# Patient Record
Sex: Female | Born: 1955
Health system: Southern US, Community
[De-identification: ages and names within clinical notes are randomized; demographics above are authoritative.]

## PROBLEM LIST (undated history)

## (undated) DIAGNOSIS — Z9289 Personal history of other medical treatment: Secondary | ICD-10-CM

## (undated) DIAGNOSIS — E039 Hypothyroidism, unspecified: Secondary | ICD-10-CM

## (undated) DIAGNOSIS — I10 Essential (primary) hypertension: Secondary | ICD-10-CM

## (undated) DIAGNOSIS — E079 Disorder of thyroid, unspecified: Secondary | ICD-10-CM

## (undated) DIAGNOSIS — M1990 Unspecified osteoarthritis, unspecified site: Secondary | ICD-10-CM

## (undated) DIAGNOSIS — C50919 Malignant neoplasm of unspecified site of unspecified female breast: Secondary | ICD-10-CM

## (undated) HISTORY — PX: ABDOMINAL HYSTERECTOMY: SHX81

## (undated) HISTORY — DX: Disorder of thyroid, unspecified: E07.9

## (undated) HISTORY — PX: THYROIDECTOMY: SHX17

## (undated) HISTORY — DX: Essential (primary) hypertension: I10

## (undated) HISTORY — PX: COLONOSCOPY: SHX174

---

## 2000-05-24 ENCOUNTER — Encounter: Payer: Self-pay | Admitting: Emergency Medicine

## 2000-05-24 ENCOUNTER — Ambulatory Visit (HOSPITAL_COMMUNITY): Admission: RE | Admit: 2000-05-24 | Discharge: 2000-05-24 | Payer: Self-pay | Admitting: Emergency Medicine

## 2001-06-14 ENCOUNTER — Ambulatory Visit (HOSPITAL_COMMUNITY): Admission: RE | Admit: 2001-06-14 | Discharge: 2001-06-14 | Payer: Self-pay | Admitting: Emergency Medicine

## 2001-06-14 ENCOUNTER — Encounter: Payer: Self-pay | Admitting: Emergency Medicine

## 2003-02-12 ENCOUNTER — Ambulatory Visit (HOSPITAL_COMMUNITY): Admission: RE | Admit: 2003-02-12 | Discharge: 2003-02-12 | Payer: Self-pay | Admitting: Emergency Medicine

## 2007-05-28 ENCOUNTER — Ambulatory Visit (HOSPITAL_COMMUNITY): Admission: RE | Admit: 2007-05-28 | Discharge: 2007-05-28 | Payer: Self-pay | Admitting: Emergency Medicine

## 2008-09-05 ENCOUNTER — Ambulatory Visit (HOSPITAL_COMMUNITY): Admission: RE | Admit: 2008-09-05 | Discharge: 2008-09-05 | Payer: Self-pay | Admitting: Obstetrics and Gynecology

## 2009-11-20 ENCOUNTER — Ambulatory Visit (HOSPITAL_COMMUNITY): Admission: RE | Admit: 2009-11-20 | Discharge: 2009-11-20 | Payer: Self-pay | Admitting: Internal Medicine

## 2009-12-07 ENCOUNTER — Encounter: Admission: RE | Admit: 2009-12-07 | Discharge: 2009-12-07 | Payer: Self-pay

## 2010-03-21 ENCOUNTER — Encounter: Payer: Self-pay | Admitting: Emergency Medicine

## 2011-09-21 ENCOUNTER — Other Ambulatory Visit (HOSPITAL_COMMUNITY): Payer: Self-pay | Admitting: Family Medicine

## 2011-09-21 DIAGNOSIS — Z1231 Encounter for screening mammogram for malignant neoplasm of breast: Secondary | ICD-10-CM

## 2011-10-13 ENCOUNTER — Ambulatory Visit (HOSPITAL_COMMUNITY): Payer: Self-pay

## 2011-10-20 ENCOUNTER — Ambulatory Visit (HOSPITAL_COMMUNITY)
Admission: RE | Admit: 2011-10-20 | Discharge: 2011-10-20 | Disposition: A | Discharge status: Home / Self Care | Payer: 59 | Source: Ambulatory Visit | Attending: Family Medicine | Admitting: Family Medicine

## 2011-10-20 DIAGNOSIS — Z1231 Encounter for screening mammogram for malignant neoplasm of breast: Secondary | ICD-10-CM | POA: Insufficient documentation

## 2012-09-10 ENCOUNTER — Other Ambulatory Visit (HOSPITAL_COMMUNITY): Payer: Self-pay | Admitting: Family Medicine

## 2012-09-10 DIAGNOSIS — Z1231 Encounter for screening mammogram for malignant neoplasm of breast: Secondary | ICD-10-CM

## 2012-10-22 ENCOUNTER — Ambulatory Visit (HOSPITAL_COMMUNITY)
Admission: RE | Admit: 2012-10-22 | Discharge: 2012-10-22 | Disposition: A | Discharge status: Home / Self Care | Payer: 59 | Source: Ambulatory Visit | Attending: Family Medicine | Admitting: Family Medicine

## 2012-10-22 DIAGNOSIS — Z1231 Encounter for screening mammogram for malignant neoplasm of breast: Secondary | ICD-10-CM | POA: Insufficient documentation

## 2013-11-21 ENCOUNTER — Other Ambulatory Visit (HOSPITAL_COMMUNITY): Payer: Self-pay | Admitting: Family Medicine

## 2013-11-21 DIAGNOSIS — Z780 Asymptomatic menopausal state: Secondary | ICD-10-CM

## 2013-11-21 DIAGNOSIS — Z1231 Encounter for screening mammogram for malignant neoplasm of breast: Secondary | ICD-10-CM

## 2013-11-28 ENCOUNTER — Ambulatory Visit (HOSPITAL_COMMUNITY)
Admission: RE | Admit: 2013-11-28 | Discharge: 2013-11-28 | Disposition: A | Discharge status: Home / Self Care | Payer: 59 | Source: Ambulatory Visit | Attending: Family Medicine | Admitting: Family Medicine

## 2013-11-28 DIAGNOSIS — Z780 Asymptomatic menopausal state: Secondary | ICD-10-CM | POA: Diagnosis not present

## 2013-11-28 DIAGNOSIS — Z13820 Encounter for screening for osteoporosis: Secondary | ICD-10-CM | POA: Insufficient documentation

## 2013-11-28 DIAGNOSIS — Z1231 Encounter for screening mammogram for malignant neoplasm of breast: Secondary | ICD-10-CM | POA: Diagnosis present

## 2014-08-07 ENCOUNTER — Other Ambulatory Visit: Payer: Self-pay | Admitting: Family Medicine

## 2014-11-24 ENCOUNTER — Encounter: Payer: Self-pay | Admitting: Family Medicine

## 2014-12-22 ENCOUNTER — Ambulatory Visit (INDEPENDENT_AMBULATORY_CARE_PROVIDER_SITE_OTHER): Payer: 59 | Admitting: Family Medicine

## 2014-12-22 ENCOUNTER — Other Ambulatory Visit: Payer: Self-pay

## 2014-12-22 ENCOUNTER — Encounter: Payer: Self-pay | Admitting: Family Medicine

## 2014-12-22 VITALS — BP 136/85 | HR 91 | Temp 99.2°F | Ht 64.5 in | Wt 283.6 lb

## 2014-12-22 DIAGNOSIS — E063 Autoimmune thyroiditis: Secondary | ICD-10-CM | POA: Insufficient documentation

## 2014-12-22 DIAGNOSIS — I10 Essential (primary) hypertension: Secondary | ICD-10-CM | POA: Diagnosis not present

## 2014-12-22 DIAGNOSIS — Z0000 Encounter for general adult medical examination without abnormal findings: Secondary | ICD-10-CM

## 2014-12-22 DIAGNOSIS — E039 Hypothyroidism, unspecified: Secondary | ICD-10-CM | POA: Diagnosis not present

## 2014-12-22 DIAGNOSIS — Z1231 Encounter for screening mammogram for malignant neoplasm of breast: Secondary | ICD-10-CM

## 2014-12-22 LAB — URINALYSIS, ROUTINE W REFLEX MICROSCOPIC
Bilirubin, UA: NEGATIVE
Glucose, UA: NEGATIVE
KETONES UA: NEGATIVE
Leukocytes, UA: NEGATIVE
NITRITE UA: NEGATIVE
Protein, UA: NEGATIVE
SPEC GRAV UA: 1.015 (ref 1.005–1.030)
Urobilinogen, Ur: 0.2 mg/dL (ref 0.2–1.0)
pH, UA: 7 (ref 5.0–7.5)

## 2014-12-22 LAB — MICROSCOPIC EXAMINATION: RBC MICROSCOPIC, UA: NONE SEEN /HPF (ref 0–?)

## 2014-12-22 MED ORDER — OLMESARTAN-AMLODIPINE-HCTZ 40-5-12.5 MG PO TABS: ORAL_TABLET | ORAL | Status: DC

## 2014-12-22 MED ORDER — MELOXICAM 15 MG PO TABS: ORAL_TABLET | ORAL | Status: DC

## 2014-12-22 MED ORDER — LEVOTHYROXINE SODIUM 75 MCG PO TABS: 75.0000 ug | ORAL_TABLET | Freq: Every day | ORAL | Status: DC

## 2014-12-22 NOTE — Progress Notes (Signed)
BP 136/85 mmHg  Pulse 91  Temp(Src) 99.2 F (37.3 C)  Ht 5' 4.5" (1.638 m)  Wt 283 lb 9.6 oz (128.64 kg)  BMI 47.95 kg/m2  SpO2 96%   Subjective:    Patient ID: Kerry Phillips, female    DOB: [birth year], 59 y.o.   MRN: [id number]  HPI: Kerry Phillips is a 59 y.o. female  Chief Complaint  Patient presents with  . Annual Exam   patient doing well with no complaints taking medications without problems blood pressure doing well  Relevant past medical, surgical, family and social history reviewed and updated as indicated. Interim medical history since our last visit reviewed. Allergies and medications reviewed and updated.  Review of Systems  Constitutional: Negative.   HENT: Negative.   Eyes: Negative.   Respiratory: Negative.   Cardiovascular: Negative.   Gastrointestinal: Negative.   Endocrine: Negative.   Genitourinary: Negative.   Musculoskeletal: Negative.   Skin: Negative.   Allergic/Immunologic: Negative.   Neurological: Negative.   Hematological: Negative.   Psychiatric/Behavioral: Negative.     Per HPI unless specifically indicated above     Objective:    BP 136/85 mmHg  Pulse 91  Temp(Src) 99.2 F (37.3 C)  Ht 5' 4.5" (1.638 m)  Wt 283 lb 9.6 oz (128.64 kg)  BMI 47.95 kg/m2  SpO2 96%  Wt Readings from Last 3 Encounters:  12/22/14 283 lb 9.6 oz (128.64 kg)  05/29/14 264 lb (119.75 kg)    Physical Exam  Constitutional: She is oriented to person, place, and time. She appears well-developed and well-nourished.  HENT:  Head: Normocephalic and atraumatic.  Right Ear: External ear normal.  Left Ear: External ear normal.  Nose: Nose normal.  Mouth/Throat: Oropharynx is clear and moist.  Eyes: Conjunctivae and EOM are normal. Pupils are equal, round, and reactive to light.  Neck: Normal range of motion. Neck supple. Carotid bruit is not present.  Cardiovascular: Normal rate, regular rhythm and normal heart sounds.   No murmur heard. Pulmonary/Chest:  Effort normal and breath sounds normal. She exhibits no mass. Right breast exhibits no mass, no skin change and no tenderness. Left breast exhibits no mass, no skin change and no tenderness. Breasts are symmetrical.  Abdominal: Soft. Bowel sounds are normal. There is no hepatosplenomegaly.  Musculoskeletal: Normal range of motion.  Neurological: She is alert and oriented to person, place, and time.  Skin: No rash noted.  Psychiatric: She has a normal mood and affect. Her behavior is normal. Judgment and thought content normal.    No results found for this or any previous visit.    Assessment & Plan:   Problem List Items Addressed This Visit      Cardiovascular and Mediastinum   Essential hypertension    The current medical regimen is effective;  continue present plan and medications.       Relevant Medications   Olmesartan-Amlodipine-HCTZ (TRIBENZOR) 40-5-12.5 MG TABS     Endocrine   Hypothyroidism    The current medical regimen is effective;  continue present plan and medications.       Relevant Medications   levothyroxine (SYNTHROID, LEVOTHROID) 75 MCG tablet    Other Visit Diagnoses    Routine general medical examination at a health care facility    -  Primary    Relevant Orders    CBC with Differential/Platelet    Comprehensive metabolic panel    Lipid Panel w/o Chol/HDL Ratio    TSH    Urinalysis, Routine  w reflex microscopic (not at [facility])    PE (physical exam), annual            Follow up plan: Return in about 6 months (around 06/22/2015), or if symptoms worsen or fail to improve, for 6 mo BMP.

## 2014-12-22 NOTE — Assessment & Plan Note (Signed)
The current medical regimen is effective;  continue present plan and medications.  

## 2014-12-23 LAB — COMPREHENSIVE METABOLIC PANEL
ALBUMIN: 4.3 g/dL (ref 3.6–4.8)
ALK PHOS: 46 IU/L (ref 39–117)
ALT: 11 IU/L (ref 0–32)
AST: 14 IU/L (ref 0–40)
Albumin/Globulin Ratio: 1.8 (ref 1.1–2.5)
BUN/Creatinine Ratio: 16 (ref 11–26)
BUN: 15 mg/dL (ref 8–27)
Bilirubin Total: 0.3 mg/dL (ref 0.0–1.2)
CALCIUM: 8.9 mg/dL (ref 8.7–10.3)
CO2: 25 mmol/L (ref 18–29)
CREATININE: 0.91 mg/dL (ref 0.57–1.00)
Chloride: 100 mmol/L (ref 97–106)
GFR calc Af Amer: 79 mL/min/{1.73_m2} (ref >59)
GFR calc non Af Amer: 69 mL/min/{1.73_m2} (ref >59)
GLUCOSE: 86 mg/dL (ref 65–99)
Globulin, Total: 2.4 g/dL (ref 1.5–4.5)
Potassium: 4.2 mmol/L (ref 3.5–5.2)
Sodium: 143 mmol/L (ref 136–144)
Total Protein: 6.7 g/dL (ref 6.0–8.5)

## 2014-12-23 LAB — CBC WITH DIFFERENTIAL/PLATELET
BASOS ABS: 0 10*3/uL (ref 0.0–0.2)
BASOS: 0 %
EOS (ABSOLUTE): 0.2 10*3/uL (ref 0.0–0.4)
EOS: 3 %
HEMOGLOBIN: 11.9 g/dL (ref 11.1–15.9)
Hematocrit: 35.6 % (ref 34.0–46.6)
IMMATURE GRANULOCYTES: 0 %
Immature Grans (Abs): 0 10*3/uL (ref 0.0–0.1)
Lymphocytes Absolute: 1.5 10*3/uL (ref 0.7–3.1)
Lymphs: 25 %
MCH: 28.7 pg (ref 26.6–33.0)
MCHC: 33.4 g/dL (ref 31.5–35.7)
MCV: 86 fL (ref 79–97)
Monocytes Absolute: 0.3 10*3/uL (ref 0.1–0.9)
Monocytes: 6 %
NEUTROS ABS: 3.9 10*3/uL (ref 1.4–7.0)
Neutrophils: 66 %
Platelets: 283 10*3/uL (ref 150–379)
RBC: 4.15 x10E6/uL (ref 3.77–5.28)
RDW: 15.2 % (ref 12.3–15.4)
WBC: 5.9 10*3/uL (ref 3.4–10.8)

## 2014-12-23 LAB — LIPID PANEL W/O CHOL/HDL RATIO
CHOLESTEROL TOTAL: 197 mg/dL (ref 100–199)
HDL: 63 mg/dL (ref >39)
LDL CALC: 122 mg/dL — AB (ref 0–99)
TRIGLYCERIDES: 61 mg/dL (ref 0–149)
VLDL CHOLESTEROL CAL: 12 mg/dL (ref 5–40)

## 2014-12-23 LAB — TSH: TSH: 5.79 u[IU]/mL — ABNORMAL HIGH (ref 0.450–4.500)

## 2014-12-25 ENCOUNTER — Telehealth: Payer: Self-pay | Admitting: Family Medicine

## 2014-12-25 DIAGNOSIS — R7989 Other specified abnormal findings of blood chemistry: Secondary | ICD-10-CM

## 2014-12-25 NOTE — Telephone Encounter (Signed)
Phone call Discussed with patient slight elevation of TSH will recheck TSH 1 month or so Patient with no thyroid symptoms

## 2014-12-25 NOTE — Telephone Encounter (Signed)
-----   Message from Lurlean HornsNancy H Wilson, CMA sent at 12/25/2014  5:19 PM EDT ----- labs

## 2015-01-07 ENCOUNTER — Ambulatory Visit
Admission: RE | Admit: 2015-01-07 | Discharge: 2015-01-07 | Disposition: A | Discharge status: Home / Self Care | Payer: 59 | Source: Ambulatory Visit

## 2015-01-07 DIAGNOSIS — Z1231 Encounter for screening mammogram for malignant neoplasm of breast: Secondary | ICD-10-CM

## 2015-02-16 ENCOUNTER — Other Ambulatory Visit: Payer: Self-pay | Admitting: Family Medicine

## 2015-06-08 MED FILL — LEVOTHYROXINE 75 MCG TABLET: 75 | 90 days supply | Qty: 90 | Fill #1

## 2015-06-08 MED FILL — CALCITRIOL 0.5 MCG CAPSULE: 0.5 | 90 days supply | Qty: 180 | Fill #1

## 2015-06-08 MED FILL — OLMSRTN-AMLDPN-HCTZ 40-5-12: 40-5-12.5 | 90 days supply | Qty: 90 | Fill #0

## 2015-06-22 ENCOUNTER — Ambulatory Visit: Payer: 59 | Admitting: Family Medicine

## 2015-07-28 ENCOUNTER — Encounter: Payer: Self-pay | Admitting: Family Medicine

## 2015-07-28 ENCOUNTER — Ambulatory Visit (INDEPENDENT_AMBULATORY_CARE_PROVIDER_SITE_OTHER): Payer: 59 | Admitting: Family Medicine

## 2015-07-28 VITALS — BP 131/85 | HR 94 | Temp 97.7°F | Ht 64.6 in | Wt 277.0 lb

## 2015-07-28 DIAGNOSIS — M545 Low back pain, unspecified: Secondary | ICD-10-CM

## 2015-07-28 DIAGNOSIS — I10 Essential (primary) hypertension: Secondary | ICD-10-CM | POA: Diagnosis not present

## 2015-07-28 DIAGNOSIS — E039 Hypothyroidism, unspecified: Secondary | ICD-10-CM

## 2015-07-28 DIAGNOSIS — R946 Abnormal results of thyroid function studies: Secondary | ICD-10-CM | POA: Diagnosis not present

## 2015-07-28 DIAGNOSIS — R7989 Other specified abnormal findings of blood chemistry: Secondary | ICD-10-CM

## 2015-07-28 NOTE — Progress Notes (Signed)
BP 131/85 mmHg  Pulse 94  Temp(Src) 97.7 F (36.5 C)  Ht 5' 4.6" (1.641 m)  Wt 277 lb (125.646 kg)  BMI 46.66 kg/m2  SpO2 99%   Subjective:    Patient ID: Kerry Phillips, female    DOB: 12-14-1955, 60 y.o.   MRN: [id number]  HPI: Kerry Phillips is a 60 y.o. female  Chief Complaint  Patient presents with  . Hypothyroidism  . Hypertension    all in all doing well taking troponins or without problems no side effects and good blood pressure control. Stable get medication at a reasonable price. Taking thyroid medicines without problems every day  Relevant past medical, surgical, family and social history reviewed and updated as indicated. Interim medical history since our last visit reviewed. Allergies and medications reviewed and updated.  Review of Systems  Constitutional: Negative.   Respiratory: Negative.   Cardiovascular: Negative.   Musculoskeletal:       Has some low back stiffness especially with standing or on feet all day. Goes away with rest or changing activities. Maybe some slight radiation down into left leg otherwise clear no blood in stool or urine.    Per HPI unless specifically indicated above     Objective:    BP 131/85 mmHg  Pulse 94  Temp(Src) 97.7 F (36.5 C)  Ht 5' 4.6" (1.641 m)  Wt 277 lb (125.646 kg)  BMI 46.66 kg/m2  SpO2 99%  Wt Readings from Last 3 Encounters:  07/28/15 277 lb (125.646 kg)  12/22/14 283 lb 9.6 oz (128.64 kg)  05/29/14 264 lb (119.75 kg)    Physical Exam  Constitutional: She is oriented to person, place, and time. She appears well-developed and well-nourished. No distress.  HENT:  Head: Normocephalic and atraumatic.  Right Ear: Hearing normal.  Left Ear: Hearing normal.  Nose: Nose normal.  Eyes: Conjunctivae and lids are normal. Right eye exhibits no discharge. Left eye exhibits no discharge. No scleral icterus.  Cardiovascular: Normal rate, regular rhythm and normal heart sounds.   Pulmonary/Chest: Effort normal  and breath sounds normal. No respiratory distress.  Musculoskeletal: Normal range of motion.  Back exam normal with normal neurological exam and strength of legs  Neurological: She is alert and oriented to person, place, and time.  Skin: Skin is intact. No rash noted.  Psychiatric: She has a normal mood and affect. Her speech is normal and behavior is normal. Judgment and thought content normal. Cognition and memory are normal.    Results for orders placed or performed in visit on 12/22/14  Microscopic Examination  Result Value Ref Range   WBC, UA 0-5 0 -  5 /hpf   RBC, UA None seen 0 -  2 /hpf   Epithelial Cells (non renal) 0-10 0 - 10 /hpf   Bacteria, UA Few None seen/Few  CBC with Differential/Platelet  Result Value Ref Range   WBC 5.9 3.4 - 10.8 x10E3/uL   RBC 4.15 3.77 - 5.28 x10E6/uL   Hemoglobin 11.9 11.1 - 15.9 g/dL   Hematocrit 82.9 56.2 - 46.6 %   MCV 86 79 - 97 fL   MCH 28.7 26.6 - 33.0 pg   MCHC 33.4 31.5 - 35.7 g/dL   RDW 13.0 86.5 - 78.4 %   Platelets 283 150 - 379 x10E3/uL   Neutrophils 66 %   Lymphs 25 %   Monocytes 6 %   Eos 3 %   Basos 0 %   Neutrophils Absolute 3.9 1.4 -  7.0 x10E3/uL   Lymphocytes Absolute 1.5 0.7 - 3.1 x10E3/uL   Monocytes Absolute 0.3 0.1 - 0.9 x10E3/uL   EOS (ABSOLUTE) 0.2 0.0 - 0.4 x10E3/uL   Basophils Absolute 0.0 0.0 - 0.2 x10E3/uL   Immature Granulocytes 0 %   Immature Grans (Abs) 0.0 0.0 - 0.1 x10E3/uL  Comprehensive metabolic panel  Result Value Ref Range   Glucose 86 65 - 99 mg/dL   BUN 15 8 - 27 mg/dL   Creatinine, Ser 1.610.91 0.57 - 1.00 mg/dL   GFR calc non Af Amer 69 >59 mL/min/1.73   GFR calc Af Amer 79 >59 mL/min/1.73   BUN/Creatinine Ratio 16 11 - 26   Sodium 143 136 - 144 mmol/L   Potassium 4.2 3.5 - 5.2 mmol/L   Chloride 100 97 - 106 mmol/L   CO2 25 18 - 29 mmol/L   Calcium 8.9 8.7 - 10.3 mg/dL   Total Protein 6.7 6.0 - 8.5 g/dL   Albumin 4.3 3.6 - 4.8 g/dL   Globulin, Total 2.4 1.5 - 4.5 g/dL   Albumin/Globulin  Ratio 1.8 1.1 - 2.5   Bilirubin Total 0.3 0.0 - 1.2 mg/dL   Alkaline Phosphatase 46 39 - 117 IU/L   AST 14 0 - 40 IU/L   ALT 11 0 - 32 IU/L  Lipid Panel w/o Chol/HDL Ratio  Result Value Ref Range   Cholesterol, Total 197 100 - 199 mg/dL   Triglycerides 61 0 - 149 mg/dL   HDL 63 >09>39 mg/dL   VLDL Cholesterol Cal 12 5 - 40 mg/dL   LDL Calculated 604122 (H) 0 - 99 mg/dL  TSH  Result Value Ref Range   TSH 5.790 (H) 0.450 - 4.500 uIU/mL  Urinalysis, Routine w reflex microscopic (not at [facility])  Result Value Ref Range   Specific Gravity, UA 1.015 1.005 - 1.030   pH, UA 7.0 5.0 - 7.5   Color, UA Yellow Yellow   Appearance Ur Clear Clear   Leukocytes, UA Negative Negative   Protein, UA Negative Negative/Trace   Glucose, UA Negative Negative   Ketones, UA Negative Negative   RBC, UA Trace (A) Negative   Bilirubin, UA Negative Negative   Urobilinogen, Ur 0.2 0.2 - 1.0 mg/dL   Nitrite, UA Negative Negative   Microscopic Examination See below:       Assessment & Plan:   Problem List Items Addressed This Visit      Cardiovascular and Mediastinum   Essential hypertension    The current medical regimen is effective;  continue present plan and medications.       Relevant Orders   Basic metabolic panel     Endocrine   Hypothyroidism - Primary    The current medical regimen is effective;  continue present plan and medications.        Other Visit Diagnoses    Elevated TSH        Bilateral low back pain without sciatica        Discuss back pain care and treatment exercises movement maybe occasional ibuprofen        Follow up plan: Return in about 6 months (around 01/28/2016) for Physical Exam.

## 2015-07-28 NOTE — Assessment & Plan Note (Signed)
The current medical regimen is effective;  continue present plan and medications.  

## 2015-07-29 ENCOUNTER — Encounter: Payer: Self-pay | Admitting: Family Medicine

## 2015-07-29 LAB — BASIC METABOLIC PANEL
BUN / CREAT RATIO: 15 (ref 12–28)
BUN: 14 mg/dL (ref 8–27)
CHLORIDE: 99 mmol/L (ref 96–106)
CO2: 26 mmol/L (ref 18–29)
CREATININE: 0.94 mg/dL (ref 0.57–1.00)
Calcium: 8.9 mg/dL (ref 8.7–10.3)
GFR, EST AFRICAN AMERICAN: 76 mL/min/{1.73_m2} (ref >59)
GFR, EST NON AFRICAN AMERICAN: 66 mL/min/{1.73_m2} (ref >59)
Glucose: 84 mg/dL (ref 65–99)
Potassium: 4 mmol/L (ref 3.5–5.2)
Sodium: 141 mmol/L (ref 134–144)

## 2015-07-29 LAB — TSH: TSH: 3.98 u[IU]/mL (ref 0.450–4.500)

## 2015-09-15 ENCOUNTER — Other Ambulatory Visit: Payer: Self-pay | Admitting: Family Medicine

## 2015-09-15 MED FILL — LEVOTHYROXINE 75 MCG TABLET: 75 | 90 days supply | Qty: 90 | Fill #2

## 2015-09-15 MED FILL — OLMSRTN-AMLDPN-HCTZ 40-5-12: 40-5-12.5 | 90 days supply | Qty: 90 | Fill #1 | Status: TO

## 2015-09-15 MED FILL — CALCITRIOL 0.5 MCG CAPSULE: 0.5 | 90 days supply | Qty: 180 | Fill #0

## 2015-11-17 ENCOUNTER — Encounter (INDEPENDENT_AMBULATORY_CARE_PROVIDER_SITE_OTHER): Payer: Self-pay

## 2015-12-10 ENCOUNTER — Encounter: Payer: 59 | Admitting: Family Medicine

## 2015-12-18 MED FILL — CALCITRIOL 0.5 MCG CAPSULE: 0.5 | 90 days supply | Qty: 180 | Fill #1

## 2015-12-18 MED FILL — LEVOTHYROXINE 75 MCG TABLET: 75 | 90 days supply | Qty: 90 | Fill #3

## 2015-12-18 MED FILL — OLMSRTN-AMLDPN-HCTZ 40-5-12: 40-5-12.5 | 90 days supply | Qty: 90 | Fill #0

## 2016-01-19 ENCOUNTER — Encounter: Payer: Self-pay | Admitting: Family Medicine

## 2016-03-21 ENCOUNTER — Encounter: Payer: 59 | Admitting: Family Medicine

## 2016-03-22 ENCOUNTER — Other Ambulatory Visit: Payer: Self-pay | Admitting: Family Medicine

## 2016-03-22 DIAGNOSIS — Z1231 Encounter for screening mammogram for malignant neoplasm of breast: Secondary | ICD-10-CM

## 2016-03-30 ENCOUNTER — Other Ambulatory Visit: Payer: Self-pay

## 2016-03-30 MED ORDER — OLMESARTAN-AMLODIPINE-HCTZ 40-5-12.5 MG PO TABS: ORAL_TABLET | ORAL | 0 refills | Status: DC

## 2016-03-30 MED ORDER — LEVOTHYROXINE SODIUM 75 MCG PO TABS: 75.0000 ug | ORAL_TABLET | Freq: Every day | ORAL | 4 refills | Status: DC

## 2016-03-30 MED FILL — OLMSRTN-AMLDPN-HCTZ 40-5-12: 40-5-12.5 | 90 days supply | Qty: 90 | Fill #0

## 2016-03-30 MED FILL — CALCITRIOL 0.5 MCG CAPSULE: 0.5 | 90 days supply | Qty: 180 | Fill #2

## 2016-03-30 MED FILL — LEVOTHYROXINE 75 MCG TABLET: 75 | 90 days supply | Qty: 90 | Fill #0

## 2016-03-30 NOTE — Telephone Encounter (Signed)
Last OV: 07/28/15 Next OV: 04/05/16  Lab Results  Component Value Date   TSH 3.980 07/28/2015    BMP Latest Ref Rng & Units 07/28/2015 12/22/2014  Glucose 65 - 99 mg/dL 84 86  BUN 8 - 27 mg/dL 14 15  Creatinine 1.610.57 - 1.00 mg/dL 0.960.94 0.450.91  BUN/Creat Ratio 12 - 28 15 16   Sodium 134 - 144 mmol/L 141 143  Potassium 3.5 - 5.2 mmol/L 4.0 4.2  Chloride 96 - 106 mmol/L 99 100  CO2 18 - 29 mmol/L 26 25  Calcium 8.7 - 10.3 mg/dL 8.9 8.9

## 2016-04-05 ENCOUNTER — Ambulatory Visit (INDEPENDENT_AMBULATORY_CARE_PROVIDER_SITE_OTHER): Payer: 59 | Admitting: Family Medicine

## 2016-04-05 ENCOUNTER — Encounter: Payer: Self-pay | Admitting: Family Medicine

## 2016-04-05 VITALS — BP 138/70 | HR 90 | Ht 63.39 in | Wt 290.0 lb

## 2016-04-05 DIAGNOSIS — Z13220 Encounter for screening for lipoid disorders: Secondary | ICD-10-CM

## 2016-04-05 DIAGNOSIS — Z0000 Encounter for general adult medical examination without abnormal findings: Secondary | ICD-10-CM | POA: Diagnosis not present

## 2016-04-05 DIAGNOSIS — Z1159 Encounter for screening for other viral diseases: Secondary | ICD-10-CM | POA: Diagnosis not present

## 2016-04-05 DIAGNOSIS — M170 Bilateral primary osteoarthritis of knee: Secondary | ICD-10-CM | POA: Diagnosis not present

## 2016-04-05 DIAGNOSIS — Z114 Encounter for screening for human immunodeficiency virus [HIV]: Secondary | ICD-10-CM

## 2016-04-05 DIAGNOSIS — Z6841 Body Mass Index (BMI) 40.0 and over, adult: Secondary | ICD-10-CM | POA: Insufficient documentation

## 2016-04-05 DIAGNOSIS — I10 Essential (primary) hypertension: Secondary | ICD-10-CM

## 2016-04-05 DIAGNOSIS — Z23 Encounter for immunization: Secondary | ICD-10-CM

## 2016-04-05 DIAGNOSIS — E039 Hypothyroidism, unspecified: Secondary | ICD-10-CM

## 2016-04-05 LAB — URINALYSIS, ROUTINE W REFLEX MICROSCOPIC
Bilirubin, UA: NEGATIVE
Glucose, UA: NEGATIVE
Ketones, UA: NEGATIVE
LEUKOCYTES UA: NEGATIVE
NITRITE UA: NEGATIVE
PH UA: 7 (ref 5.0–7.5)
Protein, UA: NEGATIVE
RBC UA: NEGATIVE
SPEC GRAV UA: 1.015 (ref 1.005–1.030)
Urobilinogen, Ur: 0.2 mg/dL (ref 0.2–1.0)

## 2016-04-05 MED ORDER — OLMESARTAN-AMLODIPINE-HCTZ 40-5-12.5 MG PO TABS: ORAL_TABLET | ORAL | 4 refills | Status: DC

## 2016-04-05 NOTE — Assessment & Plan Note (Signed)
Discussed care and treatment of arthritis use of medications and weight loss

## 2016-04-05 NOTE — Progress Notes (Signed)
BP 138/70 (BP Location: Left Arm)   Pulse 90   Ht 5' 3.39" (1.61 m)   Wt 290 lb (131.5 kg)   SpO2 98%   BMI 50.75 kg/m    Subjective:    Patient ID: Kerry Phillips, female    DOB: 09-28-1955, 61 y.o.   MRN: [id number]  HPI: Kerry Phillips is a 61 y.o. female  Chief Complaint  Patient presents with  . Annual Exam  Patient all in all doing well has knee arthritis wondering about the special need clinics and whether they may or may not help. Patient taking thyroid medication faithfully without problems Blood pressure doing well without issues and takes medication without side effects and faithfully.  Relevant past medical, surgical, family and social history reviewed and updated as indicated. Interim medical history since our last visit reviewed. Allergies and medications reviewed and updated.  Review of Systems  Constitutional: Negative.   HENT: Negative.   Eyes: Negative.   Respiratory: Negative.   Cardiovascular: Negative.   Gastrointestinal: Negative.   Endocrine: Negative.   Genitourinary: Negative.   Musculoskeletal: Negative.   Skin: Negative.   Allergic/Immunologic: Negative.   Neurological: Negative.   Hematological: Negative.   Psychiatric/Behavioral: Negative.     Per HPI unless specifically indicated above     Objective:    BP 138/70 (BP Location: Left Arm)   Pulse 90   Ht 5' 3.39" (1.61 m)   Wt 290 lb (131.5 kg)   SpO2 98%   BMI 50.75 kg/m   Wt Readings from Last 3 Encounters:  04/05/16 290 lb (131.5 kg)  07/28/15 277 lb (125.6 kg)  12/22/14 283 lb 9.6 oz (128.6 kg)    Physical Exam  Constitutional: She is oriented to person, place, and time. She appears well-developed and well-nourished.  HENT:  Head: Normocephalic and atraumatic.  Right Ear: External ear normal.  Left Ear: External ear normal.  Nose: Nose normal.  Mouth/Throat: Oropharynx is clear and moist.  Eyes: Conjunctivae and EOM are normal. Pupils are equal, round, and reactive to  light.  Neck: Normal range of motion. Neck supple. Carotid bruit is not present.  Cardiovascular: Normal rate, regular rhythm and normal heart sounds.   No murmur heard. Pulmonary/Chest: Effort normal and breath sounds normal. She exhibits no mass. Right breast exhibits no mass, no skin change and no tenderness. Left breast exhibits no mass, no skin change and no tenderness. Breasts are symmetrical.  Abdominal: Soft. Bowel sounds are normal. There is no hepatosplenomegaly.  Musculoskeletal: Normal range of motion.  Neurological: She is alert and oriented to person, place, and time.  Skin: No rash noted.  Psychiatric: She has a normal mood and affect. Her behavior is normal. Judgment and thought content normal.        Assessment & Plan:   Problem List Items Addressed This Visit      Cardiovascular and Mediastinum   Essential hypertension   Relevant Orders   CBC with Differential/Platelet   Comprehensive metabolic panel   Urinalysis, Routine w reflex microscopic     Endocrine   Hypothyroidism   Relevant Orders   CBC with Differential/Platelet   Comprehensive metabolic panel   TSH   Urinalysis, Routine w reflex microscopic     Musculoskeletal and Integument   Arthritis of both knees    Discussed care and treatment of arthritis use of medications and weight loss        Other   BMI 50.0-59.9, adult (HCC)  Discussed diet exercise nutrition importance of weight loss       Other Visit Diagnoses    Annual physical exam    -  Primary   Relevant Orders   CBC with Differential/Platelet   Comprehensive metabolic panel   Hepatitis C antibody   HIV antibody   Lipid panel   TSH   Urinalysis, Routine w reflex microscopic   Need for hepatitis C screening test       Relevant Orders   Hepatitis C antibody   Encounter for screening for HIV       Relevant Orders   HIV antibody   Screening cholesterol level       Relevant Orders   Lipid panel   Need for Zostavax  administration       Need for influenza vaccination           Follow up plan: Return for BMP.

## 2016-04-05 NOTE — Assessment & Plan Note (Signed)
Discussed diet exercise nutrition importance of weight loss

## 2016-04-06 ENCOUNTER — Telehealth: Payer: Self-pay | Admitting: Family Medicine

## 2016-04-06 DIAGNOSIS — R7989 Other specified abnormal findings of blood chemistry: Secondary | ICD-10-CM

## 2016-04-06 LAB — CBC WITH DIFFERENTIAL/PLATELET
BASOS: 1 %
Basophils Absolute: 0 10*3/uL (ref 0.0–0.2)
EOS (ABSOLUTE): 0.2 10*3/uL (ref 0.0–0.4)
EOS: 2 %
HEMATOCRIT: 35.5 % (ref 34.0–46.6)
Hemoglobin: 12.1 g/dL (ref 11.1–15.9)
IMMATURE GRANS (ABS): 0 10*3/uL (ref 0.0–0.1)
Immature Granulocytes: 0 %
LYMPHS: 26 %
Lymphocytes Absolute: 1.7 10*3/uL (ref 0.7–3.1)
MCH: 27.8 pg (ref 26.6–33.0)
MCHC: 34.1 g/dL (ref 31.5–35.7)
MCV: 82 fL (ref 79–97)
Monocytes Absolute: 0.4 10*3/uL (ref 0.1–0.9)
Monocytes: 5 %
NEUTROS ABS: 4.4 10*3/uL (ref 1.4–7.0)
Neutrophils: 66 %
PLATELETS: 331 10*3/uL (ref 150–379)
RBC: 4.35 x10E6/uL (ref 3.77–5.28)
RDW: 15 % (ref 12.3–15.4)
WBC: 6.7 10*3/uL (ref 3.4–10.8)

## 2016-04-06 LAB — COMPREHENSIVE METABOLIC PANEL
A/G RATIO: 1.8 (ref 1.2–2.2)
ALBUMIN: 4.6 g/dL (ref 3.6–4.8)
ALT: 10 IU/L (ref 0–32)
AST: 12 IU/L (ref 0–40)
Alkaline Phosphatase: 53 IU/L (ref 39–117)
BILIRUBIN TOTAL: 0.3 mg/dL (ref 0.0–1.2)
BUN / CREAT RATIO: 17 (ref 12–28)
BUN: 16 mg/dL (ref 8–27)
CALCIUM: 9 mg/dL (ref 8.7–10.3)
CHLORIDE: 100 mmol/L (ref 96–106)
CO2: 26 mmol/L (ref 18–29)
Creatinine, Ser: 0.96 mg/dL (ref 0.57–1.00)
GFR, EST AFRICAN AMERICAN: 74 mL/min/{1.73_m2} (ref >59)
GFR, EST NON AFRICAN AMERICAN: 64 mL/min/{1.73_m2} (ref >59)
GLOBULIN, TOTAL: 2.5 g/dL (ref 1.5–4.5)
Glucose: 100 mg/dL — ABNORMAL HIGH (ref 65–99)
POTASSIUM: 4.1 mmol/L (ref 3.5–5.2)
SODIUM: 143 mmol/L (ref 134–144)
TOTAL PROTEIN: 7.1 g/dL (ref 6.0–8.5)

## 2016-04-06 LAB — HEPATITIS C ANTIBODY: Hep C Virus Ab: <0.1 s/co ratio (ref 0.0–0.9)

## 2016-04-06 LAB — LIPID PANEL
CHOL/HDL RATIO: 3.8 ratio (ref 0.0–4.4)
Cholesterol, Total: 215 mg/dL — ABNORMAL HIGH (ref 100–199)
HDL: 57 mg/dL (ref >39)
LDL CALC: 142 mg/dL — AB (ref 0–99)
Triglycerides: 82 mg/dL (ref 0–149)
VLDL CHOLESTEROL CAL: 16 mg/dL (ref 5–40)

## 2016-04-06 LAB — HIV ANTIBODY (ROUTINE TESTING W REFLEX): HIV Screen 4th Generation wRfx: NONREACTIVE

## 2016-04-06 LAB — TSH: TSH: 8.83 u[IU]/mL — AB (ref 0.450–4.500)

## 2016-04-06 NOTE — Telephone Encounter (Signed)
Phone call Discussed with weight loss cholesterol will get better. Discussed TSH elevated will recheck again in a couple of months.

## 2016-04-28 ENCOUNTER — Ambulatory Visit: Payer: 59

## 2016-05-20 ENCOUNTER — Ambulatory Visit
Admission: RE | Admit: 2016-05-20 | Discharge: 2016-05-20 | Disposition: A | Discharge status: Home / Self Care | Payer: 59 | Source: Ambulatory Visit | Attending: Family Medicine | Admitting: Family Medicine

## 2016-05-20 DIAGNOSIS — Z1231 Encounter for screening mammogram for malignant neoplasm of breast: Secondary | ICD-10-CM | POA: Diagnosis not present

## 2016-07-08 MED FILL — LEVOTHYROXINE 75 MCG TABLET: 75 | 90 days supply | Qty: 90 | Fill #1

## 2016-07-08 MED FILL — OLMSRTN-AMLDPN-HCTZ 40-5-12: 40-5-12.5 | 90 days supply | Qty: 90 | Fill #0

## 2016-07-08 MED FILL — CALCITRIOL 0.5 MCG CAPSULE: 0.5 | 90 days supply | Qty: 180 | Fill #3

## 2016-09-09 ENCOUNTER — Encounter: Payer: Self-pay | Admitting: Family Medicine

## 2016-10-04 ENCOUNTER — Ambulatory Visit: Payer: 59 | Admitting: Family Medicine

## 2016-10-10 MED FILL — OLMSRTN-AMLDPN-HCTZ 40-5-12: 40-5-12.5 | 30 days supply | Qty: 30 | Fill #1

## 2016-11-16 ENCOUNTER — Other Ambulatory Visit: Payer: Self-pay | Admitting: Family Medicine

## 2016-11-16 MED FILL — OLMSRTN-AMLDPN-HCTZ 40-5-12: 40-5-12.5 | 30 days supply | Qty: 30 | Fill #2

## 2016-11-16 MED FILL — CALCITRIOL 0.5 MCG CAPSULE: 0.5 | 90 days supply | Qty: 180 | Fill #0

## 2016-11-16 MED FILL — LEVOTHYROXINE 75 MCG TABLET: 75 | 90 days supply | Qty: 90 | Fill #2

## 2016-12-12 ENCOUNTER — Ambulatory Visit: Payer: 59 | Admitting: Family Medicine

## 2016-12-14 MED FILL — OLMSRTN-AMLDPN-HCTZ 40-5-12: 40-5-12.5 | 30 days supply | Qty: 30 | Fill #3

## 2016-12-29 ENCOUNTER — Ambulatory Visit: Payer: 59 | Admitting: Family Medicine

## 2017-01-12 MED FILL — OLMSRTN-AMLDPN-HCTZ 40-5-12: 40-5-12.5 | 30 days supply | Qty: 30 | Fill #4

## 2017-01-17 ENCOUNTER — Ambulatory Visit (INDEPENDENT_AMBULATORY_CARE_PROVIDER_SITE_OTHER): Payer: 59 | Admitting: Family Medicine

## 2017-01-17 ENCOUNTER — Encounter: Payer: Self-pay | Admitting: Family Medicine

## 2017-01-17 VITALS — BP 110/80 | HR 90 | Wt 284.0 lb

## 2017-01-17 DIAGNOSIS — R1310 Dysphagia, unspecified: Secondary | ICD-10-CM

## 2017-01-17 DIAGNOSIS — I10 Essential (primary) hypertension: Secondary | ICD-10-CM

## 2017-01-17 DIAGNOSIS — E039 Hypothyroidism, unspecified: Secondary | ICD-10-CM

## 2017-01-17 HISTORY — DX: Dysphagia, unspecified: R13.10

## 2017-01-17 MED ORDER — OMEPRAZOLE 20 MG PO CPDR: 20.0000 mg | DELAYED_RELEASE_CAPSULE | Freq: Every day | ORAL | 6 refills | Status: DC

## 2017-01-17 MED FILL — OMEPRAZOLE 20 MG CAP: 20 | 30 days supply | Qty: 30 | Fill #0

## 2017-01-17 NOTE — Assessment & Plan Note (Signed)
The current medical regimen is effective;  continue present plan and medications.  

## 2017-01-17 NOTE — Assessment & Plan Note (Signed)
Discussed dysphagia will try Prilosec first if no help May consider further evaluation.

## 2017-01-17 NOTE — Progress Notes (Signed)
BP 110/80   Pulse 90   Wt 284 lb (128.8 kg)   SpO2 99%   BMI 49.70 kg/m    Subjective:    Patient ID: Kerry BrooksBenita J Dasch, female    DOB: 01-06-1956, 61 y.o.   MRN: [id number]  HPI: Kerry Phillips is a 61 y.o. female  Chief Complaint  Patient presents with  . Follow-up  . Dysphagia  patient follow-up having some dysphasia  Symptoms mostly with water but also has noticed with other foods. Nothing seems to get stuck and can swallow okay but just some discomfort associated. Patient hasn't tried Prilosec or other reflux type medications. Takes her thyroid medication and blood pressure medication every day without problems. No noticed blood no cough  Relevant past medical, surgical, family and social history reviewed and updated as indicated. Interim medical history since our last visit reviewed. Allergies and medications reviewed and updated.  Review of Systems  Constitutional: Negative.   Respiratory: Negative.   Cardiovascular: Negative.     Per HPI unless specifically indicated above     Objective:    BP 110/80   Pulse 90   Wt 284 lb (128.8 kg)   SpO2 99%   BMI 49.70 kg/m   Wt Readings from Last 3 Encounters:  01/17/17 284 lb (128.8 kg)  04/05/16 290 lb (131.5 kg)  07/28/15 277 lb (125.6 kg)    Physical Exam  Constitutional: She is oriented to person, place, and time. She appears well-developed and well-nourished.  HENT:  Head: Normocephalic and atraumatic.  Eyes: Conjunctivae and EOM are normal.  Neck: Normal range of motion.  Cardiovascular: Normal rate, regular rhythm and normal heart sounds.  Pulmonary/Chest: Effort normal and breath sounds normal.  Musculoskeletal: Normal range of motion.  Neurological: She is alert and oriented to person, place, and time.  Skin: No erythema.  Psychiatric: She has a normal mood and affect. Her behavior is normal. Judgment and thought content normal.    Results for orders placed or performed in visit on 04/05/16  CBC with  Differential/Platelet  Result Value Ref Range   WBC 6.7 3.4 - 10.8 x10E3/uL   RBC 4.35 3.77 - 5.28 x10E6/uL   Hemoglobin 12.1 11.1 - 15.9 g/dL   Hematocrit 72.535.5 36.634.0 - 46.6 %   MCV 82 79 - 97 fL   MCH 27.8 26.6 - 33.0 pg   MCHC 34.1 31.5 - 35.7 g/dL   RDW 44.015.0 34.712.3 - 42.515.4 %   Platelets 331 150 - 379 x10E3/uL   Neutrophils 66 Not Estab. %   Lymphs 26 Not Estab. %   Monocytes 5 Not Estab. %   Eos 2 Not Estab. %   Basos 1 Not Estab. %   Neutrophils Absolute 4.4 1.4 - 7.0 x10E3/uL   Lymphocytes Absolute 1.7 0.7 - 3.1 x10E3/uL   Monocytes Absolute 0.4 0.1 - 0.9 x10E3/uL   EOS (ABSOLUTE) 0.2 0.0 - 0.4 x10E3/uL   Basophils Absolute 0.0 0.0 - 0.2 x10E3/uL   Immature Granulocytes 0 Not Estab. %   Immature Grans (Abs) 0.0 0.0 - 0.1 x10E3/uL  Comprehensive metabolic panel  Result Value Ref Range   Glucose 100 (H) 65 - 99 mg/dL   BUN 16 8 - 27 mg/dL   Creatinine, Ser 9.560.96 0.57 - 1.00 mg/dL   GFR calc non Af Amer 64 >59 mL/min/1.73   GFR calc Af Amer 74 >59 mL/min/1.73   BUN/Creatinine Ratio 17 12 - 28   Sodium 143 134 - 144 mmol/L  Potassium 4.1 3.5 - 5.2 mmol/L   Chloride 100 96 - 106 mmol/L   CO2 26 18 - 29 mmol/L   Calcium 9.0 8.7 - 10.3 mg/dL   Total Protein 7.1 6.0 - 8.5 g/dL   Albumin 4.6 3.6 - 4.8 g/dL   Globulin, Total 2.5 1.5 - 4.5 g/dL   Albumin/Globulin Ratio 1.8 1.2 - 2.2   Bilirubin Total 0.3 0.0 - 1.2 mg/dL   Alkaline Phosphatase 53 39 - 117 IU/L   AST 12 0 - 40 IU/L   ALT 10 0 - 32 IU/L  Hepatitis C antibody  Result Value Ref Range   Hep C Virus Ab <0.1 0.0 - 0.9 s/co ratio  HIV antibody  Result Value Ref Range   HIV Screen 4th Generation wRfx Non Reactive Non Reactive  Lipid panel  Result Value Ref Range   Cholesterol, Total 215 (H) 100 - 199 mg/dL   Triglycerides 82 0 - 149 mg/dL   HDL 57 >16>39 mg/dL   VLDL Cholesterol Cal 16 5 - 40 mg/dL   LDL Calculated 109142 (H) 0 - 99 mg/dL   Chol/HDL Ratio 3.8 0.0 - 4.4 ratio units  TSH  Result Value Ref Range   TSH  8.830 (H) 0.450 - 4.500 uIU/mL  Urinalysis, Routine w reflex microscopic  Result Value Ref Range   Specific Gravity, UA 1.015 1.005 - 1.030   pH, UA 7.0 5.0 - 7.5   Color, UA Yellow Yellow   Appearance Ur Clear Clear   Leukocytes, UA Negative Negative   Protein, UA Negative Negative/Trace   Glucose, UA Negative Negative   Ketones, UA Negative Negative   RBC, UA Negative Negative   Bilirubin, UA Negative Negative   Urobilinogen, Ur 0.2 0.2 - 1.0 mg/dL   Nitrite, UA Negative Negative      Assessment & Plan:   Problem List Items Addressed This Visit      Cardiovascular and Mediastinum   Essential hypertension - Primary    The current medical regimen is effective;  continue present plan and medications.       Relevant Orders   Basic metabolic panel     Digestive   Dysphagia    Discussed dysphagia will try Prilosec first if no help May consider further evaluation.        Endocrine   Hypothyroidism    TSH pending his last TSH slightly elevated may need to adjust dosing          Follow up plan: Return in about 3 months (around 04/19/2017) for Physical Exam.

## 2017-01-17 NOTE — Assessment & Plan Note (Signed)
TSH pending his last TSH slightly elevated may need to adjust dosing

## 2017-01-18 LAB — BASIC METABOLIC PANEL
BUN / CREAT RATIO: 15 (ref 12–28)
BUN: 15 mg/dL (ref 8–27)
CHLORIDE: 101 mmol/L (ref 96–106)
CO2: 26 mmol/L (ref 20–29)
CREATININE: 1 mg/dL (ref 0.57–1.00)
Calcium: 8.7 mg/dL (ref 8.7–10.3)
GFR calc Af Amer: 70 mL/min/{1.73_m2} (ref >59)
GFR calc non Af Amer: 61 mL/min/{1.73_m2} (ref >59)
GLUCOSE: 78 mg/dL (ref 65–99)
POTASSIUM: 3.3 mmol/L — AB (ref 3.5–5.2)
SODIUM: 141 mmol/L (ref 134–144)

## 2017-01-24 ENCOUNTER — Telehealth: Payer: Self-pay | Admitting: Family Medicine

## 2017-01-24 DIAGNOSIS — E876 Hypokalemia: Secondary | ICD-10-CM

## 2017-01-24 DIAGNOSIS — E039 Hypothyroidism, unspecified: Secondary | ICD-10-CM

## 2017-01-24 MED ORDER — LEVOTHYROXINE SODIUM 88 MCG PO TABS: 88.0000 ug | ORAL_TABLET | Freq: Every day | ORAL | 2 refills | Status: DC

## 2017-01-24 MED FILL — LEVOTHYROXINE 88 MCG TABLET: 88 | 30 days supply | Qty: 30 | Fill #0

## 2017-01-24 NOTE — Telephone Encounter (Signed)
Phone call Discussed with patient TSH elevated. Will increase levothyroxine from 75-88 g recheck TSH in a couple of months. Discuss potassium low patient will increase potassium rich foods recheck BMP and a month or so.

## 2017-01-25 LAB — TSH: TSH: 4.96 u[IU]/mL — ABNORMAL HIGH (ref 0.450–4.500)

## 2017-01-25 LAB — SPECIMEN STATUS REPORT

## 2017-02-16 MED FILL — OLMSRTN-AMLDPN-HCTZ 40-5-12: 40-5-12.5 | 30 days supply | Qty: 30 | Fill #5

## 2017-02-16 MED FILL — CALCITRIOL 0.5 MCG CAPS: 0.5 | 90 days supply | Qty: 180 | Fill #1

## 2017-03-22 MED FILL — LEVOTHYROXINE 88 MCG TABLET: 88 | 30 days supply | Qty: 30 | Fill #1

## 2017-03-22 MED FILL — OLMSRTN-AMLDPN-HCTZ 40-5-12: 40-5-12.5 | 30 days supply | Qty: 30 | Fill #6

## 2017-03-22 MED FILL — OMEPRAZOLE 20 MG CAP: 20 | 30 days supply | Qty: 30 | Fill #1

## 2017-04-06 ENCOUNTER — Encounter: Payer: Self-pay | Admitting: Family Medicine

## 2017-04-26 ENCOUNTER — Other Ambulatory Visit: Payer: Self-pay | Admitting: Family Medicine

## 2017-04-26 MED FILL — LEVOTHYROXINE 88 MCG TABLET: 88 | 30 days supply | Qty: 30 | Fill #2

## 2017-04-26 MED FILL — OMEPRAZOLE 20 MG CAP: 20 | 30 days supply | Qty: 30 | Fill #2

## 2017-04-28 MED FILL — OLMSRTN-AMLDPN-HCTZ 40-5-12: 40-5-12.5 | 30 days supply | Qty: 30 | Fill #0

## 2017-04-28 NOTE — Telephone Encounter (Signed)
Patient is checking on the status of this since Dr Dossie Arbourrissman is out of the office and she just wants to make sure she gets it before the weekend  [facility] [place] - [id number]

## 2017-05-02 ENCOUNTER — Encounter: Payer: 59 | Admitting: Family Medicine

## 2017-05-30 ENCOUNTER — Other Ambulatory Visit: Payer: Self-pay | Admitting: Family Medicine

## 2017-05-30 MED FILL — OMEPRAZOLE 20 MG CAP: 20 | 30 days supply | Qty: 30 | Fill #3

## 2017-05-30 MED FILL — OLMSRTN-AMLDPN-HCTZ 40-5-12: 40-5-12.5 | 30 days supply | Qty: 30 | Fill #1

## 2017-05-30 MED FILL — CALCITRIOL 0.5 MCG CAPS: 0.5 | 90 days supply | Qty: 180 | Fill #2

## 2017-05-31 MED FILL — LEVOTHYROXINE 88 MCG TABLET: 88 | 30 days supply | Qty: 30 | Fill #0

## 2017-06-06 ENCOUNTER — Encounter: Payer: 59 | Admitting: Family Medicine

## 2017-06-19 ENCOUNTER — Encounter: Payer: 59 | Admitting: Family Medicine

## 2017-06-28 MED FILL — OMEPRAZOLE 20 MG CAP: 20 | 30 days supply | Qty: 30 | Fill #4

## 2017-06-28 MED FILL — LEVOTHYROXINE 88 MCG TABLET: 88 | 30 days supply | Qty: 30 | Fill #1

## 2017-06-28 MED FILL — OLMSRTN-AMLDPN-HCTZ 40-5-12: 40-5-12.5 | 30 days supply | Qty: 30 | Fill #2

## 2017-07-27 MED FILL — OMEPRAZOLE 20 MG CAP: 20 | 30 days supply | Qty: 30 | Fill #5

## 2017-07-27 MED FILL — LEVOTHYROXINE 88 MCG TABLET: 88 | 30 days supply | Qty: 30 | Fill #2

## 2017-07-27 MED FILL — OLMSRTN-AMLDPN-HCTZ 40-5-12: 40-5-12.5 | 30 days supply | Qty: 30 | Fill #3

## 2017-08-24 ENCOUNTER — Other Ambulatory Visit: Payer: Self-pay | Admitting: Family Medicine

## 2017-08-24 MED FILL — CALCITRIOL 0.5 MCG CAPS: 0.5 | 90 days supply | Qty: 180 | Fill #3

## 2017-08-24 MED FILL — OLMSRTN-AMLDPN-HCTZ 40-5-12: 40-5-12.5 | 30 days supply | Qty: 30 | Fill #4

## 2017-08-24 MED FILL — OMEPRAZOLE 20 MG CAP: 20 | 30 days supply | Qty: 30 | Fill #6

## 2017-08-28 MED FILL — LEVOTHYROXINE 88 MCG TABLET: 88 | 30 days supply | Qty: 30 | Fill #0

## 2017-09-05 MED FILL — CHLORHEXIDINE 0.12% RINSE: 0.12 | 14 days supply | Qty: 473 | Fill #0

## 2017-09-05 MED FILL — IBUPROFEN 400 MG TABS: 400 | 5 days supply | Qty: 30 | Fill #0

## 2017-09-05 MED FILL — HYDROCODON-APAP 5-325: 5-325 | 1 days supply | Qty: 5 | Fill #0

## 2017-09-07 ENCOUNTER — Other Ambulatory Visit: Payer: Self-pay

## 2017-09-07 ENCOUNTER — Ambulatory Visit: Payer: 59 | Admitting: Family Medicine

## 2017-09-07 ENCOUNTER — Encounter

## 2017-09-07 ENCOUNTER — Encounter: Payer: Self-pay | Admitting: Family Medicine

## 2017-09-07 VITALS — BP 118/78 | HR 83 | Temp 97.4°F | Ht 64.5 in | Wt 285.5 lb

## 2017-09-07 DIAGNOSIS — E876 Hypokalemia: Secondary | ICD-10-CM | POA: Diagnosis not present

## 2017-09-07 DIAGNOSIS — R7989 Other specified abnormal findings of blood chemistry: Secondary | ICD-10-CM

## 2017-09-07 DIAGNOSIS — Z0000 Encounter for general adult medical examination without abnormal findings: Secondary | ICD-10-CM

## 2017-09-07 DIAGNOSIS — Z13220 Encounter for screening for lipoid disorders: Secondary | ICD-10-CM

## 2017-09-07 DIAGNOSIS — E039 Hypothyroidism, unspecified: Secondary | ICD-10-CM | POA: Diagnosis not present

## 2017-09-07 DIAGNOSIS — I10 Essential (primary) hypertension: Secondary | ICD-10-CM

## 2017-09-07 DIAGNOSIS — Z6841 Body Mass Index (BMI) 40.0 and over, adult: Secondary | ICD-10-CM

## 2017-09-07 DIAGNOSIS — R1310 Dysphagia, unspecified: Secondary | ICD-10-CM | POA: Diagnosis not present

## 2017-09-07 DIAGNOSIS — Z125 Encounter for screening for malignant neoplasm of prostate: Secondary | ICD-10-CM | POA: Diagnosis not present

## 2017-09-07 LAB — URINALYSIS, ROUTINE W REFLEX MICROSCOPIC
Bilirubin, UA: NEGATIVE
Glucose, UA: NEGATIVE
KETONES UA: NEGATIVE
LEUKOCYTES UA: NEGATIVE
Nitrite, UA: NEGATIVE
Protein, UA: NEGATIVE
RBC, UA: NEGATIVE
Specific Gravity, UA: 1.02 (ref 1.005–1.030)
UUROB: 0.2 mg/dL (ref 0.2–1.0)
pH, UA: 6 (ref 5.0–7.5)

## 2017-09-07 MED ORDER — OLMESARTAN-AMLODIPINE-HCTZ 40-5-12.5 MG PO TABS: ORAL_TABLET | ORAL | 4 refills | Status: DC

## 2017-09-07 MED ORDER — OMEPRAZOLE 20 MG PO CPDR: 20.0000 mg | DELAYED_RELEASE_CAPSULE | Freq: Every day | ORAL | 12 refills | Status: DC

## 2017-09-07 MED ORDER — LEVOTHYROXINE SODIUM 88 MCG PO TABS: 88.0000 ug | ORAL_TABLET | Freq: Every day | ORAL | 12 refills | Status: DC

## 2017-09-07 MED ORDER — CALCITRIOL 0.5 MCG PO CAPS: 0.5000 ug | ORAL_CAPSULE | Freq: Two times a day (BID) | ORAL | 12 refills | Status: DC

## 2017-09-07 NOTE — Progress Notes (Signed)
BP 118/78   Pulse 83   Temp (!) 97.4 F (36.3 C) (Tympanic)   Ht 5' 4.5" (1.638 m)   Wt 285 lb 8 oz (129.5 kg)   SpO2 98%   BMI 48.25 kg/m    Subjective:    Patient ID: Kerry Phillips, female    DOB: 10-Sep-1955, 62 y.o.   MRN: [id number]  HPI: Kerry Phillips is a 62 y.o. female  Chief Complaint  Patient presents with  . Annual Exam  Patient follow-up all in all doing well no complaints reviewed nerve questions all negative. Taking thyroid without problems and faithfully Blood pressure doing well with good controlled and no issues with medications. Reflux also doing well without problems.  Reviewed dysplasia complaints from last visit which have resolved by taking Prilosec. Also taking Calcitrol without problems.  Relevant past medical, surgical, family and social history reviewed and updated as indicated. Interim medical history since our last visit reviewed. Allergies and medications reviewed and updated.  Review of Systems  Constitutional: Negative.   HENT: Negative.   Eyes: Negative.   Respiratory: Negative.   Cardiovascular: Negative.   Gastrointestinal: Negative.   Endocrine: Negative.   Genitourinary: Negative.   Musculoskeletal: Negative.   Skin: Negative.   Allergic/Immunologic: Negative.   Neurological: Negative.   Hematological: Negative.   Psychiatric/Behavioral: Negative.     Per HPI unless specifically indicated above     Objective:    BP 118/78   Pulse 83   Temp (!) 97.4 F (36.3 C) (Tympanic)   Ht 5' 4.5" (1.638 m)   Wt 285 lb 8 oz (129.5 kg)   SpO2 98%   BMI 48.25 kg/m   Wt Readings from Last 3 Encounters:  09/07/17 285 lb 8 oz (129.5 kg)  01/17/17 284 lb (128.8 kg)  04/05/16 290 lb (131.5 kg)    Physical Exam  Constitutional: She is oriented to person, place, and time. She appears well-developed and well-nourished.  HENT:  Head: Normocephalic and atraumatic.  Right Ear: External ear normal.  Left Ear: External ear normal.  Nose:  Nose normal.  Mouth/Throat: Oropharynx is clear and moist.  Eyes: Pupils are equal, round, and reactive to light. Conjunctivae and EOM are normal.  Neck: Normal range of motion. Neck supple. Carotid bruit is not present.  Cardiovascular: Normal rate, regular rhythm and normal heart sounds.  No murmur heard. Pulmonary/Chest: Effort normal and breath sounds normal. She exhibits no mass. Right breast exhibits no mass, no skin change and no tenderness. Left breast exhibits no mass, no skin change and no tenderness. Breasts are symmetrical.  Abdominal: Soft. Bowel sounds are normal. There is no hepatosplenomegaly.  Musculoskeletal: Normal range of motion.  Neurological: She is alert and oriented to person, place, and time.  Skin: No rash noted.  Psychiatric: She has a normal mood and affect. Her behavior is normal. Judgment and thought content normal.    Results for orders placed or performed in visit on 01/17/17  Basic metabolic panel  Result Value Ref Range   Glucose 78 65 - 99 mg/dL   BUN 15 8 - 27 mg/dL   Creatinine, Ser 4.09 0.57 - 1.00 mg/dL   GFR calc non Af Amer 61 >59 mL/min/1.73   GFR calc Af Amer 70 >59 mL/min/1.73   BUN/Creatinine Ratio 15 12 - 28   Sodium 141 134 - 144 mmol/L   Potassium 3.3 (L) 3.5 - 5.2 mmol/L   Chloride 101 96 - 106 mmol/L   CO2 26  20 - 29 mmol/L   Calcium 8.7 8.7 - 10.3 mg/dL  TSH  Result Value Ref Range   TSH 4.960 (H) 0.450 - 4.500 uIU/mL  Specimen status report  Result Value Ref Range   specimen status report Comment       Assessment & Plan:   Problem List Items Addressed This Visit      Cardiovascular and Mediastinum   Essential hypertension - Primary    The current medical regimen is effective;  continue present plan and medications.       Relevant Medications   Olmesartan-amLODIPine-HCTZ 40-5-12.5 MG TABS   Other Relevant Orders   CBC with Differential/Platelet   Comprehensive metabolic panel   Lipid panel   Urinalysis, Routine w  reflex microscopic     Digestive   Dysphagia    The current medical regimen is effective;  continue present plan and medications.         Endocrine   Hypothyroidism    The current medical regimen is effective;  continue present plan and medications.       Relevant Medications   levothyroxine (SYNTHROID, LEVOTHROID) 88 MCG tablet   Other Relevant Orders   TSH     Other   BMI 50.0-59.9, adult (HCC)    Discussed wt loss diet nutrition       Hypocalcemia    The current medical regimen is effective;  continue present plan and medications.        Other Visit Diagnoses    Hypokalemia       Relevant Orders   Comprehensive metabolic panel   Elevated TSH       Annual physical exam       Screening cholesterol level       Relevant Orders   Lipid panel   Prostate cancer screening       Relevant Orders   PSA       Follow up plan: Return in about 6 months (around 03/10/2018) for BMP.

## 2017-09-07 NOTE — Assessment & Plan Note (Signed)
Discussed wt loss diet nutrition

## 2017-09-07 NOTE — Addendum Note (Signed)
Addended by: Vonita MossRISSMAN, Panhia Karl A on: 09/07/2017 11:11 AM   Modules accepted: Orders

## 2017-09-07 NOTE — Assessment & Plan Note (Signed)
The current medical regimen is effective;  continue present plan and medications.  

## 2017-09-08 LAB — COMPREHENSIVE METABOLIC PANEL
ALT: 9 IU/L (ref 0–32)
AST: 13 IU/L (ref 0–40)
Albumin/Globulin Ratio: 1.6 (ref 1.2–2.2)
Albumin: 4.2 g/dL (ref 3.6–4.8)
Alkaline Phosphatase: 49 IU/L (ref 39–117)
BUN/Creatinine Ratio: 17 (ref 12–28)
BUN: 18 mg/dL (ref 8–27)
Bilirubin Total: 0.3 mg/dL (ref 0.0–1.2)
CALCIUM: 8.4 mg/dL — AB (ref 8.7–10.3)
CO2: 29 mmol/L (ref 20–29)
CREATININE: 1.09 mg/dL — AB (ref 0.57–1.00)
Chloride: 99 mmol/L (ref 96–106)
GFR calc Af Amer: 63 mL/min/{1.73_m2} (ref >59)
GFR, EST NON AFRICAN AMERICAN: 55 mL/min/{1.73_m2} — AB (ref >59)
Globulin, Total: 2.6 g/dL (ref 1.5–4.5)
Glucose: 81 mg/dL (ref 65–99)
Potassium: 3.8 mmol/L (ref 3.5–5.2)
Sodium: 142 mmol/L (ref 134–144)
Total Protein: 6.8 g/dL (ref 6.0–8.5)

## 2017-09-08 LAB — CBC WITH DIFFERENTIAL/PLATELET
BASOS: 0 %
Basophils Absolute: 0 10*3/uL (ref 0.0–0.2)
EOS (ABSOLUTE): 0.1 10*3/uL (ref 0.0–0.4)
Eos: 2 %
Hematocrit: 34.7 % (ref 34.0–46.6)
Hemoglobin: 11.4 g/dL (ref 11.1–15.9)
IMMATURE GRANULOCYTES: 0 %
Immature Grans (Abs): 0 10*3/uL (ref 0.0–0.1)
Lymphocytes Absolute: 2.2 10*3/uL (ref 0.7–3.1)
Lymphs: 30 %
MCH: 27.8 pg (ref 26.6–33.0)
MCHC: 32.9 g/dL (ref 31.5–35.7)
MCV: 85 fL (ref 79–97)
MONOS ABS: 0.5 10*3/uL (ref 0.1–0.9)
Monocytes: 6 %
NEUTROS PCT: 62 %
Neutrophils Absolute: 4.4 10*3/uL (ref 1.4–7.0)
Platelets: 315 10*3/uL (ref 150–450)
RBC: 4.1 x10E6/uL (ref 3.77–5.28)
RDW: 15.1 % (ref 12.3–15.4)
WBC: 7.2 10*3/uL (ref 3.4–10.8)

## 2017-09-08 LAB — TSH: TSH: 8.37 u[IU]/mL — AB (ref 0.450–4.500)

## 2017-09-08 LAB — LIPID PANEL
CHOL/HDL RATIO: 4.3 ratio (ref 0.0–4.4)
Cholesterol, Total: 215 mg/dL — ABNORMAL HIGH (ref 100–199)
HDL: 50 mg/dL (ref >39)
LDL CALC: 135 mg/dL — AB (ref 0–99)
TRIGLYCERIDES: 151 mg/dL — AB (ref 0–149)
VLDL Cholesterol Cal: 30 mg/dL (ref 5–40)

## 2017-09-13 ENCOUNTER — Telehealth: Payer: Self-pay | Admitting: Family Medicine

## 2017-09-13 DIAGNOSIS — R7989 Other specified abnormal findings of blood chemistry: Secondary | ICD-10-CM

## 2017-09-13 NOTE — Telephone Encounter (Signed)
Phone call discussed with patient elevated TSH patient with no thyroid type symptoms we will repeat TSH 2 months.

## 2017-09-18 MED FILL — OLMSRTN-AMLDPN-HCTZ 40-5-12: 40-5-12.5 | 30 days supply | Qty: 30 | Fill #0

## 2017-09-18 MED FILL — OMEPRAZOLE 20 MG CAP: 20 | 30 days supply | Qty: 30 | Fill #0

## 2017-09-25 ENCOUNTER — Encounter (INDEPENDENT_AMBULATORY_CARE_PROVIDER_SITE_OTHER): Payer: Self-pay | Admitting: Orthopedic Surgery

## 2017-09-25 ENCOUNTER — Ambulatory Visit (INDEPENDENT_AMBULATORY_CARE_PROVIDER_SITE_OTHER): Payer: 59

## 2017-09-25 ENCOUNTER — Ambulatory Visit (INDEPENDENT_AMBULATORY_CARE_PROVIDER_SITE_OTHER): Payer: 59 | Admitting: Orthopedic Surgery

## 2017-09-25 VITALS — Ht 64.0 in | Wt 285.0 lb

## 2017-09-25 DIAGNOSIS — M9262 Juvenile osteochondrosis of tarsus, left ankle: Secondary | ICD-10-CM

## 2017-09-25 DIAGNOSIS — M79672 Pain in left foot: Secondary | ICD-10-CM | POA: Diagnosis not present

## 2017-09-25 DIAGNOSIS — M79671 Pain in right foot: Secondary | ICD-10-CM

## 2017-09-25 DIAGNOSIS — M9261 Juvenile osteochondrosis of tarsus, right ankle: Secondary | ICD-10-CM

## 2017-09-25 NOTE — Progress Notes (Signed)
Office Visit Note   Patient: Kerry BrooksBenita J Meadors           Date of Birth: 29-Apr-1955           MRN: [id number] Visit Date: 09/25/2017              Requested by: Steele Sizerrissman, Mark A, MD [address] [place], [place] [id number] PCP: Steele Sizerrissman, Mark A, MD  Chief Complaint  Patient presents with  . Left Foot - Pain  . Right Foot - Pain      HPI: Patient is a 62 year old woman who presents with chronic heel pain bilaterally at the insertion of the Achilles.  She states she has pain all day long with weightbearing denies any swelling she also complains of pain over the base of the fifth metatarsal left foot.  Assessment & Plan: Visit Diagnoses:  1. Pain in right foot   2. Pain in left foot   3. Haglund's deformity of both heels     Plan: Recommended Achilles stretching 5 times a day a minute at a time recommended heel lifts recommended CBD lotion.  Discussed that if she fails conservative therapy we could consider endoscopic resection of the Haglund's deformity.  Discussed that she would be off her foot for about 2 months.  Follow-Up Instructions: Return if symptoms worsen or fail to improve.   Ortho Exam  Patient is alert, oriented, no adenopathy, well-dressed, normal affect, normal respiratory effort. Examination patient has a good dorsalis pedis pulse bilaterally she does have Achilles contracture with dorsiflexion only to neutral with her knee extended.  There is no redness no cellulitis no nodular changes no palpable defects.  No clinical signs of gout  Imaging: Xr Foot 2 Views Left  Result Date: 09/25/2017 2 view radiographs of the left foot no acute fractures there is a large calcaneal heel spur at the insertion of the Achilles.  Xr Foot 2 Views Right  Result Date: 09/25/2017 2 view radiographs of the right foot shows a large calcaneal heel spur at the insertion of the Achilles.  No other bony abnormalities of the foot.  No images are attached to the encounter.  Labs: No  results found for: HGBA1C, ESRSEDRATE, CRP, LABURIC, REPTSTATUS, GRAMSTAIN, CULT, LABORGA   Lab Results  Component Value Date   ALBUMIN 4.2 09/07/2017   ALBUMIN 4.6 04/05/2016   ALBUMIN 4.3 12/22/2014    Body mass index is 48.92 kg/m.  Orders:  Orders Placed This Encounter  Procedures  . XR Foot 2 Views Right  . XR Foot 2 Views Left   No orders of the defined types were placed in this encounter.    Procedures: No procedures performed  Clinical Data: No additional findings.  ROS:  All other systems negative, except as noted in the HPI. Review of Systems  Objective: Vital Signs: Ht 5\' 4"  (1.626 m)   Wt 285 lb (129.3 kg)   BMI 48.92 kg/m   Specialty Comments:  No specialty comments available.  PMFS History: Patient Active Problem List   Diagnosis Date Noted  . Hypocalcemia 09/07/2017  . Dysphagia 01/17/2017  . BMI 50.0-59.9, adult (HCC) 04/05/2016  . Arthritis of both knees 04/05/2016  . Essential hypertension 12/22/2014  . Hypothyroidism 12/22/2014   History reviewed. No pertinent past medical history.  Family History  Problem Relation Age of Onset  . Hypertension Mother   . Cancer Father        Lung  . Stroke Sister   . Hypertension Sister   .  Hypertension Daughter   . Hypertension Son   . Lupus Sister     Past Surgical History:  Procedure Laterality Date  . ABDOMINAL HYSTERECTOMY    . COLONOSCOPY    . THYROIDECTOMY     90% removed   Social History   Occupational History  . Not on file  Tobacco Use  . Smoking status: Never Smoker  . Smokeless tobacco: Never Used  Substance and Sexual Activity  . Alcohol use: No  . Drug use: No  . Sexual activity: Yes

## 2017-09-26 MED FILL — LEVOTHYROXINE 88 MCG TABLET: 88 | 30 days supply | Qty: 30 | Fill #1

## 2017-09-29 MED FILL — AMOXICILLIN 500 MG CAPSULE: 500 | 10 days supply | Qty: 30 | Fill #0

## 2017-10-03 ENCOUNTER — Encounter: Payer: Self-pay | Admitting: Skilled Nursing Facility1

## 2017-10-03 ENCOUNTER — Encounter: Payer: 59 | Attending: Family Medicine | Admitting: Skilled Nursing Facility1

## 2017-10-03 DIAGNOSIS — Z6841 Body Mass Index (BMI) 40.0 and over, adult: Secondary | ICD-10-CM

## 2017-10-03 NOTE — Progress Notes (Signed)
  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  Pt states she is here to complete her insurance premium visits.   Pt states she wants to work on exercising more but her heel spurs have slowed her down. Pt states she has had 5 deaths in her family which may contribute to late night snacking. Pt states she does chair exercises and stair stepper and bike and treadmill. Pt states her goal is to work on losing 10 pounds at a time aiming for 40 pounds and eventually 100 pounds. Pt states she wants to be active 5 days a week. Pt statse she gets busy and does  Not remember to eat. Pt states she sleeps well.   Pt states she does not need any help and already knows what she needs to do to meet her goals. Pt is not open to education and just wants lower insurance premiums.    MEDICATIONS: See List   DIETARY INTAKE:  Usual eating pattern includes 1 meals and 4+ snacks per day.  Everyday foods include none stated.  Avoided foods include none stated.    24-hr recall: 1 meal a day B ( AM):  Snk ( AM): fruit L ( PM):  Snk ( PM): cheese D ( PM): meat and vegetable  Snk ( PM):  Beverages: water and flavored water   Usual physical activity: ADL's  Estimated energy needs: 1600 calories 180 g carbohydrates 120 g protein 44 g fat  Progress Towards Goal(s):  In progress.     Intervention:  Nutrition counseling.  Teaching Method Utilized:  Visual Auditory Hands on  Barriers to learning/adherence to lifestyle change: disinterested   Demonstrated degree of understanding via:  Teach Back   Monitoring/Evaluation:  Dietary intake, exercise,and body weight prn.

## 2017-10-11 ENCOUNTER — Ambulatory Visit: Payer: 59 | Admitting: Skilled Nursing Facility1

## 2017-10-18 ENCOUNTER — Encounter: Payer: 59 | Admitting: Skilled Nursing Facility1

## 2017-10-18 ENCOUNTER — Encounter: Payer: Self-pay | Admitting: Skilled Nursing Facility1

## 2017-10-18 DIAGNOSIS — E639 Nutritional deficiency, unspecified: Secondary | ICD-10-CM

## 2017-10-18 NOTE — Progress Notes (Signed)
  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  Pt states she is here to complete her insurance premium visits.   Pt states she wants to work on exercising more but her heel spurs have slowed her down. Pt states she has had 5 deaths in her family which may contribute to late night snacking. Pt states she does chair exercises and stair stepper and bike and treadmill. Pt states her goal is to work on losing 10 pounds at a time aiming for 40 pounds and eventually 100 pounds. Pt states she wants to be active 5 days a week. Pt statse she gets busy and does  Not remember to eat. Pt states she sleeps well.   Pt states she does not need any help and already knows what she needs to do to meet her goals. Pt is not open to education and just wants lower insurance premiums.   Pt state she is feeling really good about being more active. Pt states her psychical tells her she is doing really great.   MEDICATIONS: See List   DIETARY INTAKE:  Usual eating pattern includes 1 meals and 4+ snacks per day.  Everyday foods include none stated.  Avoided foods include none stated.    24-hr recall: 1 meal a day B ( AM): grits and fruit Snk ( AM): fruit L ( PM): skipped Snk ( PM): cheese D ( PM): steak, sweet potato and salad  Snk ( PM): popcorn Beverages: water and flavored water, juice   Usual physical activity: walking and seated exercise: aiming to do 7 days   Estimated energy needs: 1600 calories 180 g carbohydrates 120 g protein 44 g fat  Progress Towards Goal(s):  In progress.     Intervention:  Nutrition counseling.  Teaching Method Utilized:  Visual Auditory Hands on  Barriers to learning/adherence to lifestyle change: disinterested   Demonstrated degree of understanding via:  Teach Back   Monitoring/Evaluation:  Dietary intake, exercise,and body weight prn.

## 2017-10-25 ENCOUNTER — Encounter: Payer: Self-pay | Admitting: Skilled Nursing Facility1

## 2017-10-25 ENCOUNTER — Encounter: Payer: 59 | Admitting: Skilled Nursing Facility1

## 2017-10-25 DIAGNOSIS — E639 Nutritional deficiency, unspecified: Secondary | ICD-10-CM

## 2017-10-25 NOTE — Progress Notes (Signed)
  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  Pt states she is here to complete her insurance premium visits.   Pt states she wants to work on exercising more but her heel spurs have slowed her down. Pt states she has had 5 deaths in her family which may contribute to late night snacking. Pt states she does chair exercises and stair stepper and bike and treadmill. Pt states her goal is to work on losing 10 pounds at a time aiming for 40 pounds and eventually 100 pounds. Pt states she wants to be active 5 days a week. Pt statse she gets busy and does  Not remember to eat. Pt states she sleeps well.   Pt states she does not need any help and already knows what she needs to do to meet her goals. Pt is not open to education and just wants lower insurance premiums.   Pt state she is feeling really good about being more active. Pt states her psychical tells her she is doing really great.   Pt arrives for her final apportionment. Pt staet sh eis still really happy with ehr physical acitivyt.    MEDICATIONS: See List   DIETARY INTAKE:  Usual eating pattern includes 1 meals and 4+ snacks per day.  Everyday foods include none stated.  Avoided foods include none stated.    24-hr recall: 1 meal a day B ( AM): grits and fruit Snk ( AM): fruit L ( PM): skipped Snk ( PM): cheese D ( PM): steak, sweet potato and salad  Snk ( PM): popcorn Beverages: water and flavored water, juice   Usual physical activity: walking and seated exercise: aiming to do 7 days   Estimated energy needs: 1600 calories 180 g carbohydrates 120 g protein 44 g fat  Progress Towards Goal(s):  In progress.     Intervention:  Nutrition counseling.  Teaching Method Utilized:  Visual Auditory Hands on  Barriers to learning/adherence to lifestyle change: disinterested   Demonstrated degree of understanding via:  Teach Back   Monitoring/Evaluation:  Dietary intake, exercise,and body weight prn.

## 2017-10-27 MED FILL — OMEPRAZOLE 20 MG CAP: 20 | 30 days supply | Qty: 30 | Fill #1

## 2017-10-27 MED FILL — LEVOTHYROXINE 88 MCG TABLET: 88 | 30 days supply | Qty: 30 | Fill #2

## 2017-10-27 MED FILL — OLMSRTN-AMLDPN-HCTZ 40-5-12: 40-5-12.5 | 30 days supply | Qty: 30 | Fill #1

## 2017-11-20 MED FILL — CALCITRIOL 0.5 MCG CAPS: 0.5 | 30 days supply | Qty: 60 | Fill #0

## 2017-11-20 MED FILL — LEVOTHYROXINE 88 MCG TABLET: 88 | 30 days supply | Qty: 30 | Fill #0

## 2017-11-29 MED FILL — OLMSRTN-AMLDPN-HCTZ 40-5-12: 40-5-12.5 | 30 days supply | Qty: 30 | Fill #2

## 2017-11-29 MED FILL — OMEPRAZOLE 20 MG CPDR: 20 | 30 days supply | Qty: 30 | Fill #2

## 2017-12-14 ENCOUNTER — Telehealth: Payer: Self-pay

## 2017-12-14 ENCOUNTER — Telehealth: Payer: Self-pay | Admitting: Family Medicine

## 2017-12-14 NOTE — Telephone Encounter (Signed)
Called patient she will come by tomorrow to have TSH checked.

## 2017-12-14 NOTE — Telephone Encounter (Signed)
Copied from CRM [id number]. Topic: General - Other >> Dec 14, 2017  1:07 PM Gerrianne Scale wrote: Reason for CRM: pt states that Dr Dossie Arbour wanted to increase the levothyroxine (SYNTHROID, LEVOTHROID) 88 MCG tablet she is running out of medicine please give her a call if any questions    [facility]   From phone call 09/13/2017 Phone call discussed with patient elevated TSH patient with no thyroid type symptoms we will repeat TSH 2 months.

## 2017-12-14 NOTE — Telephone Encounter (Deleted)
-----   Message from Steele Sizer, MD sent at 12/14/2017  2:09 PM EDT ----- Regarding: labs Copied from CRM [id number]. Topic: General - Other >> Dec 14, 2017  1:07 PM Gerrianne Scale wrote: Reason for CRM: pt states that Dr Dossie Arbour wanted to increase the levothyroxine (SYNTHROID, LEVOTHROID) 88 MCG tablet she is running out of medicine please give her a call if any questions    [facility]   From phone call 09/13/2017 Phone call discussed with patient elevated TSH patient with no thyroid type symptoms we will repeat TSH 2 months.

## 2017-12-14 NOTE — Telephone Encounter (Signed)
Opened in error. Please see prior message.

## 2017-12-15 ENCOUNTER — Other Ambulatory Visit: Payer: 59

## 2017-12-15 DIAGNOSIS — R7989 Other specified abnormal findings of blood chemistry: Secondary | ICD-10-CM | POA: Diagnosis not present

## 2017-12-16 LAB — TSH: TSH: 2.1 u[IU]/mL (ref 0.450–4.500)

## 2017-12-18 ENCOUNTER — Encounter: Payer: Self-pay | Admitting: Family Medicine

## 2017-12-25 MED FILL — LEVOTHYROXINE 88 MCG TABLET: 88 | 30 days supply | Qty: 30 | Fill #1

## 2018-01-01 ENCOUNTER — Other Ambulatory Visit: Payer: Self-pay | Admitting: Family Medicine

## 2018-01-01 DIAGNOSIS — Z1231 Encounter for screening mammogram for malignant neoplasm of breast: Secondary | ICD-10-CM

## 2018-01-03 MED FILL — CALCITRIOL 0.5 MCG CAPS: 0.5 | 30 days supply | Qty: 60 | Fill #1

## 2018-01-03 MED FILL — OLMSRTN-AMLDPN-HCTZ 40-5-12: 40-5-12.5 | 30 days supply | Qty: 30 | Fill #3

## 2018-01-03 MED FILL — OMEPRAZOLE 20 MG CPDR: 20 | 30 days supply | Qty: 30 | Fill #3

## 2018-01-04 DIAGNOSIS — R635 Abnormal weight gain: Secondary | ICD-10-CM | POA: Diagnosis not present

## 2018-01-04 DIAGNOSIS — K219 Gastro-esophageal reflux disease without esophagitis: Secondary | ICD-10-CM | POA: Diagnosis not present

## 2018-01-04 DIAGNOSIS — Z1211 Encounter for screening for malignant neoplasm of colon: Secondary | ICD-10-CM | POA: Diagnosis not present

## 2018-01-31 MED FILL — OLMSRTN-AMLDPN-HCTZ 40-5-12: 40-5-12.5 | 30 days supply | Qty: 30 | Fill #4

## 2018-01-31 MED FILL — CALCITRIOL 0.5 MCG CAPS: 0.5 | 30 days supply | Qty: 60 | Fill #2

## 2018-01-31 MED FILL — LEVOTHYROXINE 88 MCG TABLET: 88 | 30 days supply | Qty: 30 | Fill #2

## 2018-01-31 MED FILL — OMEPRAZOLE 20 MG CPDR: 20 | 30 days supply | Qty: 30 | Fill #4

## 2018-02-09 ENCOUNTER — Ambulatory Visit: Payer: 59

## 2018-02-12 ENCOUNTER — Ambulatory Visit
Admission: RE | Admit: 2018-02-12 | Discharge: 2018-02-12 | Disposition: A | Discharge status: Home / Self Care | Payer: 59 | Source: Ambulatory Visit | Attending: Family Medicine | Admitting: Family Medicine

## 2018-02-12 DIAGNOSIS — Z1231 Encounter for screening mammogram for malignant neoplasm of breast: Secondary | ICD-10-CM | POA: Diagnosis not present

## 2018-02-12 MED FILL — GAVILYTE-G SOLUTION: 236 | 1 days supply | Qty: 4000 | Fill #0

## 2018-02-16 DIAGNOSIS — K5730 Diverticulosis of large intestine without perforation or abscess without bleeding: Secondary | ICD-10-CM | POA: Diagnosis not present

## 2018-02-16 DIAGNOSIS — Z1211 Encounter for screening for malignant neoplasm of colon: Secondary | ICD-10-CM | POA: Diagnosis not present

## 2018-02-16 LAB — HM COLONOSCOPY

## 2018-03-14 MED FILL — LEVOTHYROXINE 88 MCG TABLET: 88 | 30 days supply | Qty: 30 | Fill #3

## 2018-03-14 MED FILL — OLMSRTN-AMLDPN-HCTZ 40-5-12: 40-5-12.5 | 30 days supply | Qty: 30 | Fill #5

## 2018-03-14 MED FILL — CALCITRIOL 0.5 MCG CAPS: 0.5 | 30 days supply | Qty: 60 | Fill #3

## 2018-03-14 MED FILL — OMEPRAZOLE 20 MG CPDR: 20 | 30 days supply | Qty: 30 | Fill #5

## 2018-03-22 ENCOUNTER — Encounter: Payer: Self-pay | Admitting: Family Medicine

## 2018-03-22 ENCOUNTER — Ambulatory Visit (INDEPENDENT_AMBULATORY_CARE_PROVIDER_SITE_OTHER): Payer: 59 | Admitting: Family Medicine

## 2018-03-22 VITALS — BP 121/83 | HR 95 | Ht 64.0 in | Wt 279.0 lb

## 2018-03-22 DIAGNOSIS — I10 Essential (primary) hypertension: Secondary | ICD-10-CM | POA: Diagnosis not present

## 2018-03-22 DIAGNOSIS — E039 Hypothyroidism, unspecified: Secondary | ICD-10-CM

## 2018-03-22 DIAGNOSIS — R1310 Dysphagia, unspecified: Secondary | ICD-10-CM | POA: Diagnosis not present

## 2018-03-22 MED ORDER — OLMESARTAN-AMLODIPINE-HCTZ 40-5-12.5 MG PO TABS: ORAL_TABLET | ORAL | 6 refills | Status: DC

## 2018-03-22 NOTE — Assessment & Plan Note (Signed)
The current medical regimen is effective;  continue present plan and medications.  

## 2018-03-22 NOTE — Assessment & Plan Note (Addendum)
Discussed weight loss diet exercise Keep up wt loss

## 2018-03-22 NOTE — Progress Notes (Signed)
   BP 121/83   Pulse 95   Ht 5\' 4"  (1.626 m)   Wt 279 lb (126.6 kg)   SpO2 96%   BMI 47.89 kg/m    Subjective:    Patient ID: Kerry Phillips, female    DOB: 1955-12-20, 63 y.o.   MRN: [id number]  HPI: Kerry Phillips is a 63 y.o. female  Chief Complaint  Patient presents with  . Follow-up  . Hypertension  Blood pressure doing well with medications taken faithfully without problems good blood pressure control. Thyroid also doing well without problems or issues. Reflux stable with omeprazole.   Relevant past medical, surgical, family and social history reviewed and updated as indicated. Interim medical history since our last visit reviewed. Allergies and medications reviewed and updated.  Review of Systems  Constitutional: Negative.   Respiratory: Negative.   Cardiovascular: Negative.     Per HPI unless specifically indicated above     Objective:    BP 121/83   Pulse 95   Ht 5\' 4"  (1.626 m)   Wt 279 lb (126.6 kg)   SpO2 96%   BMI 47.89 kg/m   Wt Readings from Last 3 Encounters:  03/22/18 279 lb (126.6 kg)  09/25/17 285 lb (129.3 kg)  09/07/17 285 lb 8 oz (129.5 kg)    Physical Exam Constitutional:      Appearance: She is well-developed.  HENT:     Head: Normocephalic and atraumatic.  Eyes:     Conjunctiva/sclera: Conjunctivae normal.  Neck:     Musculoskeletal: Normal range of motion.  Cardiovascular:     Rate and Rhythm: Normal rate and regular rhythm.     Heart sounds: Normal heart sounds.  Pulmonary:     Effort: Pulmonary effort is normal.     Breath sounds: Normal breath sounds.  Musculoskeletal: Normal range of motion.  Skin:    Findings: No erythema.  Neurological:     Mental Status: She is alert and oriented to person, place, and time.  Psychiatric:        Behavior: Behavior normal.        Thought Content: Thought content normal.        Judgment: Judgment normal.         Assessment & Plan:   Problem List Items Addressed This Visit      Cardiovascular and Mediastinum   Essential hypertension - Primary    The current medical regimen is effective;  continue present plan and medications.       Relevant Medications   Olmesartan-amLODIPine-HCTZ 40-5-12.5 MG TABS   Other Relevant Orders   Basic metabolic panel     Digestive   Dysphagia    The current medical regimen is effective;  continue present plan and medications.         Endocrine   Hypothyroidism    The current medical regimen is effective;  continue present plan and medications.       Relevant Orders   Basic metabolic panel     Other   Morbid obesity (HCC)    Discussed weight loss diet exercise Keep up wt loss          Follow up plan: Return for Physical Exam.

## 2018-03-23 LAB — BASIC METABOLIC PANEL
BUN / CREAT RATIO: 14 (ref 12–28)
BUN: 14 mg/dL (ref 8–27)
CO2: 21 mmol/L (ref 20–29)
Calcium: 8.7 mg/dL (ref 8.7–10.3)
Chloride: 100 mmol/L (ref 96–106)
Creatinine, Ser: 0.99 mg/dL (ref 0.57–1.00)
GFR calc non Af Amer: 61 mL/min/{1.73_m2} (ref >59)
GFR, EST AFRICAN AMERICAN: 70 mL/min/{1.73_m2} (ref >59)
GLUCOSE: 88 mg/dL (ref 65–99)
Potassium: 4.2 mmol/L (ref 3.5–5.2)
SODIUM: 140 mmol/L (ref 134–144)

## 2018-03-26 ENCOUNTER — Encounter: Payer: Self-pay | Admitting: Family Medicine

## 2018-04-26 MED FILL — OMEPRAZOLE 20 MG CPDR: 20 | 30 days supply | Qty: 30 | Fill #6 | Status: TO

## 2018-04-26 MED FILL — CALCITRIOL 0.5 MCG CAPS: 0.5 | 30 days supply | Qty: 60 | Fill #4 | Status: TO

## 2018-04-26 MED FILL — LEVOTHYROXINE 88 MCG TABLET: 88 | 30 days supply | Qty: 30 | Fill #4 | Status: TO

## 2018-04-26 MED FILL — OLMSRTN-AMLDPN-HCTZ 40-5-12: 40-5-12.5 | 30 days supply | Qty: 30 | Fill #6

## 2018-05-25 MED FILL — OLMSRTN-AMLDPN-HCTZ 40-5-12: 40-5-12.5 | 30 days supply | Qty: 30 | Fill #0

## 2018-05-25 MED FILL — LEVOTHYROXINE 88 MCG TABLET: 88 | 30 days supply | Qty: 30 | Fill #0

## 2018-05-25 MED FILL — CALCITRIOL 0.5 MCG CAPS: 0.5 | 30 days supply | Qty: 60 | Fill #0

## 2018-05-25 MED FILL — OMEPRAZOLE 20 MG CPDR: 20 | 30 days supply | Qty: 30 | Fill #0

## 2018-07-10 MED FILL — OLMSRTN-AMLDPN-HCTZ 40-5-12: 40-5-12.5 | 30 days supply | Qty: 30 | Fill #1

## 2018-07-10 MED FILL — LEVOTHYROXINE 88 MCG TABLET: 88 | 30 days supply | Qty: 30 | Fill #1

## 2018-07-10 MED FILL — OMEPRAZOLE 20 MG CPDR: 20 | 30 days supply | Qty: 30 | Fill #1

## 2018-07-13 MED FILL — CALCITRIOL 0.5 MCG CAPS: 0.5 | 30 days supply | Qty: 60 | Fill #1

## 2018-08-09 MED FILL — CALCITRIOL 0.5 MCG CAPS: 0.5 | 30 days supply | Qty: 60 | Fill #2

## 2018-08-09 MED FILL — OLMSRTN-AMLDPN-HCTZ 40-5-12: 40-5-12.5 | 30 days supply | Qty: 30 | Fill #2

## 2018-08-09 MED FILL — LEVOTHYROXINE 88 MCG TABLET: 88 | 30 days supply | Qty: 30 | Fill #2

## 2018-08-13 ENCOUNTER — Encounter: Payer: 59 | Admitting: Family Medicine

## 2018-09-18 ENCOUNTER — Other Ambulatory Visit: Payer: Self-pay | Admitting: Family Medicine

## 2018-09-18 MED FILL — OLMSRTN-AMLDPN-HCTZ 40-5-12: 40-5-12.5 | 30 days supply | Qty: 30 | Fill #3

## 2018-09-18 MED FILL — LEVOTHYROXINE 88 MCG TABLET: 88 | 30 days supply | Qty: 30 | Fill #0

## 2018-09-18 MED FILL — CALCITRIOL 0.5 MCG CAPS: 0.5 | 30 days supply | Qty: 60 | Fill #0

## 2018-09-18 NOTE — Telephone Encounter (Signed)
Requested Prescriptions  Pending Prescriptions Disp Refills  . levothyroxine (SYNTHROID) 88 MCG tablet [Pharmacy Med Name: LEVOTHYROXINE 88 MCG TABLET 88 TAB] 30 tablet 0    Sig: TAKE 1 TABLET (88 MCG TOTAL) BY MOUTH DAILY BEFORE BREAKFAST.     Endocrinology:  Hypothyroid Agents Failed - 09/18/2018  1:16 PM      Failed - TSH needs to be rechecked within 3 months after an abnormal result. Refill until TSH is due.      Passed - TSH in normal range and within 360 days    TSH  Date Value Ref Range Status  12/15/2017 2.100 0.450 - 4.500 uIU/mL Final         Passed - Valid encounter within last 12 months    Recent Outpatient Visits          6 months ago Essential hypertension   [facility] Crissman, Jeannette How, MD   1 year ago Essential hypertension   [place], Jeannette How, MD   1 year ago Essential hypertension   [place], Jeannette How, MD   2 years ago Annual physical exam   [facility] Crissman, Jeannette How, MD   3 years ago Hypothyroidism, unspecified hypothyroidism type   [place], [place]      Future Appointments            In 1 week Crissman, Jeannette How, MD [place]           . calcitRIOL (ROCALTROL) 0.5 MCG capsule [Pharmacy Med Name: CALCITRIOL 0.5 MCG CAPS 0.5 CAP] 60 capsule 0    Sig: TAKE [place] A DAY     Endocrinology:  Vitamins - Vitamin D Supplementation Failed - 09/18/2018  1:16 PM      Failed - 50,000 IU strengths are not delegated      Failed - Phosphate in normal range and within 360 days    No results found for: PHOS       Failed - Vitamin D in normal range and within 360 days    No results found for: [id number], [id number], [id number], [place], [place], [place], [place], 25OHVITD1, 25OHVITD2, 25OHVITD3, VD25OH       Passed - Ca in normal range and within 360 days    Calcium  Date Value Ref Range Status  03/22/2018 8.7 8.7 -  10.3 mg/dL Final         Passed - Valid encounter within last 12 months    Recent Outpatient Visits          6 months ago Essential hypertension   [place] Crissman, Jeannette How, MD   1 year ago Essential hypertension   [place] Crissman, Jeannette How, MD   1 year ago Essential hypertension   [place], Jeannette How, MD   2 years ago Annual physical exam   [place] Crissman, Jeannette How, MD   3 years ago Hypothyroidism, unspecified hypothyroidism type   [place], [place]      Future Appointments            In 1 week Crissman, Jeannette How, MD [facility], [facility]           Patient scheduled for follow up 09/25/2018.

## 2018-09-25 ENCOUNTER — Ambulatory Visit (INDEPENDENT_AMBULATORY_CARE_PROVIDER_SITE_OTHER): Payer: 59 | Admitting: Family Medicine

## 2018-09-25 ENCOUNTER — Encounter: Payer: Self-pay | Admitting: Family Medicine

## 2018-09-25 ENCOUNTER — Other Ambulatory Visit: Payer: Self-pay

## 2018-09-25 VITALS — BP 124/80 | Wt 280.0 lb

## 2018-09-25 DIAGNOSIS — I10 Essential (primary) hypertension: Secondary | ICD-10-CM

## 2018-09-25 DIAGNOSIS — E039 Hypothyroidism, unspecified: Secondary | ICD-10-CM | POA: Diagnosis not present

## 2018-09-25 DIAGNOSIS — Z0000 Encounter for general adult medical examination without abnormal findings: Secondary | ICD-10-CM | POA: Diagnosis not present

## 2018-09-25 LAB — MICROSCOPIC EXAMINATION: WBC, UA: NONE SEEN /hpf (ref 0–5)

## 2018-09-25 LAB — URINALYSIS, ROUTINE W REFLEX MICROSCOPIC
Bilirubin, UA: NEGATIVE
Glucose, UA: NEGATIVE
Ketones, UA: NEGATIVE
Leukocytes,UA: NEGATIVE
Nitrite, UA: NEGATIVE
Protein,UA: NEGATIVE
Specific Gravity, UA: 1.02 (ref 1.005–1.030)
Urobilinogen, Ur: 0.2 mg/dL (ref 0.2–1.0)
pH, UA: 6 (ref 5.0–7.5)

## 2018-09-25 MED ORDER — OMEPRAZOLE 20 MG PO CPDR: 20.0000 mg | DELAYED_RELEASE_CAPSULE | Freq: Every day | ORAL | 4 refills | Status: DC

## 2018-09-25 MED ORDER — OLMESARTAN-AMLODIPINE-HCTZ 40-5-12.5 MG PO TABS: ORAL_TABLET | ORAL | 4 refills | Status: DC

## 2018-09-25 MED ORDER — CALCITRIOL 0.5 MCG PO CAPS: 0.5000 ug | ORAL_CAPSULE | Freq: Two times a day (BID) | ORAL | 4 refills | Status: DC

## 2018-09-25 MED ORDER — LEVOTHYROXINE SODIUM 88 MCG PO TABS: 88.0000 ug | ORAL_TABLET | Freq: Every day | ORAL | 4 refills | Status: DC

## 2018-09-25 NOTE — Assessment & Plan Note (Signed)
Discussed diet nutrition exercise

## 2018-09-25 NOTE — Progress Notes (Signed)
BP 124/80   Wt 280 lb (127 kg)   BMI 48.06 kg/m    Subjective:    Patient ID: Kerry Phillips, female    DOB: 15-Jan-1956, 63 y.o.   MRN: [id number]  HPI: Kerry Phillips is a 63 y.o. female  Med check Patient all in all doing well no complaints taking medications faithfully without problems no calcium issues. Blood pressure good control. Having trouble with weight loss and weight is been stable though. Has an appointment for an office physical coming up. Thyroid also stable along with reflux  Relevant past medical, surgical, family and social history reviewed and updated as indicated. Interim medical history since our last visit reviewed. Allergies and medications reviewed and updated.  Review of Systems  Constitutional: Negative.   HENT: Negative.   Eyes: Negative.   Respiratory: Negative.   Cardiovascular: Negative.   Gastrointestinal: Negative.   Endocrine: Negative.   Genitourinary: Negative.   Musculoskeletal: Negative.   Skin: Negative.   Allergic/Immunologic: Negative.   Neurological: Negative.   Hematological: Negative.   Psychiatric/Behavioral: Negative.     Per HPI unless specifically indicated above     Objective:    BP 124/80   Wt 280 lb (127 kg)   BMI 48.06 kg/m   Wt Readings from Last 3 Encounters:  09/25/18 280 lb (127 kg)  03/22/18 279 lb (126.6 kg)  09/25/17 285 lb (129.3 kg)    Physical Exam  Results for orders placed or performed in visit on 03/22/18  Basic metabolic panel  Result Value Ref Range   Glucose 88 65 - 99 mg/dL   BUN 14 8 - 27 mg/dL   Creatinine, Ser 4.090.99 0.57 - 1.00 mg/dL   GFR calc non Af Amer 61 >59 mL/min/1.73   GFR calc Af Amer 70 >59 mL/min/1.73   BUN/Creatinine Ratio 14 12 - 28   Sodium 140 134 - 144 mmol/L   Potassium 4.2 3.5 - 5.2 mmol/L   Chloride 100 96 - 106 mmol/L   CO2 21 20 - 29 mmol/L   Calcium 8.7 8.7 - 10.3 mg/dL      Assessment & Plan:   Problem List Items Addressed This Visit      Cardiovascular and Mediastinum   Essential hypertension    The current medical regimen is effective;  continue present plan and medications.       Relevant Medications   Olmesartan-amLODIPine-HCTZ 40-5-12.5 MG TABS     Endocrine   Hypothyroidism    The current medical regimen is effective;  continue present plan and medications.       Relevant Medications   levothyroxine (SYNTHROID) 88 MCG tablet     Other   Hypocalcemia    The current medical regimen is effective;  continue present plan and medications.       Relevant Medications   calcitRIOL (ROCALTROL) 0.5 MCG capsule   Morbid obesity (HCC)    Discussed diet nutrition exercise       Other Visit Diagnoses    PE (physical exam), annual    -  Primary   Relevant Orders   Comprehensive metabolic panel   Lipid panel   CBC with Differential/Platelet   TSH   Urinalysis, Routine w reflex microscopic      Telemedicine using audio/video telecommunications for a synchronous communication visit. Today's visit due to COVID-19 isolation precautions I connected with and verified that I am speaking with the correct person using two identifiers.   I discussed the limitations, risks, security  and privacy concerns of performing an evaluation and management service by telecommunication and the availability of in person appointments. I also discussed with the patient that there may be a patient responsible charge related to this service. The patient expressed understanding and agreed to proceed. The patient's location is office. I am at home.   I discussed the assessment and treatment plan with the patient. The patient was provided an opportunity to ask questions and all were answered. The patient agreed with the plan and demonstrated an understanding of the instructions.   The patient was advised to call back or seek an in-person evaluation if the symptoms worsen or if the condition fails to improve as anticipated.   I provided 21+  minutes of time during this encounter.  Follow up plan: Return in about 6 months (around 03/28/2019) for BMP, calcium.

## 2018-09-25 NOTE — Assessment & Plan Note (Signed)
The current medical regimen is effective;  continue present plan and medications.  

## 2018-09-25 NOTE — Addendum Note (Signed)
Addended by: Delton Prairie on: 09/25/2018 09:40 AM   Modules accepted: Orders

## 2018-09-26 ENCOUNTER — Other Ambulatory Visit: Payer: Self-pay | Admitting: Family Medicine

## 2018-09-26 DIAGNOSIS — R7989 Other specified abnormal findings of blood chemistry: Secondary | ICD-10-CM

## 2018-09-26 LAB — CBC WITH DIFFERENTIAL/PLATELET
Basophils Absolute: 0 10*3/uL (ref 0.0–0.2)
Basos: 1 %
EOS (ABSOLUTE): 0.1 10*3/uL (ref 0.0–0.4)
Eos: 2 %
Hematocrit: 35.7 % (ref 34.0–46.6)
Hemoglobin: 12 g/dL (ref 11.1–15.9)
Immature Grans (Abs): 0 10*3/uL (ref 0.0–0.1)
Immature Granulocytes: 0 %
Lymphocytes Absolute: 1.5 10*3/uL (ref 0.7–3.1)
Lymphs: 24 %
MCH: 27.8 pg (ref 26.6–33.0)
MCHC: 33.6 g/dL (ref 31.5–35.7)
MCV: 83 fL (ref 79–97)
Monocytes Absolute: 0.4 10*3/uL (ref 0.1–0.9)
Monocytes: 7 %
Neutrophils Absolute: 4.1 10*3/uL (ref 1.4–7.0)
Neutrophils: 66 %
Platelets: 322 10*3/uL (ref 150–450)
RBC: 4.31 x10E6/uL (ref 3.77–5.28)
RDW: 14.5 % (ref 11.7–15.4)
WBC: 6.2 10*3/uL (ref 3.4–10.8)

## 2018-09-26 LAB — LIPID PANEL
Chol/HDL Ratio: 4.5 ratio — ABNORMAL HIGH (ref 0.0–4.4)
Cholesterol, Total: 218 mg/dL — ABNORMAL HIGH (ref 100–199)
HDL: 48 mg/dL (ref >39)
LDL Calculated: 147 mg/dL — ABNORMAL HIGH (ref 0–99)
Triglycerides: 117 mg/dL (ref 0–149)
VLDL Cholesterol Cal: 23 mg/dL (ref 5–40)

## 2018-09-26 LAB — COMPREHENSIVE METABOLIC PANEL
ALT: 7 IU/L (ref 0–32)
AST: 14 IU/L (ref 0–40)
Albumin/Globulin Ratio: 1.7 (ref 1.2–2.2)
Albumin: 4.4 g/dL (ref 3.8–4.8)
Alkaline Phosphatase: 53 IU/L (ref 39–117)
BUN/Creatinine Ratio: 17 (ref 12–28)
BUN: 15 mg/dL (ref 8–27)
Bilirubin Total: 0.4 mg/dL (ref 0.0–1.2)
CO2: 25 mmol/L (ref 20–29)
Calcium: 8.6 mg/dL — ABNORMAL LOW (ref 8.7–10.3)
Chloride: 98 mmol/L (ref 96–106)
Creatinine, Ser: 0.89 mg/dL (ref 0.57–1.00)
GFR calc Af Amer: 80 mL/min/{1.73_m2} (ref >59)
GFR calc non Af Amer: 69 mL/min/{1.73_m2} (ref >59)
Globulin, Total: 2.6 g/dL (ref 1.5–4.5)
Glucose: 82 mg/dL (ref 65–99)
Potassium: 4.3 mmol/L (ref 3.5–5.2)
Sodium: 140 mmol/L (ref 134–144)
Total Protein: 7 g/dL (ref 6.0–8.5)

## 2018-09-26 LAB — TSH: TSH: 13.04 u[IU]/mL — ABNORMAL HIGH (ref 0.450–4.500)

## 2018-09-26 NOTE — Progress Notes (Signed)
Discussed with patient by staff elevated TSH needs to repeat in a week or 2

## 2018-10-05 ENCOUNTER — Other Ambulatory Visit: Payer: 59

## 2018-10-05 ENCOUNTER — Other Ambulatory Visit: Payer: Self-pay

## 2018-10-05 DIAGNOSIS — R7989 Other specified abnormal findings of blood chemistry: Secondary | ICD-10-CM | POA: Diagnosis not present

## 2018-10-06 LAB — TSH: TSH: 8.87 u[IU]/mL — ABNORMAL HIGH (ref 0.450–4.500)

## 2018-10-26 MED FILL — CALCITRIOL 0.5 MCG CAPS: 0.5 | 90 days supply | Qty: 180 | Fill #0

## 2018-10-26 MED FILL — LEVOTHYROXINE 88 MCG TABLET: 88 | 90 days supply | Qty: 90 | Fill #0

## 2018-10-26 MED FILL — OLMSRTN-AMLDPN-HCTZ 40-5-12: 40-5-12.5 | 30 days supply | Qty: 30 | Fill #4

## 2018-11-19 ENCOUNTER — Ambulatory Visit (INDEPENDENT_AMBULATORY_CARE_PROVIDER_SITE_OTHER): Payer: 59 | Admitting: Orthopedic Surgery

## 2018-11-19 ENCOUNTER — Encounter: Payer: Self-pay | Admitting: Orthopedic Surgery

## 2018-11-19 VITALS — Ht 64.0 in | Wt 280.0 lb

## 2018-11-19 DIAGNOSIS — M9262 Juvenile osteochondrosis of tarsus, left ankle: Secondary | ICD-10-CM | POA: Diagnosis not present

## 2018-11-19 DIAGNOSIS — M9261 Juvenile osteochondrosis of tarsus, right ankle: Secondary | ICD-10-CM | POA: Diagnosis not present

## 2018-11-19 NOTE — Progress Notes (Signed)
Office Visit Note   Patient: Kerry Phillips           Date of Birth: [birth year]           MRN: [id number] Visit Date: 11/19/2018              Requested by: Guadalupe Maple, MD [address] [place],   [address] PCP: Guadalupe Maple, MD  Chief Complaint  Patient presents with  . Left Foot - Pain  . Right Foot - Pain      HPI: Patient is a 63 year old woman who presents complaining of Haglund's deformity bilaterally right worse than left she states she is tried inserts Achilles stretching without relief.  Patient states she has pain with activities of daily living.  She has even tried CBD lotion.  Assessment & Plan: Visit Diagnoses:  1. Haglund's deformity of both heels     Plan: Discussed continued nonoperative treatment versus surgical intervention for endoscopic Haglund resection.  Risk and benefits were discussed including infection neurovascular injury nonhealing of the skin rupture of the Achilles need for additional surgery.  Patient states she understands wished to proceed at this time.  Follow-Up Instructions: Return in about 1 week (around 11/26/2018).   Ortho Exam  Patient is alert, oriented, no adenopathy, well-dressed, normal affect, normal respiratory effort. Examination patient has a Haglund's deformity worse on the right than the left.  She has dorsiflexion about 10 degrees bilaterally.  Her abdomen is deformity is tender to palpation on the right the Achilles tendon is not tender to palpation proximally.  There is no redness no cellulitis no signs of infection.  Imaging: No results found. No images are attached to the encounter.  Labs: No results found for: HGBA1C, ESRSEDRATE, CRP, LABURIC, REPTSTATUS, GRAMSTAIN, CULT, LABORGA   Lab Results  Component Value Date   ALBUMIN 4.4 09/25/2018   ALBUMIN 4.2 09/07/2017   ALBUMIN 4.6 04/05/2016    No results found for: MG No results found for: VD25OH  No results found for: PREALBUMIN CBC EXTENDED  Latest Ref Rng & Units 09/25/2018 09/07/2017 04/05/2016  WBC 3.4 - 10.8 x10E3/uL 6.2 7.2 6.7  RBC 3.77 - 5.28 x10E6/uL 4.31 4.10 4.35  HGB 11.1 - 15.9 g/dL 12.0 11.4 12.1  HCT 34.0 - 46.6 % 35.7 34.7 35.5  PLT 150 - 450 x10E3/uL 322 315 331  NEUTROABS 1.4 - 7.0 x10E3/uL 4.1 4.4 4.4  LYMPHSABS 0.7 - 3.1 x10E3/uL 1.5 2.2 1.7     Body mass index is 48.06 kg/m.  Orders:  No orders of the defined types were placed in this encounter.  No orders of the defined types were placed in this encounter.    Procedures: No procedures performed  Clinical Data: No additional findings.  ROS:  All other systems negative, except as noted in the HPI. Review of Systems  Objective: Vital Signs: Ht 5\' 4"  (1.626 m)   Wt 280 lb (127 kg)   BMI 48.06 kg/m   Specialty Comments:  No specialty comments available.  PMFS History: Patient Active Problem List   Diagnosis Date Noted  . Morbid obesity ([place]) 03/22/2018  . Hypocalcemia 09/07/2017  . Dysphagia 01/17/2017  . BMI 50.0-59.9, adult ([place]) 04/05/2016  . Arthritis of both knees 04/05/2016  . Essential hypertension 12/22/2014  . Hypothyroidism 12/22/2014   History reviewed. No pertinent past medical history.  Family History  Problem Relation Age of Onset  . Hypertension Mother   . Cancer Father  Lung  . Stroke Sister   . Hypertension Sister   . Hypertension Daughter   . Hypertension Son   . Lupus Sister   . Breast cancer Sister     Past Surgical History:  Procedure Laterality Date  . ABDOMINAL HYSTERECTOMY    . COLONOSCOPY    . THYROIDECTOMY     90% removed   Social History   Occupational History  . Not on file  Tobacco Use  . Smoking status: Never Smoker  . Smokeless tobacco: Never Used  Substance and Sexual Activity  . Alcohol use: No  . Drug use: No  . Sexual activity: Yes

## 2018-11-20 ENCOUNTER — Encounter: Payer: 59 | Admitting: Nurse Practitioner

## 2018-11-23 ENCOUNTER — Encounter: Payer: Self-pay | Admitting: Nurse Practitioner

## 2018-11-23 ENCOUNTER — Ambulatory Visit (INDEPENDENT_AMBULATORY_CARE_PROVIDER_SITE_OTHER): Payer: 59 | Admitting: Nurse Practitioner

## 2018-11-23 ENCOUNTER — Other Ambulatory Visit: Payer: Self-pay

## 2018-11-23 VITALS — BP 128/80 | HR 88 | Temp 98.1°F | Ht 61.5 in | Wt 289.0 lb

## 2018-11-23 DIAGNOSIS — I10 Essential (primary) hypertension: Secondary | ICD-10-CM | POA: Diagnosis not present

## 2018-11-23 DIAGNOSIS — Z0000 Encounter for general adult medical examination without abnormal findings: Secondary | ICD-10-CM | POA: Diagnosis not present

## 2018-11-23 DIAGNOSIS — E039 Hypothyroidism, unspecified: Secondary | ICD-10-CM

## 2018-11-23 DIAGNOSIS — Z6841 Body Mass Index (BMI) 40.0 and over, adult: Secondary | ICD-10-CM

## 2018-11-23 NOTE — Patient Instructions (Signed)
Mammogram A mammogram is an X-ray of the breasts that is done to check for changes that are not normal. This test can screen for and find any changes that may suggest breast cancer. Mammograms are regularly done on women. A man may have a mammogram if he has a lump or swelling in his breast. This test can also help to find other changes and variations in the breast. Tell a doctor:  About any allergies you have.  If you have breast implants.  If you have had previous breast disease, biopsy, or surgery.  If you are breastfeeding.  If you are younger than age 25.  If you have a family history of breast cancer.  Whether you are pregnant or may be pregnant. What are the risks? Generally, this is a safe procedure. However, problems may occur, including:  Exposure to radiation. Radiation levels are very low with this test.  The results being misinterpreted.  The need for further tests.  The inability of the mammogram to detect certain cancers. What happens before the procedure?  Have this test done about 1-2 weeks after your period. This is usually when your breasts are the least tender.  If you are visiting a new doctor or clinic, send any past mammogram images to your new doctor's office.  Wash your breasts and under your arms the day of the test.  Do not use deodorants, perfumes, lotions, or powders on the day of the test.  Take off any jewelry from your neck.  Wear clothes that you can change into and out of easily. What happens during the procedure?   You will undress from the waist up. You will put on a gown.  You will stand in front of the X-ray machine.  Each breast will be placed between two plastic or glass plates. The plates will press down on your breast for a few seconds. Try to stay as relaxed as possible. This does not cause any harm to your breasts. Any discomfort you feel will be very brief.  X-rays will be taken from different angles of each breast. The  procedure may vary among doctors and hospitals. What happens after the procedure?  The mammogram will be read by a specialist (radiologist).  You may need to do certain parts of the test again. This depends on the quality of the images.  Ask when your test results will be ready. Make sure you get your test results.  You may go back to your normal activities. Summary  A mammogram is a low energy X-ray of the breasts that is done to check for abnormal changes. A man may have this test if he has a lump or swelling in his breast.  Before the procedure, tell your doctor about any breast problems that you have had in the past.  Have this test done about 1-2 weeks after your period.  For the test, each breast will be placed between two plastic or glass plates. The plates will press down on your breast for a few seconds.  The mammogram will be read by a specialist (radiologist). Ask when your test results will be ready. Make sure you get your test results. This information is not intended to replace advice given to you by your health care provider. Make sure you discuss any questions you have with your health care provider. Document Released: 05/13/2008 Document Revised: 10/05/2017 Document Reviewed: 10/05/2017 Elsevier Patient Education  2020 [employer]  

## 2018-11-23 NOTE — Assessment & Plan Note (Addendum)
Chronic, stable with BP below goal in office on repeat and at home.  Continue current medication regimen and adjust as needed.  Annual labs were obtained in July.

## 2018-11-23 NOTE — Progress Notes (Signed)
BP 128/80 (BP Location: Right Arm)   Pulse 88   Temp 98.1 F (36.7 C) (Oral)   Ht 5' 1.5" (1.562 m)   Wt 289 lb (131.1 kg)   SpO2 95%   BMI 53.72 kg/m    Subjective:    Patient ID: Kerry Phillips, female    DOB: 04-20-1955, 63 y.o.   MRN: [id number]  HPI: Kerry Phillips is a 63 y.o. female presenting on 11/23/2018 for comprehensive medical examination. Current medical complaints include:none  She currently lives with: significant other Menopausal Symptoms: yes , no interventions needed  HYPERTENSION Hypertension status: stable  Satisfied with current treatment? yes Duration of hypertension: chronic BP monitoring frequency:  daily BP range: 120-130/70-80 BP medication side effects:  no Medication compliance: good compliance Aspirin: no Recurrent headaches: no Visual changes: no Palpitations: no Dyspnea: no Chest pain: no Lower extremity edema: no Dizzy/lightheaded: no   HYPOTHYROIDISM History of thyroid surgery.  On Levothyroxine 88 MCG daily.  Recent TSH trending downwards was 13.040 on 10/05/18 and recent 8.870. Thyroid control status:stable Satisfied with current treatment? yes Medication side effects: no Medication compliance: good compliance Etiology of hypothyroidism: history of surgery Recent dose adjustment:no Fatigue: no Cold intolerance: no Heat intolerance: no Weight gain: no Weight loss: no Constipation: no Diarrhea/loose stools: no Palpitations: no Lower extremity edema: no Anxiety/depressed mood: no  Depression Screen done today and results listed below:  Depression screen [facility] 2/9 11/23/2018 10/03/2017 01/17/2017  Decreased Interest 0 0 0  Down, Depressed, Hopeless 0 0 0  PHQ - 2 Score 0 0 0  Altered sleeping 0 - -  Tired, decreased energy 0 - -  Change in appetite 0 - -  Feeling bad or failure about yourself  0 - -  Trouble concentrating 0 - -  Moving slowly or fidgety/restless 0 - -  Suicidal thoughts 0 - -  PHQ-9 Score 0 - -  Difficult  doing work/chores Not difficult at all - -    The patient does not have a history of falls. I did not complete a risk assessment for falls. A plan of care for falls was not documented.   Past Medical History:  History reviewed. No pertinent past medical history.  Surgical History:  Past Surgical History:  Procedure Laterality Date  . ABDOMINAL HYSTERECTOMY    . COLONOSCOPY    . THYROIDECTOMY     90% removed    Medications:  Current Outpatient Medications on File Prior to Visit  Medication Sig  . calcitRIOL (ROCALTROL) 0.5 MCG capsule Take 1 capsule (0.5 mcg total) by mouth 2 (two) times daily.  Marland Kitchen. levothyroxine (SYNTHROID) 88 MCG tablet Take 1 tablet (88 mcg total) by mouth daily before breakfast.  . Olmesartan-amLODIPine-HCTZ 40-5-12.5 MG TABS TAKE 1 TABLET BY MOUTH ONCE DAILY   No current facility-administered medications on file prior to visit.     Allergies:  No Known Allergies  Social History:  Social History   Socioeconomic History  . Marital status: Married    Spouse name: Not on file  . Number of children: Not on file  . Years of education: Not on file  . Highest education level: Not on file  Occupational History  . Not on file  Social Needs  . Financial resource strain: Not on file  . Food insecurity    Worry: Not on file    Inability: Not on file  . Transportation needs    Medical: Not on file    Non-medical: Not on  file  Tobacco Use  . Smoking status: Never Smoker  . Smokeless tobacco: Never Used  Substance and Sexual Activity  . Alcohol use: No  . Drug use: No  . Sexual activity: Yes  Lifestyle  . Physical activity    Days per week: Not on file    Minutes per session: Not on file  . Stress: Not on file  Relationships  . Social Herbalist on phone: Not on file    Gets together: Not on file    Attends religious service: Not on file    Active member of club or organization: Not on file    Attends meetings of clubs or organizations:  Not on file    Relationship status: Not on file  . Intimate partner violence    Fear of current or ex partner: Not on file    Emotionally abused: Not on file    Physically abused: Not on file    Forced sexual activity: Not on file  Other Topics Concern  . Not on file  Social History Narrative  . Not on file   Social History   Tobacco Use  Smoking Status Never Smoker  Smokeless Tobacco Never Used   Social History   Substance and Sexual Activity  Alcohol Use No    Family History:  Family History  Problem Relation Age of Onset  . Hypertension Mother   . Cancer Father        Lung  . Stroke Sister   . Hypertension Sister   . Hypertension Daughter   . Hypertension Son   . Lupus Sister   . Breast cancer Sister     Past medical history, surgical history, medications, allergies, family history and social history reviewed with patient today and changes made to appropriate areas of the chart.   Review of Systems - negative All other ROS negative except what is listed above and in the HPI.      Objective:    BP 128/80 (BP Location: Right Arm)   Pulse 88   Temp 98.1 F (36.7 C) (Oral)   Ht 5' 1.5" (1.562 m)   Wt 289 lb (131.1 kg)   SpO2 95%   BMI 53.72 kg/m   Wt Readings from Last 3 Encounters:  11/23/18 289 lb (131.1 kg)  11/19/18 280 lb (127 kg)  09/25/18 280 lb (127 kg)    Physical Exam Constitutional:      General: She is awake. She is not in acute distress.    Appearance: She is well-developed. She is not ill-appearing.  HENT:     Head: Normocephalic and atraumatic.     Right Ear: Hearing, tympanic membrane, ear canal and external ear normal. No drainage.     Left Ear: Hearing, tympanic membrane, ear canal and external ear normal. No drainage.     Nose: Nose normal.     Right Sinus: No maxillary sinus tenderness or frontal sinus tenderness.     Left Sinus: No maxillary sinus tenderness or frontal sinus tenderness.     Mouth/Throat:     Mouth: Mucous  membranes are moist.     Pharynx: Oropharynx is clear. Uvula midline. No pharyngeal swelling, oropharyngeal exudate or posterior oropharyngeal erythema.  Eyes:     General: Lids are normal.        Right eye: No discharge.        Left eye: No discharge.     Extraocular Movements: Extraocular movements intact.     Conjunctiva/sclera:  Conjunctivae normal.     Pupils: Pupils are equal, round, and reactive to light.     Visual Fields: Right eye visual fields normal and left eye visual fields normal.  Neck:     Musculoskeletal: Normal range of motion and neck supple.     Thyroid: No thyromegaly.     Vascular: No carotid bruit.     Trachea: Trachea normal.  Cardiovascular:     Rate and Rhythm: Normal rate and regular rhythm.     Heart sounds: Normal heart sounds. No murmur. No gallop.   Pulmonary:     Effort: Pulmonary effort is normal. No accessory muscle usage or respiratory distress.     Breath sounds: Normal breath sounds.  Chest:     Breasts:        Right: Normal. No swelling, bleeding, inverted nipple, mass, nipple discharge, skin change or tenderness.        Left: Normal. No swelling, bleeding, inverted nipple, mass, nipple discharge, skin change or tenderness.  Abdominal:     General: Bowel sounds are normal.     Palpations: Abdomen is soft. There is no hepatomegaly or splenomegaly.     Tenderness: There is no abdominal tenderness.  Musculoskeletal: Normal range of motion.     Right lower leg: No edema.     Left lower leg: No edema.  Lymphadenopathy:     Head:     Right side of head: No submental, submandibular, tonsillar, preauricular or posterior auricular adenopathy.     Left side of head: No submental, submandibular, tonsillar, preauricular or posterior auricular adenopathy.     Cervical: No cervical adenopathy.     Upper Body:     Right upper body: No supraclavicular, axillary or pectoral adenopathy.     Left upper body: No supraclavicular, axillary or pectoral  adenopathy.  Skin:    General: Skin is warm and dry.     Capillary Refill: Capillary refill takes less than 2 seconds.     Findings: No rash.  Neurological:     Mental Status: She is alert and oriented to person, place, and time.     Cranial Nerves: Cranial nerves are intact.     Gait: Gait is intact.     Deep Tendon Reflexes: Reflexes are normal and symmetric.     Reflex Scores:      Brachioradialis reflexes are 2+ on the right side and 2+ on the left side.      Patellar reflexes are 2+ on the right side and 2+ on the left side. Psychiatric:        Attention and Perception: Attention normal.        Mood and Affect: Mood normal.        Speech: Speech normal.        Behavior: Behavior normal. Behavior is cooperative.        Thought Content: Thought content normal.        Judgment: Judgment normal.     Results for orders placed or performed in visit on 10/05/18  TSH  Result Value Ref Range   TSH 8.870 (H) 0.450 - 4.500 uIU/mL      Assessment & Plan:   Problem List Items Addressed This Visit      Cardiovascular and Mediastinum   Essential hypertension    Chronic, stable with BP below goal in office on repeat and at home.  Continue current medication regimen and adjust as needed.  Annual labs were obtained in July.  Endocrine   Hypothyroidism    Chronic, ongoing.  Will recheck thyroid panel today due to recent increased levels.  Adjust dose Levo as needed.  Return in 6 months or sooner if dose adjustment.      Relevant Orders   Thyroid Panel With TSH     Other   BMI 50.0-59.9, adult (HCC)    Recommend continued focus on health diet choices and regular physical activity (30 minutes 5 days a week).      Morbid obesity (HCC)    Recommend continued focus on health diet choices and regular physical activity (30 minutes 5 days a week).  Focus on small changes at a time.       Other Visit Diagnoses    Annual physical exam    -  Primary   Annual labs were obtained  in July.        Follow up plan: Return in about 6 months (around 05/23/2019) for HTn, Hypothyroid.   LABORATORY TESTING:  - Pap smear: not applicable, had hysterectomy with cervix removed and ovaries remain  IMMUNIZATIONS:   - Tdap: Tetanus vaccination status reviewed: last tetanus booster within 10 years. - Influenza: Refused - Pneumovax: Not applicable - Prevnar: Not applicable - HPV: Not applicable - Zostavax vaccine: Refused  SCREENING: -Mammogram: Up to date  - Colonoscopy: Up to date  - Bone Density: Not applicable  -Hearing Test: Done elsewhere  -Spirometry: Not applicable   PATIENT COUNSELING:   Advised to take 1 mg of folate supplement per day if capable of pregnancy.   Sexuality: Discussed sexually transmitted diseases, partner selection, use of condoms, avoidance of unintended pregnancy  and contraceptive alternatives.   Advised to avoid cigarette smoking.  I discussed with the patient that most people either abstain from alcohol or drink within safe limits (<=14/week and <=4 drinks/occasion for males, <=7/weeks and <= 3 drinks/occasion for females) and that the risk for alcohol disorders and other health effects rises proportionally with the number of drinks per week and how often a drinker exceeds daily limits.  Discussed cessation/primary prevention of drug use and availability of treatment for abuse.   Diet: Encouraged to adjust caloric intake to maintain  or achieve ideal body weight, to reduce intake of dietary saturated fat and total fat, to limit sodium intake by avoiding high sodium foods and not adding table salt, and to maintain adequate dietary potassium and calcium preferably from fresh fruits, vegetables, and low-fat dairy products.    stressed the importance of regular exercise  Injury prevention: Discussed safety belts, safety helmets, smoke detector, smoking near bedding or upholstery.   Dental health: Discussed importance of regular tooth  brushing, flossing, and dental visits.    NEXT PREVENTATIVE PHYSICAL DUE IN 1 YEAR. Return in about 6 months (around 05/23/2019) for HTn, Hypothyroid.

## 2018-11-23 NOTE — Assessment & Plan Note (Signed)
Recommend continued focus on health diet choices and regular physical activity (30 minutes 5 days a week).  Focus on small changes at a time. 

## 2018-11-23 NOTE — Assessment & Plan Note (Signed)
Recommend continued focus on health diet choices and regular physical activity (30 minutes 5 days a week). 

## 2018-11-23 NOTE — Assessment & Plan Note (Signed)
Chronic, ongoing.  Will recheck thyroid panel today due to recent increased levels.  Adjust dose Levo as needed.  Return in 6 months or sooner if dose adjustment.

## 2018-11-24 LAB — THYROID PANEL WITH TSH
Free Thyroxine Index: 1.8 (ref 1.2–4.9)
T3 Uptake Ratio: 26 % (ref 24–39)
T4, Total: 7 ug/dL (ref 4.5–12.0)
TSH: 12.8 u[IU]/mL — ABNORMAL HIGH (ref 0.450–4.500)

## 2018-12-14 ENCOUNTER — Other Ambulatory Visit: Payer: Self-pay | Admitting: Physician Assistant

## 2018-12-14 ENCOUNTER — Other Ambulatory Visit: Payer: Self-pay | Admitting: Family

## 2018-12-14 ENCOUNTER — Other Ambulatory Visit: Payer: Self-pay

## 2018-12-18 MED FILL — OLMSRTN-AMLDPN-HCTZ 40-5-12: 40-5-12.5 | 30 days supply | Qty: 30 | Fill #5

## 2018-12-18 NOTE — Progress Notes (Signed)
Patient's chart reviewed with Dr Conrad Santa Claus, BMI 53.8, having Haglund's deformity on foot. She will need to be done [facility] due to BMI>50. Cheryl at Dr Jess Barters office notified.

## 2018-12-20 ENCOUNTER — Telehealth: Payer: Self-pay | Admitting: Orthopedic Surgery

## 2018-12-20 NOTE — Telephone Encounter (Signed)
Left message on patient's voice mail [phone] to call and ask for me.   This is in regards to rescheduling her surgery with Dr. Sharol Given at [facility] on 12-25-18.  This surgery will need to be done at [facility].

## 2018-12-21 ENCOUNTER — Other Ambulatory Visit (HOSPITAL_COMMUNITY)
Admission: RE | Admit: 2018-12-21 | Discharge: 2018-12-21 | Disposition: A | Discharge status: Home / Self Care | Payer: 59 | Source: Ambulatory Visit | Attending: Orthopedic Surgery | Admitting: Orthopedic Surgery

## 2018-12-21 DIAGNOSIS — Z20828 Contact with and (suspected) exposure to other viral communicable diseases: Secondary | ICD-10-CM | POA: Diagnosis not present

## 2018-12-21 DIAGNOSIS — Z01812 Encounter for preprocedural laboratory examination: Secondary | ICD-10-CM | POA: Insufficient documentation

## 2018-12-22 LAB — NOVEL CORONAVIRUS, NAA (HOSP ORDER, SEND-OUT TO REF LAB; TAT 18-24 HRS): SARS-CoV-2, NAA: NOT DETECTED

## 2018-12-26 ENCOUNTER — Telehealth: Payer: Self-pay | Admitting: Orthopedic Surgery

## 2018-12-26 NOTE — Telephone Encounter (Signed)
I called and lm on vm to advise that we do not have knee scooters here in the office but that she can rent one from [facility]. Insurance does not cover the cost but they have several she can choose from and do week to week or monthly rentals. Lm on vm to call with concerns.

## 2018-12-26 NOTE — Telephone Encounter (Signed)
Patient is scheduled for right foot surgery 01-04-19 at [facility].  Patient was rescheduled from [facility] due to BMI of 53.  Patient is requesting knee scooter.  Post op visit on 01-14-19 with Dr. Sharol Given.   cb  [phone]

## 2019-01-01 ENCOUNTER — Other Ambulatory Visit (HOSPITAL_COMMUNITY)
Admission: RE | Admit: 2019-01-01 | Discharge: 2019-01-01 | Disposition: A | Discharge status: Home / Self Care | Payer: 59 | Source: Ambulatory Visit | Attending: Orthopedic Surgery | Admitting: Orthopedic Surgery

## 2019-01-01 DIAGNOSIS — Z20828 Contact with and (suspected) exposure to other viral communicable diseases: Secondary | ICD-10-CM | POA: Diagnosis not present

## 2019-01-01 DIAGNOSIS — Z01812 Encounter for preprocedural laboratory examination: Secondary | ICD-10-CM | POA: Insufficient documentation

## 2019-01-02 LAB — NOVEL CORONAVIRUS, NAA (HOSP ORDER, SEND-OUT TO REF LAB; TAT 18-24 HRS): SARS-CoV-2, NAA: NOT DETECTED

## 2019-01-03 ENCOUNTER — Encounter (HOSPITAL_COMMUNITY): Payer: Self-pay | Admitting: *Deleted

## 2019-01-03 MED ORDER — DEXTROSE 5 % IV SOLN
3.0000 g | INTRAVENOUS | Status: AC
  Administered 2019-01-04: 3 g via INTRAVENOUS
  Filled 2019-01-03: qty 3
  Filled 2019-01-03: qty 3000

## 2019-01-03 NOTE — Progress Notes (Signed)
Kerry Phillips denies chest pain or shortness of breath. Patient was tested for Covid and has been in quarantine with her husband.  I instructed patient to not eat after midnight, but may drink clear liquids until 0430- we went over what are clear liquids; Kerry Phillips said that she will drink water.

## 2019-01-04 ENCOUNTER — Encounter (HOSPITAL_COMMUNITY)
Admission: RE | Disposition: A | Discharge status: Home / Self Care | Payer: Self-pay | Source: Home / Self Care | Attending: Orthopedic Surgery

## 2019-01-04 ENCOUNTER — Ambulatory Visit (HOSPITAL_COMMUNITY): Payer: 59 | Admitting: Certified Registered Nurse Anesthetist

## 2019-01-04 ENCOUNTER — Other Ambulatory Visit: Payer: Self-pay

## 2019-01-04 ENCOUNTER — Encounter (HOSPITAL_COMMUNITY): Payer: Self-pay

## 2019-01-04 ENCOUNTER — Ambulatory Visit (HOSPITAL_COMMUNITY)
Admission: RE | Admit: 2019-01-04 | Discharge: 2019-01-04 | Disposition: A | Discharge status: Home / Self Care | Payer: 59 | Attending: Orthopedic Surgery | Admitting: Orthopedic Surgery

## 2019-01-04 DIAGNOSIS — E890 Postprocedural hypothyroidism: Secondary | ICD-10-CM | POA: Diagnosis not present

## 2019-01-04 DIAGNOSIS — E039 Hypothyroidism, unspecified: Secondary | ICD-10-CM | POA: Diagnosis not present

## 2019-01-04 DIAGNOSIS — Z90710 Acquired absence of both cervix and uterus: Secondary | ICD-10-CM | POA: Diagnosis not present

## 2019-01-04 DIAGNOSIS — M170 Bilateral primary osteoarthritis of knee: Secondary | ICD-10-CM | POA: Diagnosis not present

## 2019-01-04 DIAGNOSIS — M9261 Juvenile osteochondrosis of tarsus, right ankle: Secondary | ICD-10-CM | POA: Diagnosis not present

## 2019-01-04 DIAGNOSIS — Z801 Family history of malignant neoplasm of trachea, bronchus and lung: Secondary | ICD-10-CM | POA: Insufficient documentation

## 2019-01-04 DIAGNOSIS — M7751 Other enthesopathy of right foot: Secondary | ICD-10-CM | POA: Diagnosis not present

## 2019-01-04 DIAGNOSIS — Z8489 Family history of other specified conditions: Secondary | ICD-10-CM | POA: Diagnosis not present

## 2019-01-04 DIAGNOSIS — Z791 Long term (current) use of non-steroidal anti-inflammatories (NSAID): Secondary | ICD-10-CM | POA: Diagnosis not present

## 2019-01-04 DIAGNOSIS — Z79899 Other long term (current) drug therapy: Secondary | ICD-10-CM | POA: Diagnosis not present

## 2019-01-04 DIAGNOSIS — M1990 Unspecified osteoarthritis, unspecified site: Secondary | ICD-10-CM | POA: Insufficient documentation

## 2019-01-04 DIAGNOSIS — Z803 Family history of malignant neoplasm of breast: Secondary | ICD-10-CM | POA: Insufficient documentation

## 2019-01-04 DIAGNOSIS — Z8249 Family history of ischemic heart disease and other diseases of the circulatory system: Secondary | ICD-10-CM | POA: Insufficient documentation

## 2019-01-04 DIAGNOSIS — Z823 Family history of stroke: Secondary | ICD-10-CM | POA: Insufficient documentation

## 2019-01-04 DIAGNOSIS — M216X1 Other acquired deformities of right foot: Secondary | ICD-10-CM | POA: Diagnosis not present

## 2019-01-04 DIAGNOSIS — I10 Essential (primary) hypertension: Secondary | ICD-10-CM | POA: Diagnosis not present

## 2019-01-04 HISTORY — DX: Unspecified osteoarthritis, unspecified site: M19.90

## 2019-01-04 HISTORY — PX: ARTHROSCOPIC HAGLUNDS REPAIR: SHX5187

## 2019-01-04 HISTORY — DX: Personal history of other medical treatment: Z92.89

## 2019-01-04 HISTORY — DX: Hypothyroidism, unspecified: E03.9

## 2019-01-04 LAB — BASIC METABOLIC PANEL
Anion gap: 11 (ref 5–15)
BUN: 9 mg/dL (ref 8–23)
CO2: 26 mmol/L (ref 22–32)
Calcium: 8.2 mg/dL — ABNORMAL LOW (ref 8.9–10.3)
Chloride: 102 mmol/L (ref 98–111)
Creatinine, Ser: 0.94 mg/dL (ref 0.44–1.00)
GFR calc Af Amer: >60 mL/min (ref >60)
GFR calc non Af Amer: >60 mL/min (ref >60)
Glucose, Bld: 97 mg/dL (ref 70–99)
Potassium: 3.3 mmol/L — ABNORMAL LOW (ref 3.5–5.1)
Sodium: 139 mmol/L (ref 135–145)

## 2019-01-04 LAB — CBC
HCT: 37.8 % (ref 36.0–46.0)
Hemoglobin: 12.3 g/dL (ref 12.0–15.0)
MCH: 27.8 pg (ref 26.0–34.0)
MCHC: 32.5 g/dL (ref 30.0–36.0)
MCV: 85.3 fL (ref 80.0–100.0)
Platelets: 298 10*3/uL (ref 150–400)
RBC: 4.43 MIL/uL (ref 3.87–5.11)
RDW: 13.8 % (ref 11.5–15.5)
WBC: 6.5 10*3/uL (ref 4.0–10.5)
nRBC: 0 % (ref 0.0–0.2)

## 2019-01-04 SURGERY — ARTHROSCOPIC HAGLUNDS/PUMP BUMP
Anesthesia: General | Site: Foot | Laterality: Right

## 2019-01-04 MED ORDER — HYDROCODONE-ACETAMINOPHEN 5-325 MG PO TABS
1.0000 | ORAL_TABLET | Freq: Once | ORAL | Status: AC
  Administered 2019-01-04: 1 via ORAL

## 2019-01-04 MED ORDER — ONDANSETRON HCL 4 MG/2ML IJ SOLN
INTRAMUSCULAR | Status: DC | PRN
  Administered 2019-01-04: 4 mg via INTRAVENOUS

## 2019-01-04 MED ORDER — ONDANSETRON HCL 4 MG/2ML IJ SOLN: 4.0000 mg | Freq: Once | INTRAMUSCULAR | Status: DC | PRN

## 2019-01-04 MED ORDER — SUGAMMADEX SODIUM 500 MG/5ML IV SOLN
INTRAVENOUS | Status: DC | PRN
  Administered 2019-01-04: 500 mg via INTRAVENOUS

## 2019-01-04 MED ORDER — MIDAZOLAM HCL 5 MG/5ML IJ SOLN
INTRAMUSCULAR | Status: DC | PRN
  Administered 2019-01-04: 2 mg via INTRAVENOUS

## 2019-01-04 MED ORDER — LIDOCAINE 2% (20 MG/ML) 5 ML SYRINGE
INTRAMUSCULAR | Status: DC | PRN
  Administered 2019-01-04: 100 mg via INTRAVENOUS

## 2019-01-04 MED ORDER — CHLORHEXIDINE GLUCONATE 4 % EX LIQD: 60.0000 mL | Freq: Once | CUTANEOUS | Status: DC

## 2019-01-04 MED ORDER — LIDOCAINE 2% (20 MG/ML) 5 ML SYRINGE
INTRAMUSCULAR | Status: AC
  Filled 2019-01-04: qty 5

## 2019-01-04 MED ORDER — FENTANYL CITRATE (PF) 250 MCG/5ML IJ SOLN
INTRAMUSCULAR | Status: DC | PRN
  Administered 2019-01-04: 25 ug via INTRAVENOUS
  Administered 2019-01-04: 150 ug via INTRAVENOUS

## 2019-01-04 MED ORDER — DEXAMETHASONE SODIUM PHOSPHATE 10 MG/ML IJ SOLN
INTRAMUSCULAR | Status: AC
  Filled 2019-01-04: qty 1

## 2019-01-04 MED ORDER — PROPOFOL 10 MG/ML IV BOLUS
INTRAVENOUS | Status: DC | PRN
  Administered 2019-01-04: 110 mg via INTRAVENOUS

## 2019-01-04 MED ORDER — MIDAZOLAM HCL 2 MG/2ML IJ SOLN
INTRAMUSCULAR | Status: AC
  Filled 2019-01-04: qty 2

## 2019-01-04 MED ORDER — MEPERIDINE HCL 25 MG/ML IJ SOLN: 6.2500 mg | INTRAMUSCULAR | Status: DC | PRN

## 2019-01-04 MED ORDER — ROCURONIUM BROMIDE 10 MG/ML (PF) SYRINGE
PREFILLED_SYRINGE | INTRAVENOUS | Status: AC
  Filled 2019-01-04: qty 10

## 2019-01-04 MED ORDER — ONDANSETRON HCL 4 MG/2ML IJ SOLN
INTRAMUSCULAR | Status: AC
  Filled 2019-01-04: qty 2

## 2019-01-04 MED ORDER — HYDROCODONE-ACETAMINOPHEN 5-325 MG PO TABS
ORAL_TABLET | ORAL | Status: AC
  Filled 2019-01-04: qty 1

## 2019-01-04 MED ORDER — BUPIVACAINE HCL (PF) 0.25 % IJ SOLN
INTRAMUSCULAR | Status: AC
  Filled 2019-01-04: qty 30

## 2019-01-04 MED ORDER — LACTATED RINGERS IV SOLN
INTRAVENOUS | Status: DC | PRN
  Administered 2019-01-04: 08:00:00 via INTRAVENOUS

## 2019-01-04 MED ORDER — HYDROCODONE-ACETAMINOPHEN 5-325 MG PO TABS: 1.0000 | ORAL_TABLET | Freq: Four times a day (QID) | ORAL | 0 refills | Status: DC | PRN

## 2019-01-04 MED ORDER — SODIUM CHLORIDE 0.9 % IR SOLN
Status: DC | PRN
  Administered 2019-01-04: 3000 mL

## 2019-01-04 MED ORDER — PROPOFOL 10 MG/ML IV BOLUS
INTRAVENOUS | Status: AC
  Filled 2019-01-04: qty 20

## 2019-01-04 MED ORDER — HYDROMORPHONE HCL 1 MG/ML IJ SOLN
INTRAMUSCULAR | Status: AC
  Administered 2019-01-04: 0.5 mg via INTRAVENOUS
  Filled 2019-01-04: qty 1

## 2019-01-04 MED ORDER — HYDROMORPHONE HCL 1 MG/ML IJ SOLN
0.2500 mg | INTRAMUSCULAR | Status: DC | PRN
  Administered 2019-01-04: 09:00:00 0.5 mg via INTRAVENOUS

## 2019-01-04 MED ORDER — DEXAMETHASONE SODIUM PHOSPHATE 10 MG/ML IJ SOLN
INTRAMUSCULAR | Status: DC | PRN
  Administered 2019-01-04: 10 mg via INTRAVENOUS

## 2019-01-04 MED ORDER — PHENYLEPHRINE 40 MCG/ML (10ML) SYRINGE FOR IV PUSH (FOR BLOOD PRESSURE SUPPORT)
PREFILLED_SYRINGE | INTRAVENOUS | Status: DC | PRN
  Administered 2019-01-04 (×4): 80 ug via INTRAVENOUS

## 2019-01-04 MED ORDER — FENTANYL CITRATE (PF) 250 MCG/5ML IJ SOLN
INTRAMUSCULAR | Status: AC
  Filled 2019-01-04: qty 5

## 2019-01-04 MED ORDER — ROCURONIUM BROMIDE 10 MG/ML (PF) SYRINGE
PREFILLED_SYRINGE | INTRAVENOUS | Status: DC | PRN
  Administered 2019-01-04: 100 mg via INTRAVENOUS

## 2019-01-04 MED FILL — HYDROCODON-APAP 5-325: 5-325 | 8 days supply | Qty: 30 | Fill #0

## 2019-01-04 SURGICAL SUPPLY — 46 items
BLADE CUDA 5.5 (BLADE) IMPLANT
BLADE EXCALIBUR 4.0MM X 13CM (MISCELLANEOUS) ×1
BLADE EXCALIBUR 4.0X13 (MISCELLANEOUS) ×2 IMPLANT
BNDG COHESIVE 4X5 TAN STRL (GAUZE/BANDAGES/DRESSINGS) ×3 IMPLANT
BNDG COHESIVE 6X5 TAN STRL LF (GAUZE/BANDAGES/DRESSINGS) ×3 IMPLANT
BNDG GAUZE ELAST 4 BULKY (GAUZE/BANDAGES/DRESSINGS) ×3 IMPLANT
BUR OVAL 4.0 (BURR) IMPLANT
BURR OVAL 8 FLU 5.0MM X 13CM (MISCELLANEOUS) ×1
BURR OVAL 8 FLU 5.0X13 (MISCELLANEOUS) ×2 IMPLANT
COVER SURGICAL LIGHT HANDLE (MISCELLANEOUS) ×6 IMPLANT
COVER WAND RF STERILE (DRAPES) ×3 IMPLANT
CUFF TOURN SGL QUICK 34 (TOURNIQUET CUFF)
CUFF TOURN SGL QUICK 42 (TOURNIQUET CUFF) IMPLANT
CUFF TRNQT CYL 34X4.125X (TOURNIQUET CUFF) IMPLANT
DRAPE ARTHROSCOPY W/POUCH 114 (DRAPES) ×3 IMPLANT
DRAPE OEC MINIVIEW 54X84 (DRAPES) IMPLANT
DRAPE U-SHAPE 47X51 STRL (DRAPES) ×3 IMPLANT
DRSG EMULSION OIL 3X3 NADH (GAUZE/BANDAGES/DRESSINGS) ×3 IMPLANT
DRSG PAD ABDOMINAL 8X10 ST (GAUZE/BANDAGES/DRESSINGS) ×3 IMPLANT
DURAPREP 26ML APPLICATOR (WOUND CARE) ×3 IMPLANT
GAUZE SPONGE 4X4 12PLY STRL (GAUZE/BANDAGES/DRESSINGS) ×3 IMPLANT
GLOVE BIOGEL PI IND STRL 9 (GLOVE) ×1 IMPLANT
GLOVE BIOGEL PI INDICATOR 9 (GLOVE) ×2
GLOVE SURG ORTHO 9.0 STRL STRW (GLOVE) ×3 IMPLANT
GOWN STRL REUS W/ TWL XL LVL3 (GOWN DISPOSABLE) ×3 IMPLANT
GOWN STRL REUS W/TWL XL LVL3 (GOWN DISPOSABLE) ×6
KIT BASIN OR (CUSTOM PROCEDURE TRAY) ×3 IMPLANT
KIT TURNOVER KIT B (KITS) ×3 IMPLANT
MANIFOLD NEPTUNE II (INSTRUMENTS) ×3 IMPLANT
NEEDLE 18GX1X1/2 (RX/OR ONLY) (NEEDLE) ×3 IMPLANT
PACK ARTHROSCOPY DSU (CUSTOM PROCEDURE TRAY) ×3 IMPLANT
PAD ARMBOARD 7.5X6 YLW CONV (MISCELLANEOUS) ×6 IMPLANT
PADDING CAST COTTON 6X4 STRL (CAST SUPPLIES) ×3 IMPLANT
SPONGE LAP 4X18 RFD (DISPOSABLE) ×3 IMPLANT
SUT ETHILON 2 0 PSLX (SUTURE) ×3 IMPLANT
SUT ETHILON 4 0 PS 2 18 (SUTURE) ×3 IMPLANT
SUT MNCRL AB 3-0 PS2 18 (SUTURE) IMPLANT
SUT VIC AB 2-0 CT1 27 (SUTURE)
SUT VIC AB 2-0 CT1 TAPERPNT 27 (SUTURE) IMPLANT
SYR 20ML LL LF (SYRINGE) ×3 IMPLANT
TAPE STRIPS DRAPE STRL (GAUZE/BANDAGES/DRESSINGS) IMPLANT
TOWEL GREEN STERILE (TOWEL DISPOSABLE) ×3 IMPLANT
TOWEL GREEN STERILE FF (TOWEL DISPOSABLE) ×3 IMPLANT
TUBING ARTHROSCOPY IRRIG 16FT (MISCELLANEOUS) ×3 IMPLANT
WAND STAR VAC 90 (SURGICAL WAND) IMPLANT
WATER STERILE IRR 1000ML POUR (IV SOLUTION) ×3 IMPLANT

## 2019-01-04 NOTE — Op Note (Signed)
01/04/2019  8:40 AM  PATIENT:  Kerry Phillips    PRE-OPERATIVE DIAGNOSIS:  Haglunds Deformity Right Calcaneous  POST-OPERATIVE DIAGNOSIS:  Same  PROCEDURE:  ENDOSCOPIC RESECTION HAGLUNDS DEFORMITY  SURGEON:  Newt Minion, MD  PHYSICIAN ASSISTANT:None ANESTHESIA:   General  PREOPERATIVE INDICATIONS:  [name] is a  63 y.o. female with a diagnosis of Haglunds Deformity Right Calcaneous who failed conservative measures and elected for surgical management.    The risks benefits and alternatives were discussed with the patient preoperatively including but not limited to the risks of infection, bleeding, nerve injury, cardiopulmonary complications, the need for revision surgery, among others, and the patient was willing to proceed.  OPERATIVE IMPLANTS: None  @ENCIMAGES @  OPERATIVE FINDINGS: Haglund's deformity resected and retrocalcaneal bursitis resected as well.  Patient was brought the operating underwent a general anesthetic.  After adequate levels anesthesia were obtained patient was placed prone on the operating table  OPERATIVE PROCEDURE: In the right lower extremity was prepped using DuraPrep draped into a sterile field a timeout was called.  A portal was made posterior medially and posterior laterally.  These were interchanged with working and arthroscopic portals.  Initially the electrical wand was used to debride the bursitis.  The shaver was then used under direct visualization and the remainder of the bursa was resected.  Patient had a Haglund's deformity and using the shaver and the bur the Haglund's deformity was resected until it was flat.  The electrical wand was used to further debride around the edges of the calcaneus.  The calcaneus was flat there is no Haglund's deformities the Achilles tendon was intact.  The instruments were removed the portals were closed using 2-0 nylon a sterile compressive dressing was applied patient was extubated taken the PACU in stable  condition.   DISCHARGE PLANNING:  Antibiotic duration: Preoperative antibiotics 3 g Kefzol  Weightbearing: Weightbearing as tolerated cam boot  Pain medication: Prescription for Vicodin  Dressing care/ Wound VAC: Follow-up in 1 week to change the dressing  Ambulatory devices: Crutches  Discharge to: Home.  Follow-up: In the office 1 week post operative.

## 2019-01-04 NOTE — Progress Notes (Signed)
Orthopedic Tech Progress Note Patient Details:  Kerry Phillips 12-14-1955 [id number]  Ortho Devices Type of Ortho Device: CAM walker Ortho Device/Splint Location: right Ortho Device/Splint Interventions: Application   Post Interventions Patient Tolerated: Well Instructions Provided: Care of device   [name] 01/04/2019, 11:01 AM

## 2019-01-04 NOTE — Anesthesia Preprocedure Evaluation (Signed)
Anesthesia Evaluation  Patient identified by MRN, date of birth, ID band Patient awake    Reviewed: Allergy & Precautions, NPO status , Patient's Chart, lab work & pertinent test results  Airway Mallampati: I  TM Distance: >3 FB Neck ROM: Full    Dental   Pulmonary    Pulmonary exam normal        Cardiovascular hypertension, Pt. on medications Normal cardiovascular exam     Neuro/Psych    GI/Hepatic   Endo/Other    Renal/GU      Musculoskeletal   Abdominal   Peds  Hematology   Anesthesia Other Findings   Reproductive/Obstetrics                             Anesthesia Physical Anesthesia Plan  ASA: II  Anesthesia Plan: General   Post-op Pain Management:    Induction: Intravenous  PONV Risk Score and Plan: 3 and Ondansetron, Midazolam and Dexamethasone  Airway Management Planned: LMA  Additional Equipment:   Intra-op Plan:   Post-operative Plan: Extubation in OR  Informed Consent: I have reviewed the patients History and Physical, chart, labs and discussed the procedure including the risks, benefits and alternatives for the proposed anesthesia with the patient or authorized representative who has indicated his/her understanding and acceptance.     Plan Discussed with: CRNA and Surgeon  Anesthesia Plan Comments:         Anesthesia Quick Evaluation  

## 2019-01-04 NOTE — Anesthesia Procedure Notes (Addendum)
Procedure Name: Intubation Date/Time: 01/04/2019 7:42 AM Performed by: Milford Cage, CRNA Pre-anesthesia Checklist: Patient identified, Emergency Drugs available, Suction available, Patient being monitored and Timeout performed Patient Re-evaluated:Patient Re-evaluated prior to induction Oxygen Delivery Method: Circle system utilized Preoxygenation: Pre-oxygenation with 100% oxygen Induction Type: IV induction Ventilation: Mask ventilation without difficulty Laryngoscope Size: Mac and 3 Grade View: Grade I Tube type: Oral Tube size: 7.0 mm Number of attempts: 1 Airway Equipment and Method: Stylet Placement Confirmation: ETT inserted through vocal cords under direct vision,  positive ETCO2 and breath sounds checked- equal and bilateral Secured at: 20 cm Tube secured with: Tape Dental Injury: Teeth and Oropharynx as per pre-operative assessment

## 2019-01-04 NOTE — Anesthesia Postprocedure Evaluation (Signed)
Anesthesia Post Note  Patient: Kerry Phillips  Procedure(s) Performed: ENDOSCOPIC RESECTION HAGLUNDS DEFORMITY (Right Foot)     Patient location during evaluation: PACU Anesthesia Type: General Level of consciousness: awake and alert Pain management: pain level controlled Vital Signs Assessment: post-procedure vital signs reviewed and stable Respiratory status: spontaneous breathing, nonlabored ventilation, respiratory function stable and patient connected to nasal cannula oxygen Cardiovascular status: blood pressure returned to baseline and stable Postop Assessment: no apparent nausea or vomiting Anesthetic complications: no    Last Vitals:  Vitals:   01/04/19 0855 01/04/19 0925  BP: (!) 148/97 131/85  Pulse:  89  Resp: (!) 35 13  Temp: 36.4 C 36.4 C  SpO2: 99% 99%    Last Pain:  Vitals:   01/04/19 0925  TempSrc:   PainSc: 3                  Rian Koon DAVID

## 2019-01-04 NOTE — H&P (Signed)
Kerry Phillips is an 63 y.o. female.   Chief Complaint:  HPI: Right Foot Pain HPI: Patient is a 63 year old woman who presents complaining of Haglund's deformity bilaterally right worse than left she states she is tried inserts Achilles stretching without relief.  Patient states she has pain with activities of daily living.  She has even tried CBD lotion.  Past Medical History:  Diagnosis Date  . Arthritis   . History of blood transfusion    with childbirth  . Hypothyroidism    surgical    Past Surgical History:  Procedure Laterality Date  . ABDOMINAL HYSTERECTOMY    . COLONOSCOPY    . THYROIDECTOMY     90% removed    Family History  Problem Relation Age of Onset  . Hypertension Mother   . Cancer Father        Lung  . Stroke Sister   . Hypertension Sister   . Hypertension Daughter   . Hypertension Son   . Lupus Sister   . Breast cancer Sister    Social History:  reports that she has never smoked. She has never used smokeless tobacco. She reports that she does not drink alcohol or use drugs.  Allergies: No Known Allergies  Medications Prior to Admission  Medication Sig Dispense Refill  . calcitRIOL (ROCALTROL) 0.5 MCG capsule Take 1 capsule (0.5 mcg total) by mouth 2 (two) times daily. 180 capsule 4  . ibuprofen (ADVIL) 400 MG tablet Take 400 mg by mouth every 6 (six) hours as needed.    . Iron-Vitamins (GERITOL TONIC PO) Take 5 mLs by mouth daily.    Marland Kitchen levothyroxine (SYNTHROID) 88 MCG tablet Take 1 tablet (88 mcg total) by mouth daily before breakfast. 90 tablet 4  . Olmesartan-amLODIPine-HCTZ 40-5-12.5 MG TABS TAKE 1 TABLET BY MOUTH ONCE DAILY 90 tablet 4    Results for orders placed or performed during the hospital encounter of 01/04/19 (from the past 48 hour(s))  CBC     Status: None   Collection Time: 01/04/19  6:32 AM  Result Value Ref Range   WBC 6.5 4.0 - 10.5 K/uL   RBC 4.43 3.87 - 5.11 MIL/uL   Hemoglobin 12.3 12.0 - 15.0 g/dL   HCT 93.5 70.1 - 77.9 %    MCV 85.3 80.0 - 100.0 fL   MCH 27.8 26.0 - 34.0 pg   MCHC 32.5 30.0 - 36.0 g/dL   RDW 39.0 30.0 - 92.3 %   Platelets 298 150 - 400 K/uL   nRBC 0.0 0.0 - 0.2 %    Comment: Performed at [facility], [address], [place], [place] [address]   No results found.  ROS  Blood pressure 137/75, pulse 88, temperature 98.8 F (37.1 C), temperature source Oral, resp. rate 18, height 5' 4.5" (1.638 m), weight 126.6 kg, SpO2 97 %. Physical Exam  Patient is alert, oriented, no adenopathy, well-dressed, normal affect, normal respiratory effort. Examination patient has a Haglund's deformity worse on the right than the left.  She has dorsiflexion about 10 degrees bilaterally.  Her abdomen is deformity is tender to palpation on the right the Achilles tendon is not tender to palpation proximally.  There is no redness no cellulitis no signs of infection. Assessment/Plan Plan: Discussed continued nonoperative treatment versus surgical intervention for endoscopic Haglund resection.  Risk and benefits were discussed including infection neurovascular injury nonhealing of the skin rupture of the Achilles need for additional surgery.  Patient states she understands wished to  proceed at this time.   Bevely Palmer Everest Hacking, PA 01/04/2019, 7:12 AM

## 2019-01-04 NOTE — Transfer of Care (Signed)
Immediate Anesthesia Transfer of Care Note  Patient: Kerry Phillips  Procedure(s) Performed: ENDOSCOPIC RESECTION HAGLUNDS DEFORMITY (Right Foot)  Patient Location: PACU  Anesthesia Type:General  Level of Consciousness: drowsy  Airway & Oxygen Therapy: Patient Spontanous Breathing and Patient connected to face mask oxygen  Post-op Assessment: Report given to RN and Post -op Vital signs reviewed and stable  Post vital signs: Reviewed and stable  Last Vitals:  Vitals Value Taken Time  BP 148/97 01/04/19 0852  Temp    Pulse    Resp 18 01/04/19 0853  SpO2    Vitals shown include unvalidated device data.  Last Pain:  Vitals:   01/04/19 0938  TempSrc: Oral  PainSc: 0-No pain      Patients Stated Pain Goal: 3 (18/29/93 7169)  Complications: No apparent anesthesia complications

## 2019-01-05 ENCOUNTER — Encounter (HOSPITAL_COMMUNITY): Payer: Self-pay | Admitting: Orthopedic Surgery

## 2019-01-09 ENCOUNTER — Other Ambulatory Visit: Payer: Self-pay

## 2019-01-14 ENCOUNTER — Encounter: Payer: Self-pay | Admitting: Orthopedic Surgery

## 2019-01-14 ENCOUNTER — Ambulatory Visit (INDEPENDENT_AMBULATORY_CARE_PROVIDER_SITE_OTHER): Payer: 59 | Admitting: Orthopedic Surgery

## 2019-01-14 DIAGNOSIS — M9261 Juvenile osteochondrosis of tarsus, right ankle: Secondary | ICD-10-CM

## 2019-01-14 NOTE — Progress Notes (Signed)
Office Visit Note   Patient: Kerry Phillips           Date of Birth: [birth year]           MRN: [id number] Visit Date: 01/14/2019              Requested by: Steele Sizer, MD [address] [place],  [place] [address] PCP: Steele Sizer, MD  Chief Complaint  Patient presents with  . Right Foot - Pain, Routine Post Op      HPI: 10 days s/p endoscopic takedown of Haglunds Deformity. Not requiring pain medication. Denies Calf Pain  Assessment & Plan: Visit Diagnoses: No diagnosis found.  Plan: Follow up 1 week for suture removel  Follow-Up Instructions: No follow-ups on file.   Ortho Exam  Patient is alert, oriented, no adenopathy, well-dressed, normal affect, normal respiratory effort. Incisions healing well. DistalCMS is intact. Achilles is intact, swelling controlled Compartments soft. No erythema or drainage  Imaging: No results found. No images are attached to the encounter.  Labs: No results found for: HGBA1C, ESRSEDRATE, CRP, LABURIC, REPTSTATUS, GRAMSTAIN, CULT, LABORGA   Lab Results  Component Value Date   ALBUMIN 4.4 09/25/2018   ALBUMIN 4.2 09/07/2017   ALBUMIN 4.6 04/05/2016    No results found for: MG No results found for: VD25OH  No results found for: PREALBUMIN CBC EXTENDED Latest Ref Rng & Units 01/04/2019 09/25/2018 09/07/2017  WBC 4.0 - 10.5 K/uL 6.5 6.2 7.2  RBC 3.87 - 5.11 MIL/uL 4.43 4.31 4.10  HGB 12.0 - 15.0 g/dL 69.4 85.4 62.7  HCT 03.5 - 46.0 % 37.8 35.7 34.7  PLT 150 - 400 K/uL 298 322 315  NEUTROABS 1.4 - 7.0 x10E3/uL - 4.1 4.4  LYMPHSABS 0.7 - 3.1 x10E3/uL - 1.5 2.2     There is no height or weight on file to calculate BMI.  Orders:  No orders of the defined types were placed in this encounter.  No orders of the defined types were placed in this encounter.    Procedures: No procedures performed  Clinical Data: No additional findings.  ROS:  All other systems negative, except as noted in the HPI. Review of Systems   Objective: Vital Signs: There were no vitals taken for this visit.  Specialty Comments:  No specialty comments available.  PMFS History: Patient Active Problem List   Diagnosis Date Noted  . Haglund's deformity of right heel   . Morbid obesity (HCC) 03/22/2018  . Hypocalcemia 09/07/2017  . Dysphagia 01/17/2017  . BMI 50.0-59.9, adult (HCC) 04/05/2016  . Arthritis of both knees 04/05/2016  . Essential hypertension 12/22/2014  . Hypothyroidism 12/22/2014   Past Medical History:  Diagnosis Date  . Arthritis   . History of blood transfusion    with childbirth  . Hypothyroidism    surgical    Family History  Problem Relation Age of Onset  . Hypertension Mother   . Cancer Father        Lung  . Stroke Sister   . Hypertension Sister   . Hypertension Daughter   . Hypertension Son   . Lupus Sister   . Breast cancer Sister     Past Surgical History:  Procedure Laterality Date  . ABDOMINAL HYSTERECTOMY    . ARTHROSCOPIC HAGLUNDS REPAIR Right 01/04/2019   Procedure: ENDOSCOPIC RESECTION HAGLUNDS DEFORMITY;  Surgeon: Nadara Mustard, MD;  Location: [facility] OR;  Service: Orthopedics;  Laterality: Right;  . COLONOSCOPY    . THYROIDECTOMY  90% removed   Social History   Occupational History  . Not on file  Tobacco Use  . Smoking status: Never Smoker  . Smokeless tobacco: Never Used  Substance and Sexual Activity  . Alcohol use: No  . Drug use: Never  . Sexual activity: Yes

## 2019-01-21 ENCOUNTER — Other Ambulatory Visit: Payer: Self-pay

## 2019-01-21 ENCOUNTER — Encounter: Payer: Self-pay | Admitting: Orthopedic Surgery

## 2019-01-21 ENCOUNTER — Ambulatory Visit (INDEPENDENT_AMBULATORY_CARE_PROVIDER_SITE_OTHER): Payer: 59 | Admitting: Orthopedic Surgery

## 2019-01-21 VITALS — Ht 64.0 in | Wt 279.0 lb

## 2019-01-21 DIAGNOSIS — M9261 Juvenile osteochondrosis of tarsus, right ankle: Secondary | ICD-10-CM

## 2019-01-22 ENCOUNTER — Encounter: Payer: Self-pay | Admitting: Orthopedic Surgery

## 2019-01-22 NOTE — Progress Notes (Signed)
Office Visit Note   Patient: Kerry Phillips           Date of Birth: 04-Sep-1955           MRN: [id number] Visit Date: 01/21/2019              Requested by: Guadalupe Maple, MD [address] [place],  [place] [address] PCP: Guadalupe Maple, MD  Chief Complaint  Patient presents with  . Right Foot - Routine Post Op    01/04/19 right endoscopic take down Haglund's deformity.       HPI: Patient is a 63 year old woman who presents 2 weeks status post endoscopic resection Haglund's deformity right foot she is currently been in a fracture boot in a kneeling scooter.  Assessment & Plan: Visit Diagnoses:  1. Haglund's deformity of right heel     Plan: Patient will increase her activities as tolerated wean off the scooter then wean out of the fracture boot she may drive when she can walk in regular shoes.  Patient is given instructions for Achilles stretching.  Follow-Up Instructions: Return in about 2 weeks (around 02/04/2019).   Ortho Exam  Patient is alert, oriented, no adenopathy, well-dressed, normal affect, normal respiratory effort. Examination the incisions are well-healed sutures are harvested.  Patient has dorsiflexion to neutral and she was given instructions for Achilles stretching there is no tenderness to palpation along the Achilles no tenderness to palpation at the insertion.  Imaging: No results found. No images are attached to the encounter.  Labs: No results found for: HGBA1C, ESRSEDRATE, CRP, LABURIC, REPTSTATUS, GRAMSTAIN, CULT, LABORGA   Lab Results  Component Value Date   ALBUMIN 4.4 09/25/2018   ALBUMIN 4.2 09/07/2017   ALBUMIN 4.6 04/05/2016    No results found for: MG No results found for: VD25OH  No results found for: PREALBUMIN CBC EXTENDED Latest Ref Rng & Units 01/04/2019 09/25/2018 09/07/2017  WBC 4.0 - 10.5 K/uL 6.5 6.2 7.2  RBC 3.87 - 5.11 MIL/uL 4.43 4.31 4.10  HGB 12.0 - 15.0 g/dL 12.3 12.0 11.4  HCT 36.0 - 46.0 % 37.8 35.7 34.7  PLT 150  - 400 K/uL 298 322 315  NEUTROABS 1.4 - 7.0 x10E3/uL - 4.1 4.4  LYMPHSABS 0.7 - 3.1 x10E3/uL - 1.5 2.2     Body mass index is 47.89 kg/m.  Orders:  No orders of the defined types were placed in this encounter.  No orders of the defined types were placed in this encounter.    Procedures: No procedures performed  Clinical Data: No additional findings.  ROS:  All other systems negative, except as noted in the HPI. Review of Systems  Objective: Vital Signs: Ht 5\' 4"  (1.626 m)   Wt 279 lb (126.6 kg)   BMI 47.89 kg/m   Specialty Comments:  No specialty comments available.  PMFS History: Patient Active Problem List   Diagnosis Date Noted  . Haglund's deformity of right heel   . Morbid obesity ([place]) 03/22/2018  . Hypocalcemia 09/07/2017  . Dysphagia 01/17/2017  . BMI 50.0-59.9, adult ([place]) 04/05/2016  . Arthritis of both knees 04/05/2016  . Essential hypertension 12/22/2014  . Hypothyroidism 12/22/2014   Past Medical History:  Diagnosis Date  . Arthritis   . History of blood transfusion    with childbirth  . Hypothyroidism    surgical    Family History  Problem Relation Age of Onset  . Hypertension Mother   . Cancer Father  Lung  . Stroke Sister   . Hypertension Sister   . Hypertension Daughter   . Hypertension Son   . Lupus Sister   . Breast cancer Sister     Past Surgical History:  Procedure Laterality Date  . ABDOMINAL HYSTERECTOMY    . ARTHROSCOPIC HAGLUNDS REPAIR Right 01/04/2019   Procedure: ENDOSCOPIC RESECTION HAGLUNDS DEFORMITY;  Surgeon: Nadara Mustard, MD;  Location: [facility] OR;  Service: Orthopedics;  Laterality: Right;  . COLONOSCOPY    . THYROIDECTOMY     90% removed   Social History   Occupational History  . Not on file  Tobacco Use  . Smoking status: Never Smoker  . Smokeless tobacco: Never Used  Substance and Sexual Activity  . Alcohol use: No  . Drug use: Never  . Sexual activity: Yes

## 2019-02-01 MED FILL — OLMSRTN-AMLDPN-HCTZ 40-5-12: 40-5-12.5 | 30 days supply | Qty: 30 | Fill #6

## 2019-02-04 ENCOUNTER — Encounter: Payer: Self-pay | Admitting: Orthopedic Surgery

## 2019-02-04 ENCOUNTER — Other Ambulatory Visit: Payer: Self-pay

## 2019-02-04 ENCOUNTER — Ambulatory Visit (INDEPENDENT_AMBULATORY_CARE_PROVIDER_SITE_OTHER): Payer: 59 | Admitting: Orthopedic Surgery

## 2019-02-04 VITALS — Ht 64.0 in | Wt 279.0 lb

## 2019-02-04 DIAGNOSIS — M9261 Juvenile osteochondrosis of tarsus, right ankle: Secondary | ICD-10-CM

## 2019-02-04 NOTE — Progress Notes (Signed)
Office Visit Note   Patient: Kerry Phillips           Date of Birth: 1956/01/17           MRN: [id number] Visit Date: 02/04/2019              Requested by: Guadalupe Maple, MD [address] [place],  [place] [address] PCP: Guadalupe Maple, MD  Chief Complaint  Patient presents with  . Right Foot - Routine Post Op    01/04/19 right endoscopic take down Haglund's deformity      HPI: This is a pleasant woman who is now 1 month status post takedown of a Haglund's deformity on her right foot she has been coming out of her boot at home and she has been working on Achilles stretching.  Overall she feels she is improving  Assessment & Plan: Visit Diagnoses: No diagnosis found.  Plan: She will continue to wean out of her boot and continue her stretching and strengthening she will follow up in 1 month  Follow-Up Instructions: No follow-ups on file.   Ortho Exam  Patient is alert, oriented, no adenopathy, well-dressed, normal affect, normal respiratory effort. Right foot: Palpable pulse mild soft tissue swelling Achilles is intact she has a small amount of eschar over the incision but no surrounding erythema and no necrotic tissue she has dorsiflexion to 15 degrees past neutral  Imaging: No results found. No images are attached to the encounter.  Labs: No results found for: HGBA1C, ESRSEDRATE, CRP, LABURIC, REPTSTATUS, GRAMSTAIN, CULT, LABORGA   Lab Results  Component Value Date   ALBUMIN 4.4 09/25/2018   ALBUMIN 4.2 09/07/2017   ALBUMIN 4.6 04/05/2016    No results found for: MG No results found for: VD25OH  No results found for: PREALBUMIN CBC EXTENDED Latest Ref Rng & Units 01/04/2019 09/25/2018 09/07/2017  WBC 4.0 - 10.5 K/uL 6.5 6.2 7.2  RBC 3.87 - 5.11 MIL/uL 4.43 4.31 4.10  HGB 12.0 - 15.0 g/dL 12.3 12.0 11.4  HCT 36.0 - 46.0 % 37.8 35.7 34.7  PLT 150 - 400 K/uL 298 322 315  NEUTROABS 1.4 - 7.0 x10E3/uL - 4.1 4.4  LYMPHSABS 0.7 - 3.1 x10E3/uL - 1.5 2.2     Body  mass index is 47.89 kg/m.  Orders:  No orders of the defined types were placed in this encounter.  No orders of the defined types were placed in this encounter.    Procedures: No procedures performed  Clinical Data: No additional findings.  ROS:  All other systems negative, except as noted in the HPI. Review of Systems  Objective: Vital Signs: Ht 5\' 4"  (1.626 m)   Wt 279 lb (126.6 kg)   BMI 47.89 kg/m   Specialty Comments:  No specialty comments available.  PMFS History: Patient Active Problem List   Diagnosis Date Noted  . Haglund's deformity of right heel   . Morbid obesity ([place]) 03/22/2018  . Hypocalcemia 09/07/2017  . Dysphagia 01/17/2017  . BMI 50.0-59.9, adult ([place]) 04/05/2016  . Arthritis of both knees 04/05/2016  . Essential hypertension 12/22/2014  . Hypothyroidism 12/22/2014   Past Medical History:  Diagnosis Date  . Arthritis   . History of blood transfusion    with childbirth  . Hypothyroidism    surgical    Family History  Problem Relation Age of Onset  . Hypertension Mother   . Cancer Father        Lung  . Stroke Sister   .  Hypertension Sister   . Hypertension Daughter   . Hypertension Son   . Lupus Sister   . Breast cancer Sister     Past Surgical History:  Procedure Laterality Date  . ABDOMINAL HYSTERECTOMY    . ARTHROSCOPIC HAGLUNDS REPAIR Right 01/04/2019   Procedure: ENDOSCOPIC RESECTION HAGLUNDS DEFORMITY;  Surgeon: Nadara Mustard, MD;  Location: [facility] OR;  Service: Orthopedics;  Laterality: Right;  . COLONOSCOPY    . THYROIDECTOMY     90% removed   Social History   Occupational History  . Not on file  Tobacco Use  . Smoking status: Never Smoker  . Smokeless tobacco: Never Used  Substance and Sexual Activity  . Alcohol use: No  . Drug use: Never  . Sexual activity: Yes

## 2019-03-04 ENCOUNTER — Encounter: Payer: Self-pay | Admitting: Physician Assistant

## 2019-03-04 ENCOUNTER — Other Ambulatory Visit: Payer: Self-pay

## 2019-03-04 ENCOUNTER — Ambulatory Visit (INDEPENDENT_AMBULATORY_CARE_PROVIDER_SITE_OTHER): Payer: 59 | Admitting: Physician Assistant

## 2019-03-04 ENCOUNTER — Ambulatory Visit: Payer: 59 | Admitting: Physician Assistant

## 2019-03-04 VITALS — Ht 64.0 in | Wt 279.0 lb

## 2019-03-04 DIAGNOSIS — M9261 Juvenile osteochondrosis of tarsus, right ankle: Secondary | ICD-10-CM

## 2019-03-04 NOTE — Progress Notes (Signed)
Office Visit Note   Patient: Kerry Phillips           Date of Birth: 1955-05-19           MRN: [id number] Visit Date: 03/04/2019              Requested by: Steele Sizer, MD No address on file PCP: Steele Sizer, MD  Chief Complaint  Patient presents with  . Right Foot - Routine Post Op    01/04/19 take down Haglund's deformity right foot.       HPI: The patient presents today 2 months status post takedown of right Haglund's deformity overall she continues to improve.  She still cannot wear a closed backings shoe.  She also feels like she is developed some back pain but admits that she is walking with a limp she denies any weakness  Assessment & Plan: Visit Diagnoses: No diagnosis found.  Plan: I did offer her some physical therapy for gait training she has declined it at this time.  I also offered her a short course of steroids to help with her back symptoms again she has declined it and feels that regular anti-inflammatories have worked quite well follow-up in 4 weeks  Follow-Up Instructions: No follow-ups on file.   Ortho Exam  Patient is alert, oriented, no adenopathy, well-dressed, normal affect, normal respiratory effort Right lower extremity Achilles is intact she has good plantarflexion strength incision is healed there is just a very small eschar minimal to no swelling  Imaging: No results found. No images are attached to the encounter.  Labs: No results found for: HGBA1C, ESRSEDRATE, CRP, LABURIC, REPTSTATUS, GRAMSTAIN, CULT, LABORGA   Lab Results  Component Value Date   ALBUMIN 4.4 09/25/2018   ALBUMIN 4.2 09/07/2017   ALBUMIN 4.6 04/05/2016    No results found for: MG No results found for: VD25OH  No results found for: PREALBUMIN CBC EXTENDED Latest Ref Rng & Units 01/04/2019 09/25/2018 09/07/2017  WBC 4.0 - 10.5 K/uL 6.5 6.2 7.2  RBC 3.87 - 5.11 MIL/uL 4.43 4.31 4.10  HGB 12.0 - 15.0 g/dL 62.6 94.8 54.6  HCT 27.0 - 46.0 % 37.8 35.7 34.7  PLT  150 - 400 K/uL 298 322 315  NEUTROABS 1.4 - 7.0 x10E3/uL - 4.1 4.4  LYMPHSABS 0.7 - 3.1 x10E3/uL - 1.5 2.2     Body mass index is 47.89 kg/m.  Orders:  No orders of the defined types were placed in this encounter.  No orders of the defined types were placed in this encounter.    Procedures: No procedures performed  Clinical Data: No additional findings.  ROS:  All other systems negative, except as noted in the HPI. Review of Systems  Objective: Vital Signs: Ht 5\' 4"  (1.626 m)   Wt 279 lb (126.6 kg)   BMI 47.89 kg/m   Specialty Comments:  No specialty comments available.  PMFS History: Patient Active Problem List   Diagnosis Date Noted  . Haglund's deformity of right heel   . Morbid obesity (HCC) 03/22/2018  . Hypocalcemia 09/07/2017  . Dysphagia 01/17/2017  . BMI 50.0-59.9, adult (HCC) 04/05/2016  . Arthritis of both knees 04/05/2016  . Essential hypertension 12/22/2014  . Hypothyroidism 12/22/2014   Past Medical History:  Diagnosis Date  . Arthritis   . History of blood transfusion    with childbirth  . Hypothyroidism    surgical    Family History  Problem Relation Age of Onset  . Hypertension Mother   .  Cancer Father        Lung  . Stroke Sister   . Hypertension Sister   . Hypertension Daughter   . Hypertension Son   . Lupus Sister   . Breast cancer Sister     Past Surgical History:  Procedure Laterality Date  . ABDOMINAL HYSTERECTOMY    . ARTHROSCOPIC HAGLUNDS REPAIR Right 01/04/2019   Procedure: ENDOSCOPIC RESECTION HAGLUNDS DEFORMITY;  Surgeon: Newt Minion, MD;  Location: [facility];  Service: Orthopedics;  Laterality: Right;  . COLONOSCOPY    . THYROIDECTOMY     90% removed   Social History   Occupational History  . Not on file  Tobacco Use  . Smoking status: Never Smoker  . Smokeless tobacco: Never Used  Substance and Sexual Activity  . Alcohol use: No  . Drug use: Never  . Sexual activity: Yes

## 2019-03-22 MED FILL — LEVOTHYROXINE 88 MCG TABLET: 88 | 90 days supply | Qty: 90 | Fill #1

## 2019-03-22 MED FILL — OLMSRTN-AMLDPN-HCTZ 40-5-12: 40-5-12.5 | 90 days supply | Qty: 90 | Fill #0

## 2019-03-22 MED FILL — CALCITRIOL 0.5 MCG CAPS: 0.5 | 90 days supply | Qty: 180 | Fill #1

## 2019-03-25 ENCOUNTER — Other Ambulatory Visit: Payer: Self-pay | Admitting: Family Medicine

## 2019-03-25 DIAGNOSIS — Z1231 Encounter for screening mammogram for malignant neoplasm of breast: Secondary | ICD-10-CM

## 2019-03-28 ENCOUNTER — Ambulatory Visit: Payer: Self-pay | Admitting: Family Medicine

## 2019-04-01 ENCOUNTER — Ambulatory Visit (INDEPENDENT_AMBULATORY_CARE_PROVIDER_SITE_OTHER): Payer: 59 | Admitting: Physician Assistant

## 2019-04-01 ENCOUNTER — Other Ambulatory Visit: Payer: Self-pay

## 2019-04-01 ENCOUNTER — Encounter: Payer: Self-pay | Admitting: Physician Assistant

## 2019-04-01 VITALS — Ht 64.0 in | Wt 279.0 lb

## 2019-04-01 DIAGNOSIS — M9261 Juvenile osteochondrosis of tarsus, right ankle: Secondary | ICD-10-CM

## 2019-04-01 NOTE — Progress Notes (Signed)
Office Visit Note   Patient: Kerry Phillips           Date of Birth: 28-Feb-1956           MRN: [id number] Visit Date: 04/01/2019              Requested by: Guadalupe Maple, MD No address on file PCP: Guadalupe Maple, MD  Chief Complaint  Patient presents with  . Right Foot - Follow-up    01/04/19 take down Haglund's deformity right foot        HPI: This is a pleasant woman who is now almost 3 months status post takedown of a right deformity.  She has been working on a self-directed stretching program she still has some right buttock pain but she takes anti-inflammatories and again this seems to be improving she is actually able to wear shoe back of her heel  Assessment & Plan: Visit Diagnoses: No diagnosis found.  Plan: She will continue to work on her exercises follow-up for final visit in 1 month  Follow-Up Instructions: No follow-ups on file.   Ortho Exam  Patient is alert, oriented, no adenopathy, well-dressed, normal affect, normal respiratory effort. Focused examination demonstrates healed surgical incision.  Swelling is well controlled.  Compartments are soft and nontender.  Achilles is intact.  She easily flexes to past neutral without any difficulty  Imaging: No results found. No images are attached to the encounter.  Labs: No results found for: HGBA1C, ESRSEDRATE, CRP, LABURIC, REPTSTATUS, GRAMSTAIN, CULT, LABORGA   Lab Results  Component Value Date   ALBUMIN 4.4 09/25/2018   ALBUMIN 4.2 09/07/2017   ALBUMIN 4.6 04/05/2016    No results found for: MG No results found for: VD25OH  No results found for: PREALBUMIN CBC EXTENDED Latest Ref Rng & Units 01/04/2019 09/25/2018 09/07/2017  WBC 4.0 - 10.5 K/uL 6.5 6.2 7.2  RBC 3.87 - 5.11 MIL/uL 4.43 4.31 4.10  HGB 12.0 - 15.0 g/dL 12.3 12.0 11.4  HCT 36.0 - 46.0 % 37.8 35.7 34.7  PLT 150 - 400 K/uL 298 322 315  NEUTROABS 1.4 - 7.0 x10E3/uL - 4.1 4.4  LYMPHSABS 0.7 - 3.1 x10E3/uL - 1.5 2.2     Body  mass index is 47.89 kg/m.  Orders:  No orders of the defined types were placed in this encounter.  No orders of the defined types were placed in this encounter.    Procedures: No procedures performed  Clinical Data: No additional findings.  ROS:  All other systems negative, except as noted in the HPI. Review of Systems  Objective: Vital Signs: Ht 5\' 4"  (1.626 m)   Wt 279 lb (126.6 kg)   BMI 47.89 kg/m   Specialty Comments:  No specialty comments available.  PMFS History: Patient Active Problem List   Diagnosis Date Noted  . Haglund's deformity of right heel   . Morbid obesity ([place]) 03/22/2018  . Hypocalcemia 09/07/2017  . Dysphagia 01/17/2017  . BMI 50.0-59.9, adult ([place]) 04/05/2016  . Arthritis of both knees 04/05/2016  . Essential hypertension 12/22/2014  . Hypothyroidism 12/22/2014   Past Medical History:  Diagnosis Date  . Arthritis   . History of blood transfusion    with childbirth  . Hypothyroidism    surgical    Family History  Problem Relation Age of Onset  . Hypertension Mother   . Cancer Father        Lung  . Stroke Sister   . Hypertension Sister   .  Hypertension Daughter   . Hypertension Son   . Lupus Sister   . Breast cancer Sister     Past Surgical History:  Procedure Laterality Date  . ABDOMINAL HYSTERECTOMY    . ARTHROSCOPIC HAGLUNDS REPAIR Right 01/04/2019   Procedure: ENDOSCOPIC RESECTION HAGLUNDS DEFORMITY;  Surgeon: Nadara Mustard, MD;  Location: [facility];  Service: Orthopedics;  Laterality: Right;  . COLONOSCOPY    . THYROIDECTOMY     90% removed   Social History   Occupational History  . Not on file  Tobacco Use  . Smoking status: Never Smoker  . Smokeless tobacco: Never Used  Substance and Sexual Activity  . Alcohol use: No  . Drug use: Never  . Sexual activity: Yes

## 2019-04-29 ENCOUNTER — Ambulatory Visit: Payer: 59 | Admitting: Physician Assistant

## 2019-05-01 ENCOUNTER — Ambulatory Visit
Admission: RE | Admit: 2019-05-01 | Discharge: 2019-05-01 | Disposition: A | Discharge status: Home / Self Care | Payer: 59 | Source: Ambulatory Visit | Attending: *Deleted | Admitting: *Deleted

## 2019-05-01 ENCOUNTER — Other Ambulatory Visit: Payer: Self-pay

## 2019-05-01 ENCOUNTER — Other Ambulatory Visit: Payer: Self-pay | Admitting: Family Medicine

## 2019-05-01 DIAGNOSIS — Z1231 Encounter for screening mammogram for malignant neoplasm of breast: Secondary | ICD-10-CM

## 2019-05-23 ENCOUNTER — Ambulatory Visit: Payer: Self-pay | Admitting: Family Medicine

## 2019-11-22 ENCOUNTER — Other Ambulatory Visit: Payer: Self-pay | Admitting: Family Medicine

## 2019-11-22 DIAGNOSIS — I10 Essential (primary) hypertension: Secondary | ICD-10-CM

## 2019-11-22 DIAGNOSIS — E039 Hypothyroidism, unspecified: Secondary | ICD-10-CM

## 2019-11-22 MED ORDER — OLMESARTAN-AMLODIPINE-HCTZ 40-5-12.5 MG PO TABS: ORAL_TABLET | ORAL | 0 refills | Status: DC

## 2019-11-22 MED ORDER — LEVOTHYROXINE SODIUM 88 MCG PO TABS: 88.0000 ug | ORAL_TABLET | Freq: Every day | ORAL | 0 refills | Status: DC

## 2019-11-22 MED ORDER — CALCITRIOL 0.5 MCG PO CAPS: 0.5000 ug | ORAL_CAPSULE | Freq: Two times a day (BID) | ORAL | 0 refills | Status: DC

## 2019-11-22 NOTE — Telephone Encounter (Signed)
Called pt scheduled 10/11 with Shanda Bumps

## 2019-11-22 NOTE — Telephone Encounter (Signed)
Requested medication (s) are due for refill today: Yes  Requested medication (s) are on the active medication list: Yes  Last refill:  09/25/18  Future visit scheduled: No  Notes to clinic:  All prescriptions have expired.    Requested Prescriptions  Pending Prescriptions Disp Refills   levothyroxine (SYNTHROID) 88 MCG tablet 90 tablet 4    Sig: Take 1 tablet (88 mcg total) by mouth daily before breakfast.      Endocrinology:  Hypothyroid Agents Failed - 11/22/2019 11:16 AM      Failed - TSH needs to be rechecked within 3 months after an abnormal result. Refill until TSH is due.      Failed - TSH in normal range and within 360 days    TSH  Date Value Ref Range Status  11/23/2018 12.800 (H) 0.450 - 4.500 uIU/mL Final          Failed - Valid encounter within last 12 months    Recent Outpatient Visits           12 months ago Annual physical exam   [facility] [place], Corrie Dandy T, NP   1 year ago PE (physical exam), annual   [facility] [place], Redge Gainer, MD   1 year ago Essential hypertension   [facility] Criss[place], Redge Gainer, MD   2 years ago Essential hypertension   [facility] Criss[place], Redge Gainer, MD   2 years ago Essential hypertension   [facility] [place], Redge Gainer, MD                Olmesartan-amLODIPine-HCTZ 40-5-12.5 MG TABS 90 tablet 4    Sig: TAKE 1 TABLET BY MOUTH ONCE DAILY      Cardiovascular: CCB + ARB + Diuretic Combos Failed - 11/22/2019 11:16 AM      Failed - K in normal range and within 180 days    Potassium  Date Value Ref Range Status  01/04/2019 3.3 (L) 3.5 - 5.1 mmol/L Final          Failed - Na in normal range and within 180 days    Sodium  Date Value Ref Range Status  01/04/2019 139 135 - 145 mmol/L Final  09/25/2018 140 134 - 144 mmol/L Final          Failed - Cr in normal range and within 180 days    Creatinine, Ser  Date Value Ref Range Status  01/04/2019 0.94 0.44 -  1.00 mg/dL Final          Failed - Ca in normal range and within 180 days    Calcium  Date Value Ref Range Status  01/04/2019 8.2 (L) 8.9 - 10.3 mg/dL Final          Failed - Valid encounter within last 6 months    Recent Outpatient Visits           12 months ago Annual physical exam   [facility] [place], Corrie Dandy T, NP   1 year ago PE (physical exam), annual   [facility] Cr[place], Redge Gainer, MD   1 year ago Essential hypertension   [facility] Criss[place], Redge Gainer, MD   2 years ago Essential hypertension   [facility] [place], Redge Gainer, MD   2 years ago Essential hypertension   [facility] Criss[place], Redge Gainer, MD              Passed - Patient is not pregnant  Passed - Last BP in normal range    BP Readings from Last 1 Encounters:  01/04/19 131/85          Passed - Last Heart Rate in normal range    Pulse Readings from Last 1 Encounters:  01/04/19 89            calcitRIOL (ROCALTROL) 0.5 MCG capsule 180 capsule 4    Sig: Take 1 capsule (0.5 mcg total) by mouth 2 (two) times daily.      Endocrinology:  Vitamins - Vitamin D Supplementation Failed - 11/22/2019 11:16 AM      Failed - 50,000 IU strengths are not delegated      Failed - Ca in normal range and within 360 days    Calcium  Date Value Ref Range Status  01/04/2019 8.2 (L) 8.9 - 10.3 mg/dL Final          Failed - Phosphate in normal range and within 360 days    No results found for: PHOS        Failed - Vitamin D in normal range and within 360 days    No results found for: QJ1941DE0, CX4481EH6, [id number], 25OHVITD3, 25OHVITD2, 25OHVITD3, 25OHVITD2, 25OHVITD1, 25OHVITD2, 25OHVITD3, VD25OH        Failed - Valid encounter within last 12 months    Recent Outpatient Visits           12 months ago Annual physical exam   [facility] Marjie Skiff, NP   1 year ago PE (physical exam), annual   [facility]  [place], Redge Gainer, MD   1 year ago Essential hypertension   [facility] [place], Redge Gainer, MD   2 years ago Essential hypertension   [facility] Criss[place], Redge Gainer, MD   2 years ago Essential hypertension   [facility] [place], Redge Gainer, MD

## 2019-11-22 NOTE — Telephone Encounter (Signed)
Last seen 11/23/18 for CPE. Routing to provider for refill. Routing to admin as patient is overdue for appointment.

## 2019-11-22 NOTE — Telephone Encounter (Signed)
Medication: levothyroxine (SYNTHROID) 88 MCG tablet [[id number]] , Olmesartan-amLODIPine-HCTZ 40-5-12.5 MG TABS [[id number]] , calcitRIOL (ROCALTROL) 0.5 MCG capsule [[id number]] ,   Has the patient contacted their pharmacy? YES  (Agent: If no, request that the patient contact the pharmacy for the refill.) (Agent: If yes, when and what did the pharmacy advise?)  Preferred Pharmacy (with phone number or street name): [facility] [address] [place], [place]. [phone]   Agent: Please be advised that RX refills may take up to 3 business days. We ask that you follow-up with your pharmacy.

## 2019-12-07 NOTE — Progress Notes (Signed)
BP 122/82 (BP Location: Left Arm, Cuff Size: Large)   Pulse 88   Temp 98.1 F (36.7 C) (Oral)   Ht 5' 5.3" (1.659 m)   Wt 294 lb 9.6 oz (133.6 kg)   SpO2 98%   BMI 48.57 kg/m    Subjective:    Patient ID: Kerry Phillips, female    DOB: 1955/06/03, 64 y.o.   MRN: [id number]  HPI: Kerry Phillips is a 64 y.o. female presenting for medication refill.  Chief Complaint  Patient presents with  . Hypertension  . Hypothyroidism   HYPERTENSION Hypertension status: controlled  Satisfied with current treatment? yes Duration of hypertension: chronic BP monitoring frequency:  weekly BP range: 120/80 BP medication side effects:  no Medication compliance: excellent compliance Previous BP meds: olmesartan-amlodipine-HCTZ 40-5-12.5 Aspirin: no Recurrent headaches: no Visual changes: no Palpitations: no Dyspnea: no Chest pain: no Lower extremity edema: no Dizzy/lightheaded: no  HYPOTHYROIDISM Currently taking synthroid 88 mcg and calcitrol 0.5 mcg twice daily Thyroid control status:controlled Satisfied with current treatment? yes Medication side effects: no Medication compliance: excellent compliance Etiology of hypothyroidism:  Recent dose adjustment:no Fatigue: yes Cold intolerance: no Heat intolerance: no Weight gain: no Weight loss: no Constipation: no Diarrhea/loose stools: no Palpitations: no Lower extremity edema: no Anxiety/depressed mood: no  No Known Allergies  Outpatient Encounter Medications as of 12/09/2019  Medication Sig  . calcitRIOL (ROCALTROL) 0.5 MCG capsule Take 1 capsule (0.5 mcg total) by mouth 2 (two) times daily.  . Iron-Vitamins (GERITOL TONIC PO) Take 5 mLs by mouth daily.  Marland Kitchen levothyroxine (SYNTHROID) 88 MCG tablet Take 1 tablet (88 mcg total) by mouth daily before breakfast.  . Olmesartan-amLODIPine-HCTZ 40-5-12.5 MG TABS TAKE 1 TABLET BY MOUTH ONCE DAILY  . [DISCONTINUED] calcitRIOL (ROCALTROL) 0.5 MCG capsule Take 1 capsule (0.5 mcg total)  by mouth 2 (two) times daily.  . [DISCONTINUED] levothyroxine (SYNTHROID) 88 MCG tablet Take 1 tablet (88 mcg total) by mouth daily before breakfast.  . [DISCONTINUED] Olmesartan-amLODIPine-HCTZ 40-5-12.5 MG TABS TAKE 1 TABLET BY MOUTH ONCE DAILY  . [DISCONTINUED] HYDROcodone-acetaminophen (NORCO) 5-325 MG tablet Take 1 tablet by mouth every 6 (six) hours as needed for moderate pain.  . [DISCONTINUED] ibuprofen (ADVIL) 400 MG tablet Take 400 mg by mouth every 6 (six) hours as needed.   No facility-administered encounter medications on file as of 12/09/2019.   Patient Active Problem List   Diagnosis Date Noted  . Haglund's deformity of right heel   . Morbid obesity (HCC) 03/22/2018  . Hypocalcemia 09/07/2017  . Dysphagia 01/17/2017  . BMI 50.0-59.9, adult (HCC) 04/05/2016  . Arthritis of both knees 04/05/2016  . Essential hypertension 12/22/2014  . Hypothyroidism 12/22/2014   Past Medical History:  Diagnosis Date  . Arthritis   . History of blood transfusion    with childbirth  . Hypothyroidism    surgical   Relevant past medical, surgical, family and social history reviewed and updated as indicated. Interim medical history since our last visit reviewed.  Review of Systems  Constitutional: Negative.  Negative for activity change, appetite change, fatigue and fever.  Eyes: Negative.  Negative for visual disturbance.  Respiratory: Negative.  Negative for cough, shortness of breath and wheezing.   Cardiovascular: Negative.  Negative for chest pain, palpitations and leg swelling.  Gastrointestinal: Negative.  Negative for nausea and vomiting.  Musculoskeletal: Negative.   Skin: Negative.  Negative for rash.  Neurological: Negative.  Negative for dizziness, weakness, light-headedness and headaches.  Psychiatric/Behavioral: Negative.  Negative for  confusion, decreased concentration and sleep disturbance.    Per HPI unless specifically indicated above     Objective:    BP 122/82  (BP Location: Left Arm, Cuff Size: Large)   Pulse 88   Temp 98.1 F (36.7 C) (Oral)   Ht 5' 5.3" (1.659 m)   Wt 294 lb 9.6 oz (133.6 kg)   SpO2 98%   BMI 48.57 kg/m   Wt Readings from Last 3 Encounters:  12/09/19 294 lb 9.6 oz (133.6 kg)  04/01/19 279 lb (126.6 kg)  03/04/19 279 lb (126.6 kg)    Physical Exam Vitals and nursing note reviewed.  Constitutional:      General: She is not in acute distress.    Appearance: Normal appearance. She is obese. She is not toxic-appearing.  HENT:     Head: Normocephalic and atraumatic.     Right Ear: External ear normal.     Left Ear: External ear normal.  Eyes:     General: No scleral icterus.    Extraocular Movements: Extraocular movements intact.  Neck:     Vascular: No carotid bruit.  Cardiovascular:     Rate and Rhythm: Normal rate and regular rhythm.     Heart sounds: Normal heart sounds. No murmur heard.   Pulmonary:     Effort: Pulmonary effort is normal. No respiratory distress.     Breath sounds: Normal breath sounds. No wheezing, rhonchi or rales.  Abdominal:     General: Abdomen is flat. Bowel sounds are normal.     Palpations: Abdomen is soft.     Tenderness: There is no abdominal tenderness.  Musculoskeletal:        General: No swelling or tenderness. Normal range of motion.     Cervical back: Normal range of motion.  Skin:    General: Skin is warm and dry.     Capillary Refill: Capillary refill takes less than 2 seconds.     Coloration: Skin is not jaundiced.     Findings: No bruising.  Neurological:     General: No focal deficit present.     Mental Status: She is alert and oriented to person, place, and time.     Motor: No weakness.     Gait: Gait normal.  Psychiatric:        Mood and Affect: Mood normal.        Behavior: Behavior normal.        Thought Content: Thought content normal.        Judgment: Judgment normal.        Assessment & Plan:   Problem List Items Addressed This Visit       Cardiovascular and Mediastinum   Essential hypertension - Primary    Chronic, stable.  BP at goal today in office.  We will continue combination pill of olmesartan, amlodipine, hydrochlorothiazide.  CMP, CBC checked today.  DASH diet given.  Follow-up in 6 months.      Relevant Medications   Olmesartan-amLODIPine-HCTZ 40-5-12.5 MG TABS   Other Relevant Orders   TSH   CBC with Differential/Platelet   Comprehensive metabolic panel     Endocrine   Hypothyroidism    Chronic, ongoing.  TSH checked today.  We will continue levothyroxine 88 mcg daily for now.  Also continue Calcitrol 0.5 mcg twice daily.  Return in 6 months for ongoing monitoring.      Relevant Medications   levothyroxine (SYNTHROID) 88 MCG tablet   Other Relevant Orders   TSH  Comprehensive metabolic panel     Other   Hypocalcemia   Relevant Medications   calcitRIOL (ROCALTROL) 0.5 MCG capsule    Other Visit Diagnoses    Encounter for lipid screening for cardiovascular disease       Relevant Orders   Lipid Panel w/o Chol/HDL Ratio       Follow up plan: Return in about 1 week (around 12/16/2019) for CPE .

## 2019-12-09 ENCOUNTER — Other Ambulatory Visit: Payer: Self-pay

## 2019-12-09 ENCOUNTER — Encounter: Payer: Self-pay | Admitting: Nurse Practitioner

## 2019-12-09 ENCOUNTER — Ambulatory Visit (INDEPENDENT_AMBULATORY_CARE_PROVIDER_SITE_OTHER): Payer: 59 | Admitting: Nurse Practitioner

## 2019-12-09 VITALS — BP 122/82 | HR 88 | Temp 98.1°F | Ht 65.3 in | Wt 294.6 lb

## 2019-12-09 DIAGNOSIS — Z136 Encounter for screening for cardiovascular disorders: Secondary | ICD-10-CM | POA: Diagnosis not present

## 2019-12-09 DIAGNOSIS — E039 Hypothyroidism, unspecified: Secondary | ICD-10-CM

## 2019-12-09 DIAGNOSIS — Z13220 Encounter for screening for lipoid disorders: Secondary | ICD-10-CM | POA: Diagnosis not present

## 2019-12-09 DIAGNOSIS — I10 Essential (primary) hypertension: Secondary | ICD-10-CM | POA: Diagnosis not present

## 2019-12-09 MED ORDER — CALCITRIOL 0.5 MCG PO CAPS: 0.5000 ug | ORAL_CAPSULE | Freq: Two times a day (BID) | ORAL | 1 refills | Status: DC

## 2019-12-09 MED ORDER — OLMESARTAN-AMLODIPINE-HCTZ 40-5-12.5 MG PO TABS: ORAL_TABLET | ORAL | 1 refills | Status: DC

## 2019-12-09 MED ORDER — LEVOTHYROXINE SODIUM 88 MCG PO TABS: 88.0000 ug | ORAL_TABLET | Freq: Every day | ORAL | 1 refills | Status: DC

## 2019-12-09 NOTE — Patient Instructions (Signed)
DASH Eating Plan DASH stands for "Dietary Approaches to Stop Hypertension." The DASH eating plan is a healthy eating plan that has been shown to reduce high blood pressure (hypertension). It may also reduce your risk for type 2 diabetes, heart disease, and stroke. The DASH eating plan may also help with weight loss. What are tips for following this plan?  General guidelines  Avoid eating more than 2,300 mg (milligrams) of salt (sodium) a day. If you have hypertension, you may need to reduce your sodium intake to 1,500 mg a day.  Limit alcohol intake to no more than 1 drink a day for nonpregnant women and 2 drinks a day for men. One drink equals 12 oz of beer, 5 oz of wine, or 1 oz of hard liquor.  Work with your health care provider to maintain a healthy body weight or to lose weight. Ask what an ideal weight is for you.  Get at least 30 minutes of exercise that causes your heart to beat faster (aerobic exercise) most days of the week. Activities may include walking, swimming, or biking.  Work with your health care provider or diet and nutrition specialist (dietitian) to adjust your eating plan to your individual calorie needs. Reading food labels   Check food labels for the amount of sodium per serving. Choose foods with less than 5 percent of the Daily Value of sodium. Generally, foods with less than 300 mg of sodium per serving fit into this eating plan.  To find whole grains, look for the word "whole" as the first word in the ingredient list. Shopping  Buy products labeled as "low-sodium" or "no salt added."  Buy fresh foods. Avoid canned foods and premade or frozen meals. Cooking  Avoid adding salt when cooking. Use salt-free seasonings or herbs instead of table salt or sea salt. Check with your health care provider or pharmacist before using salt substitutes.  Do not fry foods. Cook foods using healthy methods such as baking, boiling, grilling, and broiling instead.  Cook with  heart-healthy oils, such as olive, canola, soybean, or sunflower oil. Meal planning  Eat a balanced diet that includes: ? 5 or more servings of fruits and vegetables each day. At each meal, try to fill half of your plate with fruits and vegetables. ? Up to 6-8 servings of whole grains each day. ? Less than 6 oz of lean meat, poultry, or fish each day. A 3-oz serving of meat is about the same size as a deck of cards. One egg equals 1 oz. ? 2 servings of low-fat dairy each day. ? A serving of nuts, seeds, or beans 5 times each week. ? Heart-healthy fats. Healthy fats called Omega-3 fatty acids are found in foods such as flaxseeds and coldwater fish, like sardines, salmon, and mackerel.  Limit how much you eat of the following: ? Canned or prepackaged foods. ? Food that is high in trans fat, such as fried foods. ? Food that is high in saturated fat, such as fatty meat. ? Sweets, desserts, sugary drinks, and other foods with added sugar. ? Full-fat dairy products.  Do not salt foods before eating.  Try to eat at least 2 vegetarian meals each week.  Eat more home-cooked food and less restaurant, buffet, and fast food.  When eating at a restaurant, ask that your food be prepared with less salt or no salt, if possible. What foods are recommended? The items listed may not be a complete list. Talk with your dietitian about   what dietary choices are best for you. Grains Whole-grain or whole-wheat bread. Whole-grain or whole-wheat pasta. Brown rice. Oatmeal. Quinoa. Bulgur. Whole-grain and low-sodium cereals. Pita bread. Low-fat, low-sodium crackers. Whole-wheat flour tortillas. Vegetables Fresh or frozen vegetables (raw, steamed, roasted, or grilled). Low-sodium or reduced-sodium tomato and vegetable juice. Low-sodium or reduced-sodium tomato sauce and tomato paste. Low-sodium or reduced-sodium canned vegetables. Fruits All fresh, dried, or frozen fruit. Canned fruit in natural juice (without  added sugar). Meat and other protein foods Skinless chicken or turkey. Ground chicken or turkey. Pork with fat trimmed off. Fish and seafood. Egg whites. Dried beans, peas, or lentils. Unsalted nuts, nut butters, and seeds. Unsalted canned beans. Lean cuts of beef with fat trimmed off. Low-sodium, lean deli meat. Dairy Low-fat (1%) or fat-free (skim) milk. Fat-free, low-fat, or reduced-fat cheeses. Nonfat, low-sodium ricotta or cottage cheese. Low-fat or nonfat yogurt. Low-fat, low-sodium cheese. Fats and oils Soft margarine without trans fats. Vegetable oil. Low-fat, reduced-fat, or light mayonnaise and salad dressings (reduced-sodium). Canola, safflower, olive, soybean, and sunflower oils. Avocado. Seasoning and other foods Herbs. Spices. Seasoning mixes without salt. Unsalted popcorn and pretzels. Fat-free sweets. What foods are not recommended? The items listed may not be a complete list. Talk with your dietitian about what dietary choices are best for you. Grains Baked goods made with fat, such as croissants, muffins, or some breads. Dry pasta or rice meal packs. Vegetables Creamed or fried vegetables. Vegetables in a cheese sauce. Regular canned vegetables (not low-sodium or reduced-sodium). Regular canned tomato sauce and paste (not low-sodium or reduced-sodium). Regular tomato and vegetable juice (not low-sodium or reduced-sodium). Pickles. Olives. Fruits Canned fruit in a light or heavy syrup. Fried fruit. Fruit in cream or butter sauce. Meat and other protein foods Fatty cuts of meat. Ribs. Fried meat. Bacon. Sausage. Bologna and other processed lunch meats. Salami. Fatback. Hotdogs. Bratwurst. Salted nuts and seeds. Canned beans with added salt. Canned or smoked fish. Whole eggs or egg yolks. Chicken or turkey with skin. Dairy Whole or 2% milk, cream, and half-and-half. Whole or full-fat cream cheese. Whole-fat or sweetened yogurt. Full-fat cheese. Nondairy creamers. Whipped toppings.  Processed cheese and cheese spreads. Fats and oils Butter. Stick margarine. Lard. Shortening. Ghee. Bacon fat. Tropical oils, such as coconut, palm kernel, or palm oil. Seasoning and other foods Salted popcorn and pretzels. Onion salt, garlic salt, seasoned salt, table salt, and sea salt. Worcestershire sauce. Tartar sauce. Barbecue sauce. Teriyaki sauce. Soy sauce, including reduced-sodium. Steak sauce. Canned and packaged gravies. Fish sauce. Oyster sauce. Cocktail sauce. Horseradish that you find on the shelf. Ketchup. Mustard. Meat flavorings and tenderizers. Bouillon cubes. Hot sauce and Tabasco sauce. Premade or packaged marinades. Premade or packaged taco seasonings. Relishes. Regular salad dressings. Where to find more information:  National Heart, Lung, and Blood Institute: www.nhlbi.nih.gov  American Heart Association: www.heart.org Summary  The DASH eating plan is a healthy eating plan that has been shown to reduce high blood pressure (hypertension). It may also reduce your risk for type 2 diabetes, heart disease, and stroke.  With the DASH eating plan, you should limit salt (sodium) intake to 2,300 mg a day. If you have hypertension, you may need to reduce your sodium intake to 1,500 mg a day.  When on the DASH eating plan, aim to eat more fresh fruits and vegetables, whole grains, lean proteins, low-fat dairy, and heart-healthy fats.  Work with your health care provider or diet and nutrition specialist (dietitian) to adjust your eating plan to your   individual calorie needs. This information is not intended to replace advice given to you by your health care provider. Make sure you discuss any questions you have with your health care provider. Document Revised: 01/27/2017 Document Reviewed: 02/08/2016 Elsevier Patient Education  2020 [employer] Inc.  

## 2019-12-09 NOTE — Assessment & Plan Note (Signed)
Chronic, stable.  BP at goal today in office.  We will continue combination pill of olmesartan, amlodipine, hydrochlorothiazide.  CMP, CBC checked today.  DASH diet given.  Follow-up in 6 months.

## 2019-12-09 NOTE — Assessment & Plan Note (Addendum)
Chronic, ongoing.  TSH checked today.  We will continue levothyroxine 88 mcg daily for now.  Also continue Calcitrol 0.5 mcg twice daily.  Return in 6 months for ongoing monitoring.

## 2019-12-10 ENCOUNTER — Telehealth: Payer: Self-pay

## 2019-12-10 LAB — CBC WITH DIFFERENTIAL/PLATELET
Basophils Absolute: 0 10*3/uL (ref 0.0–0.2)
Basos: 1 %
EOS (ABSOLUTE): 0.1 10*3/uL (ref 0.0–0.4)
Eos: 2 %
Hematocrit: 38.4 % (ref 34.0–46.6)
Hemoglobin: 12.5 g/dL (ref 11.1–15.9)
Immature Grans (Abs): 0 10*3/uL (ref 0.0–0.1)
Immature Granulocytes: 0 %
Lymphocytes Absolute: 1.2 10*3/uL (ref 0.7–3.1)
Lymphs: 19 %
MCH: 27.9 pg (ref 26.6–33.0)
MCHC: 32.6 g/dL (ref 31.5–35.7)
MCV: 86 fL (ref 79–97)
Monocytes Absolute: 0.4 10*3/uL (ref 0.1–0.9)
Monocytes: 6 %
Neutrophils Absolute: 4.5 10*3/uL (ref 1.4–7.0)
Neutrophils: 72 %
Platelets: 338 10*3/uL (ref 150–450)
RBC: 4.48 x10E6/uL (ref 3.77–5.28)
RDW: 14.5 % (ref 11.7–15.4)
WBC: 6.3 10*3/uL (ref 3.4–10.8)

## 2019-12-10 LAB — COMPREHENSIVE METABOLIC PANEL
ALT: 6 IU/L (ref 0–32)
AST: 13 IU/L (ref 0–40)
Albumin/Globulin Ratio: 1.8 (ref 1.2–2.2)
Albumin: 4.4 g/dL (ref 3.8–4.8)
Alkaline Phosphatase: 60 IU/L (ref 44–121)
BUN/Creatinine Ratio: 15 (ref 12–28)
BUN: 15 mg/dL (ref 8–27)
Bilirubin Total: 0.4 mg/dL (ref 0.0–1.2)
CO2: 27 mmol/L (ref 20–29)
Calcium: 8.3 mg/dL — ABNORMAL LOW (ref 8.7–10.3)
Chloride: 99 mmol/L (ref 96–106)
Creatinine, Ser: 0.99 mg/dL (ref 0.57–1.00)
GFR calc Af Amer: 69 mL/min/{1.73_m2} (ref >59)
GFR calc non Af Amer: 60 mL/min/{1.73_m2} (ref >59)
Globulin, Total: 2.4 g/dL (ref 1.5–4.5)
Glucose: 87 mg/dL (ref 65–99)
Potassium: 3.5 mmol/L (ref 3.5–5.2)
Sodium: 140 mmol/L (ref 134–144)
Total Protein: 6.8 g/dL (ref 6.0–8.5)

## 2019-12-10 LAB — LIPID PANEL W/O CHOL/HDL RATIO
Cholesterol, Total: 235 mg/dL — ABNORMAL HIGH (ref 100–199)
HDL: 55 mg/dL (ref >39)
LDL Chol Calc (NIH): 164 mg/dL — ABNORMAL HIGH (ref 0–99)
Triglycerides: 93 mg/dL (ref 0–149)
VLDL Cholesterol Cal: 16 mg/dL (ref 5–40)

## 2019-12-10 LAB — TSH: TSH: 14.7 u[IU]/mL — ABNORMAL HIGH (ref 0.450–4.500)

## 2019-12-10 NOTE — Addendum Note (Signed)
Addended by: Cathlean Marseilles A on: 12/10/2019 11:47 AM   Modules accepted: Orders

## 2019-12-10 NOTE — Telephone Encounter (Signed)
Patient advised as below. Patient verbalizes understanding and is in agreement with treatment plan.   Please let patient know that lab results are back from yesterday. -Her cholesterol levels were slightly elevated. I am most worried about her LDL cholesterol which is the bad cholesterol. We really like for this level to be less than 100. I would recommend eating a diet low in saturated fats (which are found in animal proteins, and dairy products). Also, increasing physical activity can sometimes help with his cholesterol level. No medication is recommended at this time however if these levels continue to increase, we will recommend a statin. -Her TSH was elevated. I have ordered a thyroid panel to check her T3 and T4. We will see what her T4 looks like before adjusting her medication. -All of her blood counts were nice and normal. -All of her electrolytes, kidney function, and liver function look stable from previous checks. We will continue on the Calcitrol for now.

## 2019-12-11 LAB — THYROID PANEL WITH TSH
Free Thyroxine Index: 1.4 (ref 1.2–4.9)
T3 Uptake Ratio: 25 % (ref 24–39)
T4, Total: 5.4 ug/dL (ref 4.5–12.0)
TSH: 16.1 u[IU]/mL — ABNORMAL HIGH (ref 0.450–4.500)

## 2019-12-11 LAB — SPECIMEN STATUS REPORT

## 2019-12-26 ENCOUNTER — Encounter: Payer: PRIVATE HEALTH INSURANCE | Admitting: Nurse Practitioner

## 2019-12-31 ENCOUNTER — Encounter: Payer: PRIVATE HEALTH INSURANCE | Admitting: Nurse Practitioner

## 2020-01-01 ENCOUNTER — Other Ambulatory Visit: Payer: Self-pay

## 2020-01-01 ENCOUNTER — Encounter: Payer: Self-pay | Admitting: Family Medicine

## 2020-01-01 ENCOUNTER — Ambulatory Visit (INDEPENDENT_AMBULATORY_CARE_PROVIDER_SITE_OTHER): Payer: Medicare Other | Admitting: Family Medicine

## 2020-01-01 VITALS — BP 129/84 | HR 89 | Temp 98.6°F | Ht 65.3 in | Wt 292.8 lb

## 2020-01-01 DIAGNOSIS — I10 Essential (primary) hypertension: Secondary | ICD-10-CM | POA: Diagnosis not present

## 2020-01-01 DIAGNOSIS — Z780 Asymptomatic menopausal state: Secondary | ICD-10-CM

## 2020-01-01 LAB — BAYER DCA HB A1C WAIVED: HB A1C (BAYER DCA - WAIVED): 4.8 % (ref <7.0)

## 2020-01-01 NOTE — Assessment & Plan Note (Signed)
Well controlled on current regimen. Has recent lab work. No changes today. F/u in 3 months.

## 2020-01-01 NOTE — Assessment & Plan Note (Signed)
Recent labs with mild hypocalcemia. Taking calcium supplement and calcitriol. Will update DEXA.

## 2020-01-01 NOTE — Patient Instructions (Signed)
It was great to see you!  Our plans for today:  - We are updating your bone density scan. [facility] at [phone] if you don't hear about an appointment in the next few days. - Take a vitamin D supplement (600 to 800 IU) to help boost your calcium levels and prevent osteoporosis. - We are screening for diabetes today, we will let you know these results. - Come back in 3 months for your next check up.  Take care and seek immediate care sooner if you develop any concerns.   Dr. Linwood Dibbles  Here is an example of what a healthy plate looks like:    ? Make half your plate fruits and vegetables.     ? Focus on whole fruits.     ? Vary your veggies.  ? Make half your grains whole grains. -     ? Look for the word "whole" at the beginning of the ingredients list    ? Some whole-grain ingredients include whole oats, whole-wheat flour,        whole-grain corn, whole-grain brown rice, and whole rye.  ? Move to low-fat and fat-free milk or yogurt.  ? Vary your protein routine. - Meat, fish, poultry (chicken, [place]), eggs, beans (kidney, pinto), dairy.  ? Drink and eat less sodium, saturated fat, and added sugars.

## 2020-01-01 NOTE — Assessment & Plan Note (Addendum)
Recommend focusing on lifestyle modifications as she is doing. Continue increasing physical activity as foot tolerates. Handout provided on diet recommendations. Will screen for diabetes given obesity, HTN, elevated cholesterol and +FH.

## 2020-01-01 NOTE — Progress Notes (Signed)
Subjective:  CC -- Annual Physical; No acute concerns.  General Healthcare: Medication Compliance: yes  Dx Hypertension: yes  Dx Hyperlipidemia: yes, diet controlled  Diabetes: no. Has FH of diabetes (grandson)  Dx Obesity: yes, class 3  Weight Loss: no Physical Activity: walks on treadmill, just started back after foot surgery Diet: 1-2 larger meals per day with snacks. Likes fruit. Urinary Incontinence: no   Social: Lives with husband.  Driving: yes Alcohol Use: no  Tobacco Use: no  Other Drugs: no  Independence: yes Depression: no   - PHQ2 score: 0 Support and Life at Home: yes  Advanced Directives: yes, will bring copy   Cancer:  Colorectal >> Colonoscopy: UTD, due 2024  Lung >> Tobacco Use: no Breast >> Mammogram: UTD, due 2023 Cervical/Endometrial >>  - Postmenopausal: yes - Hysterectomy: yes with cervix removed, ovaries remain - Vaginal Bleeding: no - Pap Smear: n/a  Skin >> Suspicious lesions: no   Other: Osteoporosis: no, normal DEXA 2015 Shingrix Vaccine: no, declined Flu Vaccine: no  COVID Vaccine: no Pneumonia Vaccine: no, declined  Td: no, declined Hep C screening: UTD HIV screening: UTD  ROS- No CP, SOB, difficulties eating, blood in urine or stool, dysuria, abd pain. Occasional stuffiness with allergies, controlled with OTC meds.  Past Medical History Patient Active Problem List   Diagnosis Date Noted  . Haglund's deformity of right heel   . Morbid obesity (HCC) 03/22/2018  . Hypocalcemia 09/07/2017  . BMI 50.0-59.9, adult (HCC) 04/05/2016  . Arthritis of both knees 04/05/2016  . Essential hypertension 12/22/2014  . Hypothyroidism 12/22/2014    Medications- reviewed and updated Current Outpatient Medications  Medication Sig Dispense Refill  . calcitRIOL (ROCALTROL) 0.5 MCG capsule Take 1 capsule (0.5 mcg total) by mouth 2 (two) times daily. 180 capsule 1  . Iron-Vitamins (GERITOL TONIC PO) Take 5 mLs by mouth daily.    Marland Kitchen levothyroxine  (SYNTHROID) 88 MCG tablet Take 1 tablet (88 mcg total) by mouth daily before breakfast. 90 tablet 1  . Olmesartan-amLODIPine-HCTZ 40-5-12.5 MG TABS TAKE 1 TABLET BY MOUTH ONCE DAILY 90 tablet 1   No current facility-administered medications for this visit.    Objective: BP 129/84 (BP Location: Left Arm, Cuff Size: Large)   Pulse 89   Temp 98.6 F (37 C) (Oral)   Ht 5' 5.3" (1.659 m)   Wt 292 lb 12.8 oz (132.8 kg)   SpO2 99%   BMI 48.28 kg/m  Gen: NAD, alert, cooperative with exam HEENT: NCAT, EOMI CV: RRR, good S1/S2, no murmur Resp: CTABL, no wheezes, non-labored Abd: Soft, Non Tender, Non Distended, BS present, no guarding or organomegaly Genital Exam: not indicated Ext: No edema, warm Neuro: Alert and oriented, No gross deficits   Assessment/Plan:  Essential hypertension Well controlled on current regimen. Has recent lab work. No changes today. F/u in 3 months.  Hypocalcemia Recent labs with mild hypocalcemia. Taking calcium supplement and calcitriol. Will update DEXA.   Morbid obesity (HCC) Recommend focusing on lifestyle modifications as she is doing. Continue increasing physical activity as foot tolerates. Handout provided on diet recommendations. Will screen for diabetes given obesity, HTN, elevated cholesterol and +FH.   Orders Placed This Encounter  Procedures  . DG Bone Density    Standing Status:   Future    Standing Expiration Date:   12/31/2020    Order Specific Question:   Reason for Exam (SYMPTOM  OR DIAGNOSIS REQUIRED)    Answer:   postmenopausal    Order Specific  Question:   Preferred imaging location?    Answer:   [facility]  . Bayer DCA Hb A1c Waived (STAT)    No orders of the defined types were placed in this encounter.

## 2020-02-18 ENCOUNTER — Other Ambulatory Visit: Payer: Self-pay | Admitting: Family Medicine

## 2020-02-18 DIAGNOSIS — Z780 Asymptomatic menopausal state: Secondary | ICD-10-CM

## 2020-04-08 ENCOUNTER — Ambulatory Visit: Payer: 59 | Admitting: Nurse Practitioner

## 2020-04-16 ENCOUNTER — Other Ambulatory Visit: Payer: Self-pay

## 2020-04-16 ENCOUNTER — Encounter: Payer: Self-pay | Admitting: Nurse Practitioner

## 2020-04-16 ENCOUNTER — Ambulatory Visit (INDEPENDENT_AMBULATORY_CARE_PROVIDER_SITE_OTHER): Payer: Medicare Other | Admitting: Nurse Practitioner

## 2020-04-16 DIAGNOSIS — I10 Essential (primary) hypertension: Secondary | ICD-10-CM | POA: Diagnosis not present

## 2020-04-16 DIAGNOSIS — Z6841 Body Mass Index (BMI) 40.0 and over, adult: Secondary | ICD-10-CM | POA: Diagnosis not present

## 2020-04-16 DIAGNOSIS — Z0000 Encounter for general adult medical examination without abnormal findings: Secondary | ICD-10-CM

## 2020-04-16 DIAGNOSIS — E039 Hypothyroidism, unspecified: Secondary | ICD-10-CM | POA: Diagnosis not present

## 2020-04-16 MED ORDER — LEVOTHYROXINE SODIUM 88 MCG PO TABS: 88.0000 ug | ORAL_TABLET | Freq: Every day | ORAL | 4 refills | Status: DC

## 2020-04-16 MED ORDER — OLMESARTAN-AMLODIPINE-HCTZ 40-5-12.5 MG PO TABS
ORAL_TABLET | ORAL | 4 refills | Status: DC
Start: 2020-04-16 — End: 2021-06-09

## 2020-04-16 NOTE — Assessment & Plan Note (Signed)
Chronic, stable with BP at goal.  Recommend she monitor BP at least a few mornings a week at home and document.  DASH diet at home.  Continue current medication regimen and adjust as needed.  Labs today: CMP and CBC.  Refills sent.  Return in 6 months.

## 2020-04-16 NOTE — Assessment & Plan Note (Signed)
Chronic, ongoing.  Continue current medication regimen and adjust as needed.  TSH and Free T4 today.  Return in 6 months.

## 2020-04-16 NOTE — Assessment & Plan Note (Signed)
Refer to Morbid Obesity plan of care. 

## 2020-04-16 NOTE — Progress Notes (Signed)
BP 111/74   Pulse 91   Temp 98.4 F (36.9 C) (Oral)   Ht 5' 4.17" (1.63 m)   Wt 288 lb (130.6 kg)   SpO2 97%   BMI 49.17 kg/m    Subjective:    Patient ID: Kerry Phillips, female    DOB: 08/10/1955, 65 y.o.   MRN: [id number]  HPI: Kerry Phillips is a 65 y.o. female presenting on 04/16/2020 for comprehensive medical examination. Current medical complaints include:none  She currently lives with: self Menopausal Symptoms: no   HYPERTENSION Taking Olmesartan-Amlodipine- HCTZ daily.  Calcium levels in 8 range, continues on supplements. Hypertension status: controlled  Satisfied with current treatment? yes Duration of hypertension: chronic BP monitoring frequency:  not checking BP range:  BP medication side effects:  no Medication compliance: good compliance Aspirin: no Recurrent headaches: no Visual changes: no Palpitations: no Dyspnea: no Chest pain: no Lower extremity edema: no Dizzy/lightheaded: no   HYPOTHYROIDISM Taking Levothyroxine 88 MCG daily. Thyroid control status:stable Satisfied with current treatment? yes Medication side effects: no Medication compliance: good compliance Etiology of hypothyroidism:  Recent dose adjustment:no Fatigue: no Cold intolerance: no Heat intolerance: no Weight gain: no Weight loss: no Constipation: no Diarrhea/loose stools: no Palpitations: no Lower extremity edema: no Anxiety/depressed mood: no  Depression Screen done today and results listed below:  Depression screen [facility] 2/9 04/16/2020 01/01/2020 12/09/2019 11/23/2018 10/03/2017  Decreased Interest 0 0 0 0 0  Down, Depressed, Hopeless 1 0 0 0 0  PHQ - 2 Score 1 0 0 0 0  Altered sleeping 0 - - 0 -  Tired, decreased energy 0 - - 0 -  Change in appetite 0 - - 0 -  Feeling bad or failure about yourself  0 - - 0 -  Trouble concentrating 0 - - 0 -  Moving slowly or fidgety/restless 0 - - 0 -  Suicidal thoughts 0 - - 0 -  PHQ-9 Score 1 - - 0 -  Difficult doing work/chores -  - - Not difficult at all -    The patient does not have a history of falls. I did not complete a risk assessment for falls. A plan of care for falls was not documented.   Past Medical History:  Past Medical History:  Diagnosis Date  . Arthritis   . Dysphagia 01/17/2017  . History of blood transfusion    with childbirth  . Hypothyroidism    surgical    Surgical History:  Past Surgical History:  Procedure Laterality Date  . ABDOMINAL HYSTERECTOMY    . ARTHROSCOPIC HAGLUNDS REPAIR Right 01/04/2019   Procedure: ENDOSCOPIC RESECTION HAGLUNDS DEFORMITY;  Surgeon: Nadara Mustard, MD;  Location: [facility] OR;  Service: Orthopedics;  Laterality: Right;  . COLONOSCOPY    . THYROIDECTOMY     90% removed    Medications:  Current Outpatient Medications on File Prior to Visit  Medication Sig  . calcitRIOL (ROCALTROL) 0.5 MCG capsule Take 1 capsule (0.5 mcg total) by mouth 2 (two) times daily.  . Iron-Vitamins (GERITOL TONIC PO) Take 5 mLs by mouth daily.   No current facility-administered medications on file prior to visit.    Allergies:  No Known Allergies  Social History:  Social History   Socioeconomic History  . Marital status: Married    Spouse name: Not on file  . Number of children: Not on file  . Years of education: Not on file  . Highest education level: Not on file  Occupational History  .  Not on file  Tobacco Use  . Smoking status: Never Smoker  . Smokeless tobacco: Never Used  Vaping Use  . Vaping Use: Never used  Substance and Sexual Activity  . Alcohol use: No  . Drug use: Never  . Sexual activity: Yes  Other Topics Concern  . Not on file  Social History Narrative  . Not on file   Social Determinants of Health   Financial Resource Strain: Not on file  Food Insecurity: Not on file  Transportation Needs: Not on file  Physical Activity: Not on file  Stress: Not on file  Social Connections: Not on file  Intimate Partner Violence: Not on file   Social  History   Tobacco Use  Smoking Status Never Smoker  Smokeless Tobacco Never Used   Social History   Substance and Sexual Activity  Alcohol Use No    Family History:  Family History  Problem Relation Age of Onset  . Hypertension Mother   . Cancer Father        Lung  . Stroke Sister   . Hypertension Sister   . Hypertension Daughter   . Hypertension Son   . Lupus Sister   . Breast cancer Sister     Past medical history, surgical history, medications, allergies, family history and social history reviewed with patient today and changes made to appropriate areas of the chart.   ROS All other ROS negative except what is listed above and in the HPI.      Objective:    BP 111/74   Pulse 91   Temp 98.4 F (36.9 C) (Oral)   Ht 5' 4.17" (1.63 m)   Wt 288 lb (130.6 kg)   SpO2 97%   BMI 49.17 kg/m   Wt Readings from Last 3 Encounters:  04/16/20 288 lb (130.6 kg)  01/01/20 292 lb 12.8 oz (132.8 kg)  12/09/19 294 lb 9.6 oz (133.6 kg)    Physical Exam Constitutional:      General: She is awake. She is not in acute distress.    Appearance: She is well-developed. She is not ill-appearing.  HENT:     Head: Normocephalic and atraumatic.     Right Ear: Hearing, tympanic membrane, ear canal and external ear normal. No drainage.     Left Ear: Hearing, tympanic membrane, ear canal and external ear normal. No drainage.     Nose: Nose normal.     Right Sinus: No maxillary sinus tenderness or frontal sinus tenderness.     Left Sinus: No maxillary sinus tenderness or frontal sinus tenderness.     Mouth/Throat:     Mouth: Mucous membranes are moist.     Pharynx: Oropharynx is clear. Uvula midline. No pharyngeal swelling, oropharyngeal exudate or posterior oropharyngeal erythema.  Eyes:     General: Lids are normal.        Right eye: No discharge.        Left eye: No discharge.     Extraocular Movements: Extraocular movements intact.     Conjunctiva/sclera: Conjunctivae normal.      Pupils: Pupils are equal, round, and reactive to light.     Visual Fields: Right eye visual fields normal and left eye visual fields normal.  Neck:     Thyroid: No thyromegaly.     Vascular: No carotid bruit.     Trachea: Trachea normal.  Cardiovascular:     Rate and Rhythm: Normal rate and regular rhythm.     Heart sounds: Normal heart sounds.  No murmur heard. No gallop.   Pulmonary:     Effort: Pulmonary effort is normal. No accessory muscle usage or respiratory distress.     Breath sounds: Normal breath sounds.  Abdominal:     General: Bowel sounds are normal.     Palpations: Abdomen is soft. There is no hepatomegaly or splenomegaly.     Tenderness: There is no abdominal tenderness.  Musculoskeletal:        General: Normal range of motion.     Cervical back: Normal range of motion and neck supple.     Right lower leg: No edema.     Left lower leg: No edema.  Lymphadenopathy:     Head:     Right side of head: No submental, submandibular, tonsillar, preauricular or posterior auricular adenopathy.     Left side of head: No submental, submandibular, tonsillar, preauricular or posterior auricular adenopathy.     Cervical: No cervical adenopathy.  Skin:    General: Skin is warm and dry.     Capillary Refill: Capillary refill takes less than 2 seconds.     Findings: No rash.  Neurological:     Mental Status: She is alert and oriented to person, place, and time.     Cranial Nerves: Cranial nerves are intact.     Gait: Gait is intact.     Deep Tendon Reflexes: Reflexes are normal and symmetric.     Reflex Scores:      Brachioradialis reflexes are 2+ on the right side and 2+ on the left side.      Patellar reflexes are 2+ on the right side and 2+ on the left side. Psychiatric:        Attention and Perception: Attention normal.        Mood and Affect: Mood normal.        Speech: Speech normal.        Behavior: Behavior normal. Behavior is cooperative.        Thought Content:  Thought content normal.        Judgment: Judgment normal.    Results for orders placed or performed in visit on 01/01/20  Bayer DCA Hb A1c Waived (STAT)  Result Value Ref Range   HB A1C (BAYER DCA - WAIVED) 4.8 <7.0 %      Assessment & Plan:   Problem List Items Addressed This Visit      Cardiovascular and Mediastinum   Essential hypertension    Chronic, stable with BP at goal.  Recommend she monitor BP at least a few mornings a week at home and document.  DASH diet at home.  Continue current medication regimen and adjust as needed.  Labs today: CMP and CBC.  Refills sent.  Return in 6 months.       Relevant Medications   Olmesartan-amLODIPine-HCTZ 40-5-12.5 MG TABS   Other Relevant Orders   CBC with Differential/Platelet   Comprehensive metabolic panel   Lipid Panel w/o Chol/HDL Ratio     Endocrine   Hypothyroidism    Chronic, ongoing.  Continue current medication regimen and adjust as needed.  TSH and Free T4 today.  Return in 6 months.        Relevant Medications   levothyroxine (SYNTHROID) 88 MCG tablet   Other Relevant Orders   TSH   T4, free     Other   BMI 40.0-44.9, adult (HCC)    Refer to Morbid Obesity plan of care.      Hypocalcemia  Ongoing, continue supplements and check CMP today.  Scheduled for DEXA.      Relevant Orders   Comprehensive metabolic panel   Morbid obesity (HCC) - Primary    BMI 49.17 with HTN.  Recommended eating smaller high protein, low fat meals more frequently and exercising 30 mins a day 5 times a week with a goal of 10-15lb weight loss in the next 3 months. Patient voiced their understanding and motivation to adhere to these recommendations.        Other Visit Diagnoses    Annual physical exam       Annual labs today to include CBC, CMP, TSH, lipid.  Health maintenance reviewed, refuses vaccines.       Follow up plan: Return in about 6 months (around 10/14/2020) for HTN, THYROID.   LABORATORY TESTING:  - Pap smear:  not applicable  IMMUNIZATIONS:   - Tdap: Tetanus vaccination status reviewed: refused. - Influenza: Refused - Pneumovax: Not applicable - Prevnar: Not applicable - HPV: Not applicable - Zostavax vaccine: Refused  SCREENING: -Mammogram: Up to date  - Colonoscopy: Up to date  - Bone Density: Up to date  -Hearing Test: Not applicable  -Spirometry: Not applicable   PATIENT COUNSELING:   Advised to take 1 mg of folate supplement per day if capable of pregnancy.   Sexuality: Discussed sexually transmitted diseases, partner selection, use of condoms, avoidance of unintended pregnancy  and contraceptive alternatives.   Advised to avoid cigarette smoking.  I discussed with the patient that most people either abstain from alcohol or drink within safe limits (<=14/week and <=4 drinks/occasion for males, <=7/weeks and <= 3 drinks/occasion for females) and that the risk for alcohol disorders and other health effects rises proportionally with the number of drinks per week and how often a drinker exceeds daily limits.  Discussed cessation/primary prevention of drug use and availability of treatment for abuse.   Diet: Encouraged to adjust caloric intake to maintain  or achieve ideal body weight, to reduce intake of dietary saturated fat and total fat, to limit sodium intake by avoiding high sodium foods and not adding table salt, and to maintain adequate dietary potassium and calcium preferably from fresh fruits, vegetables, and low-fat dairy products.    Stressed the importance of regular exercise  Injury prevention: Discussed safety belts, safety helmets, smoke detector, smoking near bedding or upholstery.   Dental health: Discussed importance of regular tooth brushing, flossing, and dental visits.    NEXT PREVENTATIVE PHYSICAL DUE IN 1 YEAR. Return in about 6 months (around 10/14/2020) for HTN, THYROID.

## 2020-04-16 NOTE — Patient Instructions (Signed)

## 2020-04-16 NOTE — Assessment & Plan Note (Signed)
Ongoing, continue supplements and check CMP today.  Scheduled for DEXA.

## 2020-04-16 NOTE — Assessment & Plan Note (Signed)
BMI 49.17 with HTN.  Recommended eating smaller high protein, low fat meals more frequently and exercising 30 mins a day 5 times a week with a goal of 10-15lb weight loss in the next 3 months. Patient voiced their understanding and motivation to adhere to these recommendations.

## 2020-04-17 ENCOUNTER — Other Ambulatory Visit: Payer: Self-pay | Admitting: Nurse Practitioner

## 2020-04-17 LAB — COMPREHENSIVE METABOLIC PANEL
ALT: 8 IU/L (ref 0–32)
AST: 11 IU/L (ref 0–40)
Albumin/Globulin Ratio: 1.6 (ref 1.2–2.2)
Albumin: 4.2 g/dL (ref 3.8–4.8)
Alkaline Phosphatase: 61 IU/L (ref 44–121)
BUN/Creatinine Ratio: 17 (ref 12–28)
BUN: 16 mg/dL (ref 8–27)
Bilirubin Total: 0.4 mg/dL (ref 0.0–1.2)
CO2: 22 mmol/L (ref 20–29)
Calcium: 7.5 mg/dL — ABNORMAL LOW (ref 8.7–10.3)
Chloride: 100 mmol/L (ref 96–106)
Creatinine, Ser: 0.96 mg/dL (ref 0.57–1.00)
GFR calc Af Amer: 72 mL/min/{1.73_m2} (ref >59)
GFR calc non Af Amer: 62 mL/min/{1.73_m2} (ref >59)
Globulin, Total: 2.6 g/dL (ref 1.5–4.5)
Glucose: 86 mg/dL (ref 65–99)
Potassium: 3.9 mmol/L (ref 3.5–5.2)
Sodium: 140 mmol/L (ref 134–144)
Total Protein: 6.8 g/dL (ref 6.0–8.5)

## 2020-04-17 LAB — T4, FREE: Free T4: 1.26 ng/dL (ref 0.82–1.77)

## 2020-04-17 LAB — CBC WITH DIFFERENTIAL/PLATELET
Basophils Absolute: 0.1 10*3/uL (ref 0.0–0.2)
Basos: 1 %
EOS (ABSOLUTE): 0.1 10*3/uL (ref 0.0–0.4)
Eos: 2 %
Hematocrit: 36.8 % (ref 34.0–46.6)
Hemoglobin: 11.7 g/dL (ref 11.1–15.9)
Immature Grans (Abs): 0 10*3/uL (ref 0.0–0.1)
Immature Granulocytes: 0 %
Lymphocytes Absolute: 1.4 10*3/uL (ref 0.7–3.1)
Lymphs: 19 %
MCH: 27 pg (ref 26.6–33.0)
MCHC: 31.8 g/dL (ref 31.5–35.7)
MCV: 85 fL (ref 79–97)
Monocytes Absolute: 0.5 10*3/uL (ref 0.1–0.9)
Monocytes: 7 %
Neutrophils Absolute: 5.1 10*3/uL (ref 1.4–7.0)
Neutrophils: 71 %
Platelets: 399 10*3/uL (ref 150–450)
RBC: 4.34 x10E6/uL (ref 3.77–5.28)
RDW: 14.6 % (ref 11.7–15.4)
WBC: 7.2 10*3/uL (ref 3.4–10.8)

## 2020-04-17 LAB — LIPID PANEL W/O CHOL/HDL RATIO
Cholesterol, Total: 235 mg/dL — ABNORMAL HIGH (ref 100–199)
HDL: 45 mg/dL (ref >39)
LDL Chol Calc (NIH): 168 mg/dL — ABNORMAL HIGH (ref 0–99)
Triglycerides: 124 mg/dL (ref 0–149)
VLDL Cholesterol Cal: 22 mg/dL (ref 5–40)

## 2020-04-17 LAB — TSH: TSH: 4.5 u[IU]/mL (ref 0.450–4.500)

## 2020-04-17 NOTE — Progress Notes (Signed)
Good afternoon, please let Kerry Phillips know her labs have returned.  CBC is normal and shows no anemia.  Kidney and liver function are stable, but her calcium continues to be low and lower then previous checks.  Please ensure you are taking your calcium pills daily.  I would like you to return next week for a lab visit only to check your parathyroid and Vitamin D labs + recheck calcium, please schedule this.  Thyroid labs are normal, continue current Levothyroxine dosing.  Cholesterol levels are elevated, I do recommend starting statin therapy for stroke prevention, definitely think about this and we can discuss more next visit.  Any questions? Keep being awesome!!  Thank you for allowing me to participate in your care. Kindest regards, Corrie Dandy'

## 2020-04-22 ENCOUNTER — Other Ambulatory Visit: Payer: Self-pay

## 2020-04-29 ENCOUNTER — Other Ambulatory Visit: Payer: Medicare Other

## 2020-04-29 ENCOUNTER — Other Ambulatory Visit: Payer: Self-pay

## 2020-04-30 LAB — PHOSPHORUS: Phosphorus: 3.3 mg/dL (ref 3.0–4.3)

## 2020-04-30 LAB — MAGNESIUM: Magnesium: 1.9 mg/dL (ref 1.6–2.3)

## 2020-04-30 LAB — VITAMIN D 25 HYDROXY (VIT D DEFICIENCY, FRACTURES): Vit D, 25-Hydroxy: 24.5 ng/mL — ABNORMAL LOW (ref 30.0–100.0)

## 2020-04-30 LAB — PTH, INTACT AND CALCIUM
Calcium: 9 mg/dL (ref 8.7–10.3)
PTH: 25 pg/mL (ref 15–65)

## 2020-04-30 NOTE — Progress Notes (Signed)
Good morning, please let Gertie know her labs have returned and overall everything looks normal with exception of Vitamin D which is on low side, I recommend taking Vitamin D3 2000 units daily which you can get over the counter in the vitamin section.  Start taking this daily and we will recheck all levels next visit.  Your calcium was in normal range this check.  Any questions? Keep being awesome!!  Thank you for allowing me to participate in your care. Kindest regards, Jolene

## 2020-05-29 ENCOUNTER — Ambulatory Visit
Admission: RE | Admit: 2020-05-29 | Discharge: 2020-05-29 | Disposition: A | Discharge status: Home / Self Care | Payer: Medicare Other | Source: Ambulatory Visit | Attending: Family Medicine | Admitting: Family Medicine

## 2020-05-29 ENCOUNTER — Other Ambulatory Visit: Payer: Self-pay

## 2020-05-29 DIAGNOSIS — Z780 Asymptomatic menopausal state: Secondary | ICD-10-CM

## 2020-06-01 NOTE — Progress Notes (Signed)
Please let Mary-Anne know her bone density returned normal.  Great news!!  No medications needed at this time. Keep being awesome!!  Thank you for allowing me to participate in your care. Kindest regards, Kam Kushnir

## 2020-07-09 ENCOUNTER — Other Ambulatory Visit: Payer: Self-pay

## 2020-07-09 MED ORDER — CALCITRIOL 0.5 MCG PO CAPS: 0.5000 ug | ORAL_CAPSULE | Freq: Two times a day (BID) | ORAL | 4 refills | Status: DC

## 2020-07-10 ENCOUNTER — Other Ambulatory Visit: Payer: Self-pay | Admitting: Nurse Practitioner

## 2020-07-10 DIAGNOSIS — Z1231 Encounter for screening mammogram for malignant neoplasm of breast: Secondary | ICD-10-CM

## 2020-09-07 ENCOUNTER — Ambulatory Visit
Admission: RE | Admit: 2020-09-07 | Discharge: 2020-09-07 | Disposition: A | Discharge status: Home / Self Care | Payer: Medicare Other | Source: Ambulatory Visit | Attending: Nurse Practitioner | Admitting: Nurse Practitioner

## 2020-09-07 ENCOUNTER — Other Ambulatory Visit: Payer: Self-pay

## 2020-09-07 DIAGNOSIS — Z1231 Encounter for screening mammogram for malignant neoplasm of breast: Secondary | ICD-10-CM

## 2020-10-15 ENCOUNTER — Encounter: Payer: Self-pay | Admitting: Nurse Practitioner

## 2020-10-15 ENCOUNTER — Ambulatory Visit (INDEPENDENT_AMBULATORY_CARE_PROVIDER_SITE_OTHER): Payer: Medicare Other | Admitting: Nurse Practitioner

## 2020-10-15 ENCOUNTER — Other Ambulatory Visit: Payer: Self-pay

## 2020-10-15 DIAGNOSIS — E7800 Pure hypercholesterolemia, unspecified: Secondary | ICD-10-CM | POA: Diagnosis not present

## 2020-10-15 DIAGNOSIS — I10 Essential (primary) hypertension: Secondary | ICD-10-CM

## 2020-10-15 DIAGNOSIS — Z6841 Body Mass Index (BMI) 40.0 and over, adult: Secondary | ICD-10-CM | POA: Diagnosis not present

## 2020-10-15 DIAGNOSIS — E782 Mixed hyperlipidemia: Secondary | ICD-10-CM | POA: Insufficient documentation

## 2020-10-15 DIAGNOSIS — E559 Vitamin D deficiency, unspecified: Secondary | ICD-10-CM | POA: Insufficient documentation

## 2020-10-15 DIAGNOSIS — E039 Hypothyroidism, unspecified: Secondary | ICD-10-CM

## 2020-10-15 NOTE — Assessment & Plan Note (Signed)
Chronic, stable with BP at goal.  Recommend she monitor BP at least a few mornings a week at home and document.  DASH diet at home.  Continue current medication regimen and adjust as needed.  Labs today: BMP.  Refills up to date.  Return in 6 months.

## 2020-10-15 NOTE — Assessment & Plan Note (Signed)
Recent labs with normal CA+ and PTH, check BMP today.

## 2020-10-15 NOTE — Assessment & Plan Note (Signed)
Noted on labs recently with ASCVD 8.9%.  Continue diet focus and recheck fasting labs today.

## 2020-10-15 NOTE — Assessment & Plan Note (Signed)
BMI 48.62.  Recommended eating smaller high protein, low fat meals more frequently and exercising 30 mins a day 5 times a week with a goal of 10-15lb weight loss in the next 3 months. Patient voiced their understanding and motivation to adhere to these recommendations.

## 2020-10-15 NOTE — Assessment & Plan Note (Signed)
Ongoing, taking daily supplement, continue this and check level today. 

## 2020-10-15 NOTE — Assessment & Plan Note (Signed)
BMI 48.62 with HTN and HLD.  Recommended eating smaller high protein, low fat meals more frequently and exercising 30 mins a day 5 times a week with a goal of 10-15lb weight loss in the next 3 months. Patient voiced their understanding and motivation to adhere to these recommendations.

## 2020-10-15 NOTE — Progress Notes (Signed)
BP 124/77   Pulse 97   Temp 98.8 F (37.1 C) (Oral)   Wt 284 lb 12.8 oz (129.2 kg)   SpO2 93%   BMI 48.62 kg/m    Subjective:    Patient ID: Kerry Phillips, female    DOB: 12/04/1955, 65 y.o.   MRN: [id number]  HPI: Kerry Phillips is a 65 y.o. female  Chief Complaint  Patient presents with   Hypertension   Hypothyroidism   HYPERTENSION Taking Olmesartan-Amlodipine- HCTZ daily.  Calcium levels in 8 range, continues on supplements -- recent level was normal at 9.  Vitamin was low, is taking supplements. Normal DEXA on 05/29/20. Has lost almost 10 pounds since December. Hypertension status: controlled  Satisfied with current treatment? yes Duration of hypertension: chronic BP monitoring frequency:  rarely BP range:  BP medication side effects:  no Medication compliance: good compliance Aspirin: no Recurrent headaches: no Visual changes: no Palpitations: no Dyspnea: no Chest pain: no Lower extremity edema: no Dizzy/lightheaded: no  The 10-year ASCVD risk score Denman George DC Jr., et al., 2013) is: 8.9%   Values used to calculate the score:     Age: 73 years     Sex: Female     Is Non-Hispanic African American: Yes     Diabetic: No     Tobacco smoker: No     Systolic Blood Pressure: 124 mmHg     Is BP treated: No     HDL Cholesterol: 45 mg/dL     Total Cholesterol: 235 mg/dL  HYPOTHYROIDISM Taking Levothyroxine 88 MCG daily. Thyroid control status:stable Satisfied with current treatment? yes Medication side effects: no Medication compliance: good compliance Etiology of hypothyroidism:  Recent dose adjustment:no Fatigue: no Cold intolerance: no Heat intolerance: no Weight gain: no Weight loss: no Constipation: no Diarrhea/loose stools: no Palpitations: no Lower extremity edema: no Anxiety/depressed mood: no  Relevant past medical, surgical, family and social history reviewed and updated as indicated. Interim medical history since our last visit  reviewed. Allergies and medications reviewed and updated.  Review of Systems  Constitutional:  Negative for activity change, appetite change, diaphoresis, fatigue and fever.  Respiratory:  Negative for cough, chest tightness and shortness of breath.   Cardiovascular:  Negative for chest pain, palpitations and leg swelling.  Endocrine: Negative for cold intolerance and heat intolerance.  Neurological: Negative.   Psychiatric/Behavioral: Negative.     Per HPI unless specifically indicated above     Objective:    BP 124/77   Pulse 97   Temp 98.8 F (37.1 C) (Oral)   Wt 284 lb 12.8 oz (129.2 kg)   SpO2 93%   BMI 48.62 kg/m   Wt Readings from Last 3 Encounters:  10/15/20 284 lb 12.8 oz (129.2 kg)  04/16/20 288 lb (130.6 kg)  01/01/20 292 lb 12.8 oz (132.8 kg)    Physical Exam Vitals and nursing note reviewed.  Constitutional:      General: She is awake. She is not in acute distress.    Appearance: She is well-developed and well-groomed. She is obese. She is not ill-appearing or toxic-appearing.  HENT:     Head: Normocephalic.     Right Ear: Hearing normal.     Left Ear: Hearing normal.  Eyes:     General: Lids are normal.        Right eye: No discharge.        Left eye: No discharge.     Conjunctiva/sclera: Conjunctivae normal.     Pupils:  Pupils are equal, round, and reactive to light.  Neck:     Thyroid: No thyromegaly.     Vascular: No carotid bruit.  Cardiovascular:     Rate and Rhythm: Normal rate and regular rhythm.     Heart sounds: Normal heart sounds. No murmur heard.   No gallop.  Pulmonary:     Effort: Pulmonary effort is normal. No accessory muscle usage or respiratory distress.     Breath sounds: Normal breath sounds.  Abdominal:     General: Bowel sounds are normal.     Palpations: Abdomen is soft.  Musculoskeletal:     Cervical back: Normal range of motion and neck supple.     Right lower leg: No edema.     Left lower leg: No edema.   Lymphadenopathy:     Cervical: No cervical adenopathy.  Skin:    General: Skin is warm and dry.  Neurological:     Mental Status: She is alert and oriented to person, place, and time.  Psychiatric:        Attention and Perception: Attention normal.        Mood and Affect: Mood normal.        Speech: Speech normal.        Behavior: Behavior normal. Behavior is cooperative.        Thought Content: Thought content normal.   Results for orders placed or performed in visit on 04/29/20  VITAMIN D 25 Hydroxy (Vit-D Deficiency, Fractures)  Result Value Ref Range   Vit D, 25-Hydroxy 24.5 (L) 30.0 - 100.0 ng/mL  Phosphorus  Result Value Ref Range   Phosphorus 3.3 3.0 - 4.3 mg/dL  Magnesium  Result Value Ref Range   Magnesium 1.9 1.6 - 2.3 mg/dL  PTH, Intact and Calcium  Result Value Ref Range   Calcium 9.0 8.7 - 10.3 mg/dL   PTH 25 15 - 65 pg/mL   PTH Interp Comment       Assessment & Plan:   Problem List Items Addressed This Visit       Cardiovascular and Mediastinum   Essential hypertension    Chronic, stable with BP at goal.  Recommend she monitor BP at least a few mornings a week at home and document.  DASH diet at home.  Continue current medication regimen and adjust as needed.  Labs today: BMP.  Refills up to date.  Return in 6 months.       Relevant Orders   Basic metabolic panel     Endocrine   Hypothyroidism    Chronic, ongoing.  Continue current medication regimen and adjust as needed.  TSH and Free T4 stable recent visit.  Return in 6 months.          Other   BMI 45.0-49.9, adult (HCC)    BMI 48.62.  Recommended eating smaller high protein, low fat meals more frequently and exercising 30 mins a day 5 times a week with a goal of 10-15lb weight loss in the next 3 months. Patient voiced their understanding and motivation to adhere to these recommendations.       Hypocalcemia    Recent labs with normal CA+ and PTH, check BMP today.      Morbid obesity (HCC)  - Primary    BMI 48.62 with HTN and HLD.  Recommended eating smaller high protein, low fat meals more frequently and exercising 30 mins a day 5 times a week with a goal of 10-15lb weight loss in the next 3  months. Patient voiced their understanding and motivation to adhere to these recommendations.       Elevated LDL cholesterol level    Noted on labs recently with ASCVD 8.9%.  Continue diet focus and recheck fasting labs today.      Relevant Orders   Lipid Panel w/o Chol/HDL Ratio   Vitamin D deficiency    Ongoing, taking daily supplement, continue this and check level today.      Relevant Orders   VITAMIN D 25 Hydroxy (Vit-D Deficiency, Fractures)     Follow up plan: Return in about 6 months (around 04/17/2021) for Annual physical due after 04/16/21.

## 2020-10-15 NOTE — Patient Instructions (Signed)
https://www.nhlbi.nih.gov/files/docs/public/heart/dash_brief.pdf">  DASH Eating Plan DASH stands for Dietary Approaches to Stop Hypertension. The DASH eating plan is a healthy eating plan that has been shown to: Reduce high blood pressure (hypertension). Reduce your risk for type 2 diabetes, heart disease, and stroke. Help with weight loss. What are tips for following this plan? Reading food labels Check food labels for the amount of salt (sodium) per serving. Choose foods with less than 5 percent of the Daily Value of sodium. Generally, foods with less than 300 milligrams (mg) of sodium per serving fit into this eating plan. To find whole grains, look for the word "whole" as the first word in the ingredient list. Shopping Buy products labeled as "low-sodium" or "no salt added." Buy fresh foods. Avoid canned foods and pre-made or frozen meals. Cooking Avoid adding salt when cooking. Use salt-free seasonings or herbs instead of table salt or sea salt. Check with your health care provider or pharmacist before using salt substitutes. Do not fry foods. Cook foods using healthy methods such as baking, boiling, grilling, roasting, and broiling instead. Cook with heart-healthy oils, such as olive, canola, avocado, soybean, or sunflower oil. Meal planning  Eat a balanced diet that includes: 4 or more servings of fruits and 4 or more servings of vegetables each day. Try to fill one-half of your plate with fruits and vegetables. 6-8 servings of whole grains each day. Less than 6 oz (170 g) of lean meat, poultry, or fish each day. A 3-oz (85-g) serving of meat is about the same size as a deck of cards. One egg equals 1 oz (28 g). 2-3 servings of low-fat dairy each day. One serving is 1 cup (237 mL). 1 serving of nuts, seeds, or beans 5 times each week. 2-3 servings of heart-healthy fats. Healthy fats called omega-3 fatty acids are found in foods such as walnuts, flaxseeds, fortified milks, and eggs.  These fats are also found in cold-water fish, such as sardines, salmon, and mackerel. Limit how much you eat of: Canned or prepackaged foods. Food that is high in trans fat, such as some fried foods. Food that is high in saturated fat, such as fatty meat. Desserts and other sweets, sugary drinks, and other foods with added sugar. Full-fat dairy products. Do not salt foods before eating. Do not eat more than 4 egg yolks a week. Try to eat at least 2 vegetarian meals a week. Eat more home-cooked food and less restaurant, buffet, and fast food.  Lifestyle When eating at a restaurant, ask that your food be prepared with less salt or no salt, if possible. If you drink alcohol: Limit how much you use to: 0-1 drink a day for women who are not pregnant. 0-2 drinks a day for men. Be aware of how much alcohol is in your drink. In the [place] one drink equals one 12 oz bottle of beer (355 mL), one 5 oz glass of wine (148 mL), or one 1 oz glass of hard liquor (44 mL). General information Avoid eating more than 2,300 mg of salt a day. If you have hypertension, you may need to reduce your sodium intake to 1,500 mg a day. Work with your health care provider to maintain a healthy body weight or to lose weight. Ask what an ideal weight is for you. Get at least 30 minutes of exercise that causes your heart to beat faster (aerobic exercise) most days of the week. Activities may include walking, swimming, or biking. Work with your health care provider   or dietitian to adjust your eating plan to your individual calorie needs. What foods should I eat? Fruits All fresh, dried, or frozen fruit. Canned fruit in natural juice (without addedsugar). Vegetables Fresh or frozen vegetables (raw, steamed, roasted, or grilled). Low-sodium or reduced-sodium tomato and vegetable juice. Low-sodium or reduced-sodium tomatosauce and tomato paste. Low-sodium or reduced-sodium canned vegetables. Grains Whole-grain or  whole-wheat bread. Whole-grain or whole-wheat pasta. Brown rice. Oatmeal. Quinoa. Bulgur. Whole-grain and low-sodium cereals. Pita bread.Low-fat, low-sodium crackers. Whole-wheat flour tortillas. Meats and other proteins Skinless chicken or turkey. Ground chicken or turkey. Pork with fat trimmed off. Fish and seafood. Egg whites. Dried beans, peas, or lentils. Unsalted nuts, nut butters, and seeds. Unsalted canned beans. Lean cuts of beef with fat trimmed off. Low-sodium, lean precooked or cured meat, such as sausages or meatloaves. Dairy Low-fat (1%) or fat-free (skim) milk. Reduced-fat, low-fat, or fat-free cheeses. Nonfat, low-sodium ricotta or cottage cheese. Low-fat or nonfatyogurt. Low-fat, low-sodium cheese. Fats and oils Soft margarine without trans fats. Vegetable oil. Reduced-fat, low-fat, or light mayonnaise and salad dressings (reduced-sodium). Canola, safflower, olive, avocado, soybean, andsunflower oils. Avocado. Seasonings and condiments Herbs. Spices. Seasoning mixes without salt. Other foods Unsalted popcorn and pretzels. Fat-free sweets. The items listed above may not be a complete list of foods and beverages you can eat. Contact a dietitian for more information. What foods should I avoid? Fruits Canned fruit in a light or heavy syrup. Fried fruit. Fruit in cream or buttersauce. Vegetables Creamed or fried vegetables. Vegetables in a cheese sauce. Regular canned vegetables (not low-sodium or reduced-sodium). Regular canned tomato sauce and paste (not low-sodium or reduced-sodium). Regular tomato and vegetable juice(not low-sodium or reduced-sodium). Pickles. Olives. Grains Baked goods made with fat, such as croissants, muffins, or some breads. Drypasta or rice meal packs. Meats and other proteins Fatty cuts of meat. Ribs. Fried meat. Bacon. Bologna, salami, and other precooked or cured meats, such as sausages or meat loaves. Fat from the back of a pig (fatback). Bratwurst.  Salted nuts and seeds. Canned beans with added salt. Canned orsmoked fish. Whole eggs or egg yolks. Chicken or turkey with skin. Dairy Whole or 2% milk, cream, and half-and-half. Whole or full-fat cream cheese. Whole-fat or sweetened yogurt. Full-fat cheese. Nondairy creamers. Whippedtoppings. Processed cheese and cheese spreads. Fats and oils Butter. Stick margarine. Lard. Shortening. Ghee. Bacon fat. Tropical oils, suchas coconut, palm kernel, or palm oil. Seasonings and condiments Onion salt, garlic salt, seasoned salt, table salt, and sea salt. Worcestershire sauce. Tartar sauce. Barbecue sauce. Teriyaki sauce. Soy sauce, including reduced-sodium. Steak sauce. Canned and packaged gravies. Fish sauce. Oyster sauce. Cocktail sauce. Store-bought horseradish. Ketchup. Mustard. Meat flavorings and tenderizers. Bouillon cubes. Hot sauces. Pre-made or packaged marinades. Pre-made or packaged taco seasonings. Relishes. Regular saladdressings. Other foods Salted popcorn and pretzels. The items listed above may not be a complete list of foods and beverages you should avoid. Contact a dietitian for more information. Where to find more information National Heart, Lung, and Blood Institute: www.nhlbi.nih.gov American Heart Association: www.heart.org Academy of Nutrition and Dietetics: www.eatright.org National Kidney Foundation: www.kidney.org Summary The DASH eating plan is a healthy eating plan that has been shown to reduce high blood pressure (hypertension). It may also reduce your risk for type 2 diabetes, heart disease, and stroke. When on the DASH eating plan, aim to eat more fresh fruits and vegetables, whole grains, lean proteins, low-fat dairy, and heart-healthy fats. With the DASH eating plan, you should limit salt (sodium) intake to 2,300   mg a day. If you have hypertension, you may need to reduce your sodium intake to 1,500 mg a day. Work with your health care provider or dietitian to adjust  your eating plan to your individual calorie needs. This information is not intended to replace advice given to you by your health care provider. Make sure you discuss any questions you have with your healthcare provider. Document Revised: 01/18/2019 Document Reviewed: 01/18/2019 Elsevier Patient Education  2022 Elsevier Inc.  

## 2020-10-15 NOTE — Assessment & Plan Note (Signed)
Chronic, ongoing.  Continue current medication regimen and adjust as needed.  TSH and Free T4 stable recent visit.  Return in 6 months.

## 2020-10-16 LAB — BASIC METABOLIC PANEL
BUN/Creatinine Ratio: 10 — ABNORMAL LOW (ref 12–28)
BUN: 9 mg/dL (ref 8–27)
CO2: 27 mmol/L (ref 20–29)
Calcium: 8.8 mg/dL (ref 8.7–10.3)
Chloride: 97 mmol/L (ref 96–106)
Creatinine, Ser: 0.9 mg/dL (ref 0.57–1.00)
Glucose: 86 mg/dL (ref 65–99)
Potassium: 3.9 mmol/L (ref 3.5–5.2)
Sodium: 139 mmol/L (ref 134–144)
eGFR: 71 mL/min/{1.73_m2} (ref >59)

## 2020-10-16 LAB — LIPID PANEL W/O CHOL/HDL RATIO
Cholesterol, Total: 211 mg/dL — ABNORMAL HIGH (ref 100–199)
HDL: 49 mg/dL (ref >39)
LDL Chol Calc (NIH): 142 mg/dL — ABNORMAL HIGH (ref 0–99)
Triglycerides: 109 mg/dL (ref 0–149)
VLDL Cholesterol Cal: 20 mg/dL (ref 5–40)

## 2020-10-16 LAB — VITAMIN D 25 HYDROXY (VIT D DEFICIENCY, FRACTURES): Vit D, 25-Hydroxy: 20.1 ng/mL — ABNORMAL LOW (ref 30.0–100.0)

## 2020-10-16 NOTE — Progress Notes (Signed)
Good morning, please let Kerry Phillips know her labs have returned.  Kidney function and electrolytes remain stable.  Vitamin D remains on low side, please ensure you are taking Vitamin D3 2000 units daily and we will recheck next visit.  Cholesterol levels remain elevated, but risk score on calculation remains low.  Recommend continued diet focus and we will continue to monitor.  Any questions? Keep being awesome!!  Thank you for allowing me to participate in your care.  I appreciate you. Kindest regards, Drevion Offord

## 2021-02-10 ENCOUNTER — Telehealth: Payer: Self-pay | Admitting: Nurse Practitioner

## 2021-02-10 NOTE — Telephone Encounter (Signed)
Copied from CRM [id number]. Topic: Medicare AWV >> Feb 10, 2021  9:08 AM Leigh Aurora wrote: Reason for CRM: Left message for patient to call back and schedule Medicare Annual Wellness Visit (AWV) to be done virtually or by telephone.  No hx of AWV eligible as of 10/29/20  Please schedule at anytime with [facility] Advisor.      45 Minutes appointment   Any questions, please call me at [phone]

## 2021-02-15 ENCOUNTER — Ambulatory Visit (INDEPENDENT_AMBULATORY_CARE_PROVIDER_SITE_OTHER): Payer: Medicare Other | Admitting: *Deleted

## 2021-02-15 DIAGNOSIS — Z0000 Encounter for general adult medical examination without abnormal findings: Secondary | ICD-10-CM

## 2021-02-15 NOTE — Patient Instructions (Signed)
Kerry Phillips , Thank you for taking time to come for your Medicare Wellness Visit. I appreciate your ongoing commitment to your health goals. Please review the following plan we discussed and let me know if I can assist you in the future.   Screening recommendations/referrals: Colonoscopy: up to date Mammogram: up to date Bone Density: up to date Recommended yearly ophthalmology/optometry visit for glaucoma screening and checkup Recommended yearly dental visit for hygiene and checkup  Vaccinations: Influenza vaccine: Education provided Pneumococcal vaccine: Education provided Tdap vaccine: Education provided Shingles vaccine: Education provided    Advanced directives: Education provided  Conditions/risks identified:   Next appointment: 04-20-2021@ 10:00 [place]   Preventive Care 66 Years and Older, Female Preventive care refers to lifestyle choices and visits with your health care provider that can promote health and wellness. What does preventive care include? A yearly physical exam. This is also called an annual well check. Dental exams once or twice a year. Routine eye exams. Ask your health care provider how often you should have your eyes checked. Personal lifestyle choices, including: Daily care of your teeth and gums. Regular physical activity. Eating a healthy diet. Avoiding tobacco and drug use. Limiting alcohol use. Practicing safe sex. Taking low-dose aspirin every day. Taking vitamin and mineral supplements as recommended by your health care provider. What happens during an annual well check? The services and screenings done by your health care provider during your annual well check will depend on your age, overall health, lifestyle risk factors, and family history of disease. Counseling  Your health care provider may ask you questions about your: Alcohol use. Tobacco use. Drug use. Emotional well-being. Home and relationship well-being. Sexual activity. Eating  habits. History of falls. Memory and ability to understand (cognition). Work and work Astronomer. Reproductive health. Screening  You may have the following tests or measurements: Height, weight, and BMI. Blood pressure. Lipid and cholesterol levels. These may be checked every 5 years, or more frequently if you are over 66 years old. Skin check. Lung cancer screening. You may have this screening every year starting at age 66 if you have a 30-pack-year history of smoking and currently smoke or have quit within the past 15 years. Fecal occult blood test (FOBT) of the stool. You may have this test every year starting at age 66. Flexible sigmoidoscopy or colonoscopy. You may have a sigmoidoscopy every 5 years or a colonoscopy every 10 years starting at age 66. Hepatitis C blood test. Hepatitis B blood test. Sexually transmitted disease (STD) testing. Diabetes screening. This is done by checking your blood sugar (glucose) after you have not eaten for a while (fasting). You may have this done every 1-3 years. Bone density scan. This is done to screen for osteoporosis. You may have this done starting at age 66. Mammogram. This may be done every 1-2 years. Talk to your health care provider about how often you should have regular mammograms. Talk with your health care provider about your test results, treatment options, and if necessary, the need for more tests. Vaccines  Your health care provider may recommend certain vaccines, such as: Influenza vaccine. This is recommended every year. Tetanus, diphtheria, and acellular pertussis (Tdap, Td) vaccine. You may need a Td booster every 10 years. Zoster vaccine. You may need this after age 40. Pneumococcal 13-valent conjugate (PCV13) vaccine. One dose is recommended after age 66. Pneumococcal polysaccharide (PPSV23) vaccine. One dose is recommended after age 66. Talk to your health care provider about which screenings and  vaccines you need and how  often you need them. This information is not intended to replace advice given to you by your health care provider. Make sure you discuss any questions you have with your health care provider. Document Released: 03/13/2015 Document Revised: 11/04/2015 Document Reviewed: 12/16/2014 Elsevier Interactive Patient Education  2017 [place] Prevention in the Home Falls can cause injuries. They can happen to people of all ages. There are many things you can do to make your home safe and to help prevent falls. What can I do on the outside of my home? Regularly fix the edges of walkways and driveways and fix any cracks. Remove anything that might make you trip as you walk through a door, such as a raised step or threshold. Trim any bushes or trees on the path to your home. Use bright outdoor lighting. Clear any walking paths of anything that might make someone trip, such as rocks or tools. Regularly check to see if handrails are loose or broken. Make sure that both sides of any steps have handrails. Any raised decks and porches should have guardrails on the edges. Have any leaves, snow, or ice cleared regularly. Use sand or salt on walking paths during winter. Clean up any spills in your garage right away. This includes oil or grease spills. What can I do in the bathroom? Use night lights. Install grab bars by the toilet and in the tub and shower. Do not use towel bars as grab bars. Use non-skid mats or decals in the tub or shower. If you need to sit down in the shower, use a plastic, non-slip stool. Keep the floor dry. Clean up any water that spills on the floor as soon as it happens. Remove soap buildup in the tub or shower regularly. Attach bath mats securely with double-sided non-slip rug tape. Do not have throw rugs and other things on the floor that can make you trip. What can I do in the bedroom? Use night lights. Make sure that you have a light by your bed that is easy to  reach. Do not use any sheets or blankets that are too big for your bed. They should not hang down onto the floor. Have a firm chair that has side arms. You can use this for support while you get dressed. Do not have throw rugs and other things on the floor that can make you trip. What can I do in the kitchen? Clean up any spills right away. Avoid walking on wet floors. Keep items that you use a lot in easy-to-reach places. If you need to reach something above you, use a strong step stool that has a grab bar. Keep electrical cords out of the way. Do not use floor polish or wax that makes floors slippery. If you must use wax, use non-skid floor wax. Do not have throw rugs and other things on the floor that can make you trip. What can I do with my stairs? Do not leave any items on the stairs. Make sure that there are handrails on both sides of the stairs and use them. Fix handrails that are broken or loose. Make sure that handrails are as long as the stairways. Check any carpeting to make sure that it is firmly attached to the stairs. Fix any carpet that is loose or worn. Avoid having throw rugs at the top or bottom of the stairs. If you do have throw rugs, attach them to the floor with carpet tape. Make  sure that you have a light switch at the top of the stairs and the bottom of the stairs. If you do not have them, ask someone to add them for you. What else can I do to help prevent falls? Wear shoes that: Do not have high heels. Have rubber bottoms. Are comfortable and fit you well. Are closed at the toe. Do not wear sandals. If you use a stepladder: Make sure that it is fully opened. Do not climb a closed stepladder. Make sure that both sides of the stepladder are locked into place. Ask someone to hold it for you, if possible. Clearly mark and make sure that you can see: Any grab bars or handrails. First and last steps. Where the edge of each step is. Use tools that help you move  around (mobility aids) if they are needed. These include: Canes. Walkers. Scooters. Crutches. Turn on the lights when you go into a dark area. Replace any light bulbs as soon as they burn out. Set up your furniture so you have a clear path. Avoid moving your furniture around. If any of your floors are uneven, fix them. If there are any pets around you, be aware of where they are. Review your medicines with your doctor. Some medicines can make you feel dizzy. This can increase your chance of falling. Ask your doctor what other things that you can do to help prevent falls. This information is not intended to replace advice given to you by your health care provider. Make sure you discuss any questions you have with your health care provider. Document Released: 12/11/2008 Document Revised: 07/23/2015 Document Reviewed: 03/21/2014 Elsevier Interactive Patient Education  2017 [employer].

## 2021-02-15 NOTE — Progress Notes (Signed)
Subjective:   Kerry Phillips is a 65 y.o. female who presents for Medicare Annual (Subsequent) preventive examination. I connected with  Kerry Phillips on 02/15/21 by a telephone enabled telemedicine application and verified that I am speaking with the correct person using two identifiers.   I discussed the limitations of evaluation and management by telemedicine. The patient expressed understanding and agreed to proceed.  Patient location: home  Provider location: Not in office Tele-Health    Review of Systems     Cardiac Risk Factors include: advanced age (>63men, >41 women);obesity (BMI >30kg/m2);hypertension     Objective:    Today's Vitals   There is no height or weight on file to calculate BMI.  Advanced Directives 02/15/2021 01/04/2019  Does Patient Have a Medical Advance Directive? Yes No  Type of Advance Directive Healthcare Power of Attorney -  Copy of Healthcare Power of Attorney in Chart? No - copy requested -  Would patient like information on creating a medical advance directive? - No - Patient declined    Current Medications (verified) Outpatient Encounter Medications as of 02/15/2021  Medication Sig   calcitRIOL (ROCALTROL) 0.5 MCG capsule Take 1 capsule (0.5 mcg total) by mouth 2 (two) times daily.   Iron-Vitamins (GERITOL TONIC PO) Take 5 mLs by mouth daily.   levothyroxine (SYNTHROID) 88 MCG tablet Take 1 tablet (88 mcg total) by mouth daily before breakfast.   Olmesartan-amLODIPine-HCTZ 40-5-12.5 MG TABS TAKE 1 TABLET BY MOUTH ONCE DAILY   No facility-administered encounter medications on file as of 02/15/2021.    Allergies (verified) Patient has no known allergies.   History: Past Medical History:  Diagnosis Date   Arthritis    Dysphagia 01/17/2017   History of blood transfusion    with childbirth   Hypothyroidism    surgical   Past Surgical History:  Procedure Laterality Date   ABDOMINAL HYSTERECTOMY     ARTHROSCOPIC HAGLUNDS REPAIR  Right 01/04/2019   Procedure: ENDOSCOPIC RESECTION HAGLUNDS DEFORMITY;  Surgeon: Nadara Mustard, MD;  Location: [facility] OR;  Service: Orthopedics;  Laterality: Right;   COLONOSCOPY     THYROIDECTOMY     90% removed   Family History  Problem Relation Age of Onset   Hypertension Mother    Cancer Father        Lung   Stroke Sister    Hypertension Sister    Hypertension Daughter    Hypertension Son    Lupus Sister    Breast cancer Sister    Social History   Socioeconomic History   Marital status: Married    Spouse name: Not on file   Number of children: Not on file   Years of education: Not on file   Highest education level: Not on file  Occupational History   Not on file  Tobacco Use   Smoking status: Never   Smokeless tobacco: Never  Vaping Use   Vaping Use: Never used  Substance and Sexual Activity   Alcohol use: No   Drug use: Never   Sexual activity: Yes  Other Topics Concern   Not on file  Social History Narrative   Not on file   Social Determinants of Health   Financial Resource Strain: Low Risk    Difficulty of Paying Living Expenses: Not hard at all  Food Insecurity: No Food Insecurity   Worried About Programme researcher, broadcasting/film/video in the Last Year: Never true   Ran Out of Food in the Last Year: Never true  Transportation Needs: No Regulatory affairs officer (Medical): No   Lack of Transportation (Non-Medical): No  Physical Activity: Insufficiently Active   Days of Exercise per Week: 3 days   Minutes of Exercise per Session: 30 min  Stress: No Stress Concern Present   Feeling of Stress : Not at all  Social Connections: Socially Integrated   Frequency of Communication with Friends and Family: More than three times a week   Frequency of Social Gatherings with Friends and Family: Three times a week   Attends Religious Services: More than 4 times per year   Active Member of Clubs or Organizations: Yes   Attends Engineer, structural: More than  4 times per year   Marital Status: Married    Tobacco Counseling Counseling given: Not Answered   Clinical Intake:  Pre-visit preparation completed: Yes  Pain : No/denies pain     Nutritional Risks: None Diabetes: No  How often do you need to have someone help you when you read instructions, pamphlets, or other written materials from your doctor or pharmacy?: 1 - Never  Diabetic  no  Interpreter Needed?: No  Information entered by :: Remi Haggard LPN   Activities of Daily Living In your present state of health, do you have any difficulty performing the following activities: 02/15/2021  Hearing? N  Vision? N  Difficulty concentrating or making decisions? N  Walking or climbing stairs? Y  Dressing or bathing? N  Doing errands, shopping? N  Preparing Food and eating ? N  Using the Toilet? N  In the past six months, have you accidently leaked urine? N  Do you have problems with loss of bowel control? N  Managing your Medications? N  Managing your Finances? N  Some recent data might be hidden    Patient Care Team: Marjie Skiff, NP as PCP - General (Nurse Practitioner)  Indicate any recent Medical Services you may have received from other than [facility] providers in the past year (date may be approximate).     Assessment:   This is a routine wellness examination for Kerry Phillips.  Hearing/Vision screen Hearing Screening - Comments:: No trouble hearing Vision Screening - Comments:: Not up to date Unsure of name  Dietary issues and exercise activities discussed: Current Exercise Habits: Home exercise routine, Type of exercise: walking;strength training/weights (silver sneakers), Time (Minutes): 40, Frequency (Times/Week): 4, Weekly Exercise (Minutes/Week): 160, Intensity: Moderate   Goals Addressed             This Visit's Progress    Weight (lb) < 200 lb (90.7 kg)         Depression Screen PHQ 2/9 Scores 02/15/2021 04/16/2020 01/01/2020 12/09/2019 11/23/2018  10/03/2017 01/17/2017  PHQ - 2 Score 0 1 0 0 0 0 0  PHQ- 9 Score - 1 - - 0 - -    Fall Risk Fall Risk  02/15/2021 04/16/2020 01/01/2020 12/09/2019 11/23/2018  Falls in the past year? 0 - 0 0 0  Number falls in past yr: 0 - 0 0 -  Injury with Fall? 0 0 0 0 -  Risk for fall due to : - No Fall Risks No Fall Risks No Fall Risks -  Follow up Falls evaluation completed;Education provided;Falls prevention discussed - Falls evaluation completed Falls evaluation completed Falls evaluation completed    FALL RISK PREVENTION PERTAINING TO THE HOME:  Any stairs in or around the home? Yes  If so, are there any without handrails? Yes  Home  free of loose throw rugs in walkways, pet beds, electrical cords, etc? Yes  Adequate lighting in your home to reduce risk of falls? No   ASSISTIVE DEVICES UTILIZED TO PREVENT FALLS:  Life alert? No  Use of a cane, walker or w/c? No  Grab bars in the bathroom? No  Shower chair or bench in shower? No  Elevated toilet seat or a handicapped toilet? No   TIMED UP AND GO:  Was the test performed? No .    Cognitive Function:  Normal cognitive status assessed by direct observation by this Nurse Health Advisor. No abnormalities found.          Immunizations Immunization History  Administered Date(s) Administered   Td 08/20/2008    TDAP status: Due, Education has been provided regarding the importance of this vaccine. Advised may receive this vaccine at local pharmacy or Health Dept. Aware to provide a copy of the vaccination record if obtained from local pharmacy or Health Dept. Verbalized acceptance and understanding.  Flu Vaccine status: Declined, Education has been provided regarding the importance of this vaccine but patient still declined. Advised may receive this vaccine at local pharmacy or Health Dept. Aware to provide a copy of the vaccination record if obtained from local pharmacy or Health Dept. Verbalized acceptance and understanding.  Pneumococcal  vaccine status: Declined,  Education has been provided regarding the importance of this vaccine but patient still declined. Advised may receive this vaccine at local pharmacy or Health Dept. Aware to provide a copy of the vaccination record if obtained from local pharmacy or Health Dept. Verbalized acceptance and understanding.   Covid-19 vaccine status: Declined, Education has been provided regarding the importance of this vaccine but patient still declined. Advised may receive this vaccine at local pharmacy or Health Dept.or vaccine clinic. Aware to provide a copy of the vaccination record if obtained from local pharmacy or Health Dept. Verbalized acceptance and understanding.  Qualifies for Shingles Vaccine? Yes   Zostavax completed No   Shingrix Completed?: No.    Education has been provided regarding the importance of this vaccine. Patient has been advised to call insurance company to determine out of pocket expense if they have not yet received this vaccine. Advised may also receive vaccine at local pharmacy or Health Dept. Verbalized acceptance and understanding.  Screening Tests Health Maintenance  Topic Date Due   TETANUS/TDAP  08/21/2018   COVID-19 Vaccine (1) 03/03/2021 (Originally 05/11/1956)   Zoster Vaccines- Shingrix (1 of 2) 05/16/2021 (Originally 11/10/2004)   Pneumonia Vaccine 36+ Years old (1 - PCV) 02/15/2022 (Originally 11/10/1961)   MAMMOGRAM  09/08/2022   COLONOSCOPY (Pts 45-44yrs Insurance coverage will need to be confirmed)  02/17/2023   DEXA SCAN  Completed   Hepatitis C Screening  Completed   HPV VACCINES  Aged Out   INFLUENZA VACCINE  Discontinued    Health Maintenance  Health Maintenance Due  Topic Date Due   TETANUS/TDAP  08/21/2018    Colorectal cancer screening: Type of screening: Colonoscopy. Completed 2019. Repeat every 5 years  Mammogram status: Completed 2022. Repeat every year  Bone Density status: Completed 2022. Results reflect: Bone density  results: NORMAL. Repeat every   years.  Lung Cancer Screening: (Low Dose CT Chest recommended if Age 19-80 years, 30 pack-year currently smoking OR have quit w/in 15years.) does not qualify.   Lung Cancer Screening Referral:   Additional Screening:  Hepatitis C Screening: does not qualify; Completed 2018  Vision Screening: Recommended annual ophthalmology exams for early  detection of glaucoma and other disorders of the eye. Is the patient up to date with their annual eye exam?  No  Who is the provider or what is the name of the office in which the patient attends annual eye exams?  If pt is not established with a provider, would they like to be referred to a provider to establish care? No .   Dental Screening: Recommended annual dental exams for proper oral hygiene  Community Resource Referral / Chronic Care Management: CRR required this visit?  No   CCM required this visit?  No      Plan:     I have personally reviewed and noted the following in the patients chart:   Medical and social history Use of alcohol, tobacco or illicit drugs  Current medications and supplements including opioid prescriptions.  Functional ability and status Nutritional status Physical activity Advanced directives List of other physicians Hospitalizations, surgeries, and ER visits in previous 12 months Vitals Screenings to include cognitive, depression, and falls Referrals and appointments  In addition, I have reviewed and discussed with patient certain preventive protocols, quality metrics, and best practice recommendations. A written personalized care plan for preventive services as well as general preventive health recommendations were provided to patient.     Remi Haggard, LPN   16/11/9602   Nurse Notes:

## 2021-04-18 NOTE — Patient Instructions (Incomplete)
Vitamin D Deficiency Vitamin D deficiency is when your body does not have enough vitamin D. Vitamin D is important to your body because: It helps your body use other minerals. It helps to keep your bones strong and healthy. It may help to prevent some diseases. It helps your heart and other muscles work well. Not getting enough vitamin D can make your bones soft. It can also cause other health problems. What are the causes? This condition may be caused by: Not eating enough foods that contain vitamin D. Not getting enough sun. Having diseases that make it hard for your body to absorb vitamin D. Having a surgery in which a part of the stomach or a part of the small intestine is removed. Having kidney disease or liver disease. What increases the risk? You are more likely to get this condition if: You are older. You do not spend much time outdoors. You live in a nursing home. You have had broken bones. You have weak or thin bones (osteoporosis). You have a disease or condition that changes how your body absorbs vitamin D. You have dark skin. You take certain medicines. You are overweight or obese. What are the signs or symptoms? In mild cases, there may not be any symptoms. If the condition is very bad, symptoms may include: Bone pain. Muscle pain. Falling often. Broken bones caused by a minor injury. How is this treated? Treatment may include taking supplements as told by your doctor. Your doctor will tell you what dose is best for you. Supplements may include: Vitamin D. Calcium. Follow these instructions at home: Eating and drinking  Eat foods that contain vitamin D, such as: Dairy products, cereals, or juices with added vitamin D. Check the label. Fish, such as salmon or trout. Eggs. Oysters. Mushrooms. The items listed above may not be a complete list of what you can eat and drink. Contact a dietitian for more options. General instructions Take medicines and  supplements only as told by your doctor. Get regular, safe exposure to natural sunlight. Do not use a tanning bed. Maintain a healthy weight. Lose weight if needed. Keep all follow-up visits as told by your doctor. This is important. How is this prevented? You can get vitamin D by: Eating foods that naturally contain vitamin D. Eating or drinking products that have vitamin D added to them, such as cereals, juices, and milk. Taking vitamin D or a multivitamin that contains vitamin D. Being in the sun. Your body makes vitamin D when your skin is exposed to sunlight. Your body changes the sunlight into a form of the vitamin that it can use. Contact a doctor if: Your symptoms do not go away. You feel sick to your stomach (nauseous). You throw up (vomit). You poop less often than normal, or you have trouble pooping (constipation). Summary Vitamin D deficiency is when your body does not have enough vitamin D. Vitamin D helps to keep your bones strong and healthy. This condition is often treated by taking a supplement. Your doctor will tell you what dose is best for you. This information is not intended to replace advice given to you by your health care provider. Make sure you discuss any questions you have with your health care provider. Document Revised: 10/23/2017 Document Reviewed: 10/23/2017 Elsevier Patient Education  2022 Elsevier Inc.  

## 2021-04-20 ENCOUNTER — Encounter: Payer: Medicare Other | Admitting: Nurse Practitioner

## 2021-04-20 DIAGNOSIS — I10 Essential (primary) hypertension: Secondary | ICD-10-CM

## 2021-04-20 DIAGNOSIS — E7800 Pure hypercholesterolemia, unspecified: Secondary | ICD-10-CM

## 2021-04-20 DIAGNOSIS — Z6841 Body Mass Index (BMI) 40.0 and over, adult: Secondary | ICD-10-CM

## 2021-04-20 DIAGNOSIS — E039 Hypothyroidism, unspecified: Secondary | ICD-10-CM

## 2021-04-20 DIAGNOSIS — E559 Vitamin D deficiency, unspecified: Secondary | ICD-10-CM

## 2021-05-09 NOTE — Patient Instructions (Incomplete)
DASH Eating Plan °DASH stands for Dietary Approaches to Stop Hypertension. The DASH eating plan is a healthy eating plan that has been shown to: °Reduce high blood pressure (hypertension). °Reduce your risk for type 2 diabetes, heart disease, and stroke. °Help with weight loss. °What are tips for following this plan? °Reading food labels °Check food labels for the amount of salt (sodium) per serving. Choose foods with less than 5 percent of the Daily Value of sodium. Generally, foods with less than 300 milligrams (mg) of sodium per serving fit into this eating plan. °To find whole grains, look for the word "whole" as the first word in the ingredient list. °Shopping °Buy products labeled as "low-sodium" or "no salt added." °Buy fresh foods. Avoid canned foods and pre-made or frozen meals. °Cooking °Avoid adding salt when cooking. Use salt-free seasonings or herbs instead of table salt or sea salt. Check with your health care provider or pharmacist before using salt substitutes. °Do not fry foods. Cook foods using healthy methods such as baking, boiling, grilling, roasting, and broiling instead. °Cook with heart-healthy oils, such as olive, canola, avocado, soybean, or sunflower oil. °Meal planning ° °Eat a balanced diet that includes: °4 or more servings of fruits and 4 or more servings of vegetables each day. Try to fill one-half of your plate with fruits and vegetables. °6-8 servings of whole grains each day. °Less than 6 oz (170 g) of lean meat, poultry, or fish each day. A 3-oz (85-g) serving of meat is about the same size as a deck of cards. One egg equals 1 oz (28 g). °2-3 servings of low-fat dairy each day. One serving is 1 cup (237 mL). °1 serving of nuts, seeds, or beans 5 times each week. °2-3 servings of heart-healthy fats. Healthy fats called omega-3 fatty acids are found in foods such as walnuts, flaxseeds, fortified milks, and eggs. These fats are also found in cold-water fish, such as sardines, salmon,  and mackerel. °Limit how much you eat of: °Canned or prepackaged foods. °Food that is high in trans fat, such as some fried foods. °Food that is high in saturated fat, such as fatty meat. °Desserts and other sweets, sugary drinks, and other foods with added sugar. °Full-fat dairy products. °Do not salt foods before eating. °Do not eat more than 4 egg yolks a week. °Try to eat at least 2 vegetarian meals a week. °Eat more home-cooked food and less restaurant, buffet, and fast food. °Lifestyle °When eating at a restaurant, ask that your food be prepared with less salt or no salt, if possible. °If you drink alcohol: °Limit how much you use to: °0-1 drink a day for women who are not pregnant. °0-2 drinks a day for men. °Be aware of how much alcohol is in your drink. In the [place] one drink equals one 12 oz bottle of beer (355 mL), one 5 oz glass of wine (148 mL), or one 1½ oz glass of hard liquor (44 mL). °General information °Avoid eating more than 2,300 mg of salt a day. If you have hypertension, you may need to reduce your sodium intake to 1,500 mg a day. °Work with your health care provider to maintain a healthy body weight or to lose weight. Ask what an ideal weight is for you. °Get at least 30 minutes of exercise that causes your heart to beat faster (aerobic exercise) most days of the week. Activities may include walking, swimming, or biking. °Work with your health care provider or dietitian to   adjust your eating plan to your individual calorie needs. °What foods should I eat? °Fruits °All fresh, dried, or frozen fruit. Canned fruit in natural juice (without added sugar). °Vegetables °Fresh or frozen vegetables (raw, steamed, roasted, or grilled). Low-sodium or reduced-sodium tomato and vegetable juice. Low-sodium or reduced-sodium tomato sauce and tomato paste. Low-sodium or reduced-sodium canned vegetables. °Grains °Whole-grain or whole-wheat bread. Whole-grain or whole-wheat pasta. Brown rice. Oatmeal. Quinoa.  Bulgur. Whole-grain and low-sodium cereals. Pita bread. Low-fat, low-sodium crackers. Whole-wheat flour tortillas. °Meats and other proteins °Skinless chicken or turkey. Ground chicken or turkey. Pork with fat trimmed off. Fish and seafood. Egg whites. Dried beans, peas, or lentils. Unsalted nuts, nut butters, and seeds. Unsalted canned beans. Lean cuts of beef with fat trimmed off. Low-sodium, lean precooked or cured meat, such as sausages or meat loaves. °Dairy °Low-fat (1%) or fat-free (skim) milk. Reduced-fat, low-fat, or fat-free cheeses. Nonfat, low-sodium ricotta or cottage cheese. Low-fat or nonfat yogurt. Low-fat, low-sodium cheese. °Fats and oils °Soft margarine without trans fats. Vegetable oil. Reduced-fat, low-fat, or light mayonnaise and salad dressings (reduced-sodium). Canola, safflower, olive, avocado, soybean, and sunflower oils. Avocado. °Seasonings and condiments °Herbs. Spices. Seasoning mixes without salt. °Other foods °Unsalted popcorn and pretzels. Fat-free sweets. °The items listed above may not be a complete list of foods and beverages you can eat. Contact a dietitian for more information. °What foods should I avoid? °Fruits °Canned fruit in a light or heavy syrup. Fried fruit. Fruit in cream or butter sauce. °Vegetables °Creamed or fried vegetables. Vegetables in a cheese sauce. Regular canned vegetables (not low-sodium or reduced-sodium). Regular canned tomato sauce and paste (not low-sodium or reduced-sodium). Regular tomato and vegetable juice (not low-sodium or reduced-sodium). Pickles. Olives. °Grains °Baked goods made with fat, such as croissants, muffins, or some breads. Dry pasta or rice meal packs. °Meats and other proteins °Fatty cuts of meat. Ribs. Fried meat. Bacon. Bologna, salami, and other precooked or cured meats, such as sausages or meat loaves. Fat from the back of a pig (fatback). Bratwurst. Salted nuts and seeds. Canned beans with added salt. Canned or smoked fish.  Whole eggs or egg yolks. Chicken or turkey with skin. °Dairy °Whole or 2% milk, cream, and half-and-half. Whole or full-fat cream cheese. Whole-fat or sweetened yogurt. Full-fat cheese. Nondairy creamers. Whipped toppings. Processed cheese and cheese spreads. °Fats and oils °Butter. Stick margarine. Lard. Shortening. Ghee. Bacon fat. Tropical oils, such as coconut, palm kernel, or palm oil. °Seasonings and condiments °Onion salt, garlic salt, seasoned salt, table salt, and sea salt. Worcestershire sauce. Tartar sauce. Barbecue sauce. Teriyaki sauce. Soy sauce, including reduced-sodium. Steak sauce. Canned and packaged gravies. Fish sauce. Oyster sauce. Cocktail sauce. Store-bought horseradish. Ketchup. Mustard. Meat flavorings and tenderizers. Bouillon cubes. Hot sauces. Pre-made or packaged marinades. Pre-made or packaged taco seasonings. Relishes. Regular salad dressings. °Other foods °Salted popcorn and pretzels. °The items listed above may not be a complete list of foods and beverages you should avoid. Contact a dietitian for more information. °Where to find more information °National Heart, Lung, and Blood Institute: www.nhlbi.nih.gov °American Heart Association: www.heart.org °Academy of Nutrition and Dietetics: www.eatright.org °National Kidney Foundation: www.kidney.org °Summary °The DASH eating plan is a healthy eating plan that has been shown to reduce high blood pressure (hypertension). It may also reduce your risk for type 2 diabetes, heart disease, and stroke. °When on the DASH eating plan, aim to eat more fresh fruits and vegetables, whole grains, lean proteins, low-fat dairy, and heart-healthy fats. °With the DASH   eating plan, you should limit salt (sodium) intake to 2,300 mg a day. If you have hypertension, you may need to reduce your sodium intake to 1,500 mg a day. °Work with your health care provider or dietitian to adjust your eating plan to your individual calorie needs. °This information is not  intended to replace advice given to you by your health care provider. Make sure you discuss any questions you have with your health care provider. °Document Revised: 01/18/2019 Document Reviewed: 01/18/2019 °Elsevier Patient Education © 2022 [employer] Inc. ° °

## 2021-05-13 ENCOUNTER — Ambulatory Visit (INDEPENDENT_AMBULATORY_CARE_PROVIDER_SITE_OTHER): Payer: Medicare Other | Admitting: Orthopedic Surgery

## 2021-05-13 ENCOUNTER — Ambulatory Visit (INDEPENDENT_AMBULATORY_CARE_PROVIDER_SITE_OTHER): Payer: Medicare Other

## 2021-05-13 ENCOUNTER — Ambulatory Visit: Payer: Medicare Other

## 2021-05-13 ENCOUNTER — Encounter: Payer: Medicare Other | Admitting: Nurse Practitioner

## 2021-05-13 DIAGNOSIS — Z6841 Body Mass Index (BMI) 40.0 and over, adult: Secondary | ICD-10-CM

## 2021-05-13 DIAGNOSIS — M1711 Unilateral primary osteoarthritis, right knee: Secondary | ICD-10-CM

## 2021-05-13 DIAGNOSIS — E559 Vitamin D deficiency, unspecified: Secondary | ICD-10-CM

## 2021-05-13 DIAGNOSIS — I10 Essential (primary) hypertension: Secondary | ICD-10-CM

## 2021-05-13 DIAGNOSIS — Z0000 Encounter for general adult medical examination without abnormal findings: Secondary | ICD-10-CM

## 2021-05-13 DIAGNOSIS — E7800 Pure hypercholesterolemia, unspecified: Secondary | ICD-10-CM

## 2021-05-13 DIAGNOSIS — E039 Hypothyroidism, unspecified: Secondary | ICD-10-CM

## 2021-05-13 DIAGNOSIS — M1712 Unilateral primary osteoarthritis, left knee: Secondary | ICD-10-CM | POA: Diagnosis not present

## 2021-05-16 ENCOUNTER — Encounter: Payer: Self-pay | Admitting: Orthopedic Surgery

## 2021-05-16 DIAGNOSIS — M1712 Unilateral primary osteoarthritis, left knee: Secondary | ICD-10-CM

## 2021-05-16 DIAGNOSIS — M1711 Unilateral primary osteoarthritis, right knee: Secondary | ICD-10-CM | POA: Diagnosis not present

## 2021-05-16 NOTE — Progress Notes (Signed)
? ?Office Visit Note ?  ?Patient: Kerry Phillips           ?Date of Birth: 1955/09/12           ?MRN: [id number] ?Visit Date: 05/13/2021 ?             ?Requested by: Marjie Skiff, NP ?[address] ?[place],  [place] [address] ?PCP: Marjie Skiff, NP ? ?Chief Complaint  ?Patient presents with  ? Right Knee - Pain  ? Left Knee - Pain  ? ? ? ? ?HPI: ?Patient is a 66 year old woman who presents with chronic bilateral knee pain left worse than right.  Patient complains of pain minimal swelling no specific injury she has tried anti-inflammatories and topical treatments.  She states the pain is worse going up stairs she states she does have a family history of arthritis. ? ?Assessment & Plan: ?Visit Diagnoses:  ?1. Unilateral primary osteoarthritis, left knee   ?2. Unilateral primary osteoarthritis, right knee   ? ? ?Plan: Both knees were injected she tolerated this well reevaluate in 4 weeks. ? ?Follow-Up Instructions: Return in about 4 weeks (around 06/10/2021).  ? ?Ortho Exam ? ?Patient is alert, oriented, no adenopathy, well-dressed, normal affect, normal respiratory effort. ?Examination collaterals and cruciates are stable bilaterally there is crepitation with range of motion of both knees and the right knee is most tender to palpation over the medial joint line and the left knee is primarily tender to palpation of the patellofemoral joint. ? ?Imaging: ?No results found. ?No images are attached to the encounter. ? ?Labs: ?Lab Results  ?Component Value Date  ? HGBA1C 4.8 01/01/2020  ? ? ? ?Lab Results  ?Component Value Date  ? ALBUMIN 4.2 04/16/2020  ? ALBUMIN 4.4 12/09/2019  ? ALBUMIN 4.4 09/25/2018  ? ? ?Lab Results  ?Component Value Date  ? MG 1.9 04/29/2020  ? ?Lab Results  ?Component Value Date  ? VD25OH 20.1 (L) 10/15/2020  ? VD25OH 24.5 (L) 04/29/2020  ? ? ?No results found for: PREALBUMIN ?CBC EXTENDED Latest Ref Rng & Units 04/16/2020 12/09/2019 01/04/2019  ?WBC 3.4 - 10.8 x10E3/uL 7.2 6.3 6.5  ?RBC 3.77 - 5.28  x10E6/uL 4.34 4.48 4.43  ?HGB 11.1 - 15.9 g/dL 19.1 47.8 29.5  ?HCT 34.0 - 46.6 % 36.8 38.4 37.8  ?PLT 150 - 450 x10E3/uL 399 338 298  ?NEUTROABS 1.4 - 7.0 x10E3/uL 5.1 4.5 -  ?LYMPHSABS 0.7 - 3.1 x10E3/uL 1.4 1.2 -  ? ? ? ?There is no height or weight on file to calculate BMI. ? ?Orders:  ?Orders Placed This Encounter  ?Procedures  ? XR Knee 1-2 Views Left  ? XR Knee 1-2 Views Right  ? ?No orders of the defined types were placed in this encounter. ? ? ? Procedures: ?Large Joint Inj: bilateral knee on 05/16/2021 9:07 AM ?Indications: pain and diagnostic evaluation ?Details: 22 G 1.5 in needle, anteromedial approach ? ?Arthrogram: No ? ?Outcome: tolerated well, no immediate complications ?Procedure, treatment alternatives, risks and benefits explained, specific risks discussed. Consent was given by the patient. Immediately prior to procedure a time out was called to verify the correct patient, procedure, equipment, support staff and site/side marked as required. Patient was prepped and draped in the usual sterile fashion.  ? ? ? ?Clinical Data: ?No additional findings. ? ?ROS: ? ?All other systems negative, except as noted in the HPI. ?Review of Systems ? ?Objective: ?Vital Signs: There were no vitals taken for this visit. ? ?Specialty Comments:  ?  No specialty comments available. ? ?PMFS History: ?Patient Active Problem List  ? Diagnosis Date Noted  ? Elevated LDL cholesterol level 10/15/2020  ? Vitamin D deficiency 10/15/2020  ? Morbid obesity (HCC) 03/22/2018  ? Hypocalcemia 09/07/2017  ? BMI 45.0-49.9, adult (HCC) 04/05/2016  ? Arthritis of both knees 04/05/2016  ? Essential hypertension 12/22/2014  ? Hypothyroidism 12/22/2014  ? ?Past Medical History:  ?Diagnosis Date  ? Arthritis   ? Dysphagia 01/17/2017  ? History of blood transfusion   ? with childbirth  ? Hypothyroidism   ? surgical  ?  ?Family History  ?Problem Relation Age of Onset  ? Hypertension Mother   ? Cancer Father   ?     Lung  ? Stroke Sister   ?  Hypertension Sister   ? Hypertension Daughter   ? Hypertension Son   ? Lupus Sister   ? Breast cancer Sister   ?  ?Past Surgical History:  ?Procedure Laterality Date  ? ABDOMINAL HYSTERECTOMY    ? ARTHROSCOPIC HAGLUNDS REPAIR Right 01/04/2019  ? Procedure: ENDOSCOPIC RESECTION HAGLUNDS DEFORMITY;  Surgeon: Nadara Mustard, MD;  Location: [facility] OR;  Service: Orthopedics;  Laterality: Right;  ? COLONOSCOPY    ? THYROIDECTOMY    ? 90% removed  ? ?Social History  ? ?Occupational History  ? Not on file  ?Tobacco Use  ? Smoking status: Never  ? Smokeless tobacco: Never  ?Vaping Use  ? Vaping Use: Never used  ?Substance and Sexual Activity  ? Alcohol use: No  ? Drug use: Never  ? Sexual activity: Yes  ? ? ? ? ? ?

## 2021-06-05 NOTE — Patient Instructions (Signed)

## 2021-06-09 ENCOUNTER — Encounter: Payer: Self-pay | Admitting: Nurse Practitioner

## 2021-06-09 ENCOUNTER — Ambulatory Visit (INDEPENDENT_AMBULATORY_CARE_PROVIDER_SITE_OTHER): Payer: Medicare Other | Admitting: Nurse Practitioner

## 2021-06-09 DIAGNOSIS — Z6841 Body Mass Index (BMI) 40.0 and over, adult: Secondary | ICD-10-CM | POA: Diagnosis not present

## 2021-06-09 DIAGNOSIS — E7800 Pure hypercholesterolemia, unspecified: Secondary | ICD-10-CM | POA: Diagnosis not present

## 2021-06-09 DIAGNOSIS — I10 Essential (primary) hypertension: Secondary | ICD-10-CM

## 2021-06-09 DIAGNOSIS — E559 Vitamin D deficiency, unspecified: Secondary | ICD-10-CM

## 2021-06-09 DIAGNOSIS — Z0000 Encounter for general adult medical examination without abnormal findings: Secondary | ICD-10-CM

## 2021-06-09 DIAGNOSIS — E039 Hypothyroidism, unspecified: Secondary | ICD-10-CM

## 2021-06-09 LAB — MICROALBUMIN, URINE WAIVED
Creatinine, Urine Waived: 50 mg/dL (ref 10–300)
Microalb, Ur Waived: 30 mg/L — ABNORMAL HIGH (ref 0–19)

## 2021-06-09 MED ORDER — LEVOTHYROXINE SODIUM 88 MCG PO TABS: 88.0000 ug | ORAL_TABLET | Freq: Every day | ORAL | 4 refills | Status: DC

## 2021-06-09 MED ORDER — CALCITRIOL 0.5 MCG PO CAPS: 0.5000 ug | ORAL_CAPSULE | Freq: Two times a day (BID) | ORAL | 4 refills | Status: DC

## 2021-06-09 MED ORDER — OLMESARTAN-AMLODIPINE-HCTZ 40-5-12.5 MG PO TABS: ORAL_TABLET | ORAL | 4 refills | Status: DC

## 2021-06-09 NOTE — Assessment & Plan Note (Signed)
Ongoing, taking daily supplement, continue this and check level today. 

## 2021-06-09 NOTE — Assessment & Plan Note (Signed)
Chronic, ongoing.  Continue current medication regimen and adjust as needed.  TSH, Free T4, and thyroid antibody today.  Return in 6 months.   ?

## 2021-06-09 NOTE — Assessment & Plan Note (Signed)
Noted on labs recently with ASCVD 11.7%.  Continue diet focus and recheck fasting labs today.  She prefers not to start statin, but have had at length discussion with her recommendation to start one if levels remain high and ASCVD >10%. ?

## 2021-06-09 NOTE — Assessment & Plan Note (Signed)
BMI 48.06.  Recommended eating smaller high protein, low fat meals more frequently and exercising 30 mins a day 5 times a week with a goal of 10-15lb weight loss in the next 3 months. Patient voiced their understanding and motivation to adhere to these recommendations. ? ?

## 2021-06-09 NOTE — Assessment & Plan Note (Signed)
Ongoing, continue supplement -- check CMP, PTH, Vit D today. 

## 2021-06-09 NOTE — Progress Notes (Signed)
? ?BP 136/85   Pulse 94   Temp 98.3 ?F (36.8 ?C) (Oral)   Ht 5' 4.5" (1.638 m)   Wt 284 lb 6.4 oz (129 kg)   SpO2 97%   BMI 48.06 kg/m?   ? ?Subjective:  ? ? Patient ID: Kerry Phillips Fidalgo, female    DOB: [birth year], 66 y.o.   MRN: [id number] ? ?HPI: ?Kerry Phillips Haupert is a 66 y.o. female presenting on 06/09/2021 for annual medical examination. Current medical complaints include:none ? ?She currently lives with: self ?Menopausal Symptoms: no  ? ?HYPERTENSION ?Taking Olmesartan-Amlodipine- HCTZ daily.  Calcium levels in 8 range, continues on supplements. ?Hypertension status: controlled  ?Satisfied with current treatment? yes ?Duration of hypertension: chronic ?BP monitoring frequency:  not checking ?BP range:  ?BP medication side effects:  no ?Medication compliance: good compliance ?Aspirin: no ?Recurrent headaches: no ?Visual changes: no ?Palpitations: no ?Dyspnea: no ?Chest pain: no ?Lower extremity edema: no ?Dizzy/lightheaded: no  ?ASCVD today is 11.7% ? ?HYPOTHYROIDISM ?Taking Levothyroxine 88 MCG daily. ?Thyroid control status:stable ?Satisfied with current treatment? yes ?Medication side effects: no ?Medication compliance: good compliance ?Etiology of hypothyroidism:  ?Recent dose adjustment:no ?Fatigue: no ?Cold intolerance: no ?Heat intolerance: no ?Weight gain: no ?Weight loss: no ?Constipation: no ?Diarrhea/loose stools: no ?Palpitations: no ?Lower extremity edema: no ?Anxiety/depressed mood: no ? ?Depression Screen done today and results listed below:  ? ?  06/09/2021  ? 10:43 AM 02/15/2021  ? 10:02 AM 04/16/2020  ? 11:25 AM 01/01/2020  ?  9:25 AM 12/09/2019  ?  9:52 AM  ?Depression screen PHQ 2/9  ?Decreased Interest 0 0 0 0 0  ?Down, Depressed, Hopeless 0 0 1 0 0  ?PHQ - 2 Score 0 0 1 0 0  ?Altered sleeping 1  0    ?Tired, decreased energy 1  0    ?Change in appetite 1  0    ?Feeling bad or failure about yourself  0  0    ?Trouble concentrating 0  0    ?Moving slowly or fidgety/restless 0  0    ?Suicidal  thoughts 0  0    ?PHQ-9 Score 3  1    ? ? ?The patient does not have a history of falls. I did not complete a risk assessment for falls. A plan of care for falls was not documented. ? ? ?Past Medical History:  ?Past Medical History:  ?Diagnosis Date  ? Arthritis   ? Dysphagia 01/17/2017  ? History of blood transfusion   ? with childbirth  ? Hypothyroidism   ? surgical  ? ? ?Surgical History:  ?Past Surgical History:  ?Procedure Laterality Date  ? ABDOMINAL HYSTERECTOMY    ? ARTHROSCOPIC HAGLUNDS REPAIR Right 01/04/2019  ? Procedure: ENDOSCOPIC RESECTION HAGLUNDS DEFORMITY;  Surgeon: Nadara Mustarduda, Marcus V, MD;  Location[facility] OR;  Service: Orthopedics;  Laterality: Right;  ? COLONOSCOPY    ? THYROIDECTOMY    ? 90% removed  ? ? ?Medications:  ?Current Outpatient Medications on File Prior to Visit  ?Medication Sig  ? Iron-Vitamins (GERITOL TONIC PO) Take 5 mLs by mouth daily.  ? ?No current facility-administered medications on file prior to visit.  ? ? ?Allergies:  ?No Known Allergies ? ?Social History:  ?Social History  ? ?Socioeconomic History  ? Marital status: Married  ?  Spouse name: Not on file  ? Number of children: Not on file  ? Years of education: Not on file  ? Highest education level: Not on file  ?Occupational History  ?  Not on file  ?Tobacco Use  ? Smoking status: Never  ? Smokeless tobacco: Never  ?Vaping Use  ? Vaping Use: Never used  ?Substance and Sexual Activity  ? Alcohol use: No  ? Drug use: Never  ? Sexual activity: Yes  ?Other Topics Concern  ? Not on file  ?Social History Narrative  ? Not on file  ? ?Social Determinants of Health  ? ?Financial Resource Strain: Low Risk   ? Difficulty of Paying Living Expenses: Not hard at all  ?Food Insecurity: No Food Insecurity  ? Worried About Programme researcher, broadcasting/film/video in the Last Year: Never true  ? Ran Out of Food in the Last Year: Never true  ?Transportation Needs: No Transportation Needs  ? Lack of Transportation (Medical): No  ? Lack of Transportation (Non-Medical): No   ?Physical Activity: Insufficiently Active  ? Days of Exercise per Week: 3 days  ? Minutes of Exercise per Session: 30 min  ?Stress: No Stress Concern Present  ? Feeling of Stress : Not at all  ?Social Connections: Socially Integrated  ? Frequency of Communication with Friends and Family: More than three times a week  ? Frequency of Social Gatherings with Friends and Family: Three times a week  ? Attends Religious Services: More than 4 times per year  ? Active Member of Clubs or Organizations: Yes  ? Attends Banker Meetings: More than 4 times per year  ? Marital Status: Married  ?Intimate Partner Violence: Not At Risk  ? Fear of Current or Ex-Partner: No  ? Emotionally Abused: No  ? Physically Abused: No  ? Sexually Abused: No  ? ?Social History  ? ?Tobacco Use  ?Smoking Status Never  ?Smokeless Tobacco Never  ? ?Social History  ? ?Substance and Sexual Activity  ?Alcohol Use No  ? ? ?Family History:  ?Family History  ?Problem Relation Age of Onset  ? Hypertension Mother   ? Cancer Father   ?     Lung  ? Stroke Sister   ? Hypertension Sister   ? Hypertension Daughter   ? Hypertension Son   ? Lupus Sister   ? Breast cancer Sister   ? ? ?Past medical history, surgical history, medications, allergies, family history and social history reviewed with patient today and changes made to appropriate areas of the chart.  ? ?ROS ?All other ROS negative except what is listed above and in the HPI.  ? ?   ?Objective:  ?  ?BP 136/85   Pulse 94   Temp 98.3 ?F (36.8 ?C) (Oral)   Ht 5' 4.5" (1.638 m)   Wt 284 lb 6.4 oz (129 kg)   SpO2 97%   BMI 48.06 kg/m?   ?Wt Readings from Last 3 Encounters:  ?06/09/21 284 lb 6.4 oz (129 kg)  ?10/15/20 284 lb 12.8 oz (129.2 kg)  ?04/16/20 288 lb (130.6 kg)  ?  ?Physical Exam ?Vitals and nursing note reviewed. Exam conducted with a chaperone present.  ?Constitutional:   ?   General: She is awake. She is not in acute distress. ?   Appearance: She is well-developed and  well-groomed. She is obese. She is not ill-appearing or toxic-appearing.  ?HENT:  ?   Head: Normocephalic and atraumatic.  ?   Right Ear: Hearing, tympanic membrane, ear canal and external ear normal. No drainage.  ?   Left Ear: Hearing, tympanic membrane, ear canal and external ear normal. No drainage.  ?   Nose: Nose normal.  ?  Right Sinus: No maxillary sinus tenderness or frontal sinus tenderness.  ?   Left Sinus: No maxillary sinus tenderness or frontal sinus tenderness.  ?   Mouth/Throat:  ?   Mouth: Mucous membranes are moist.  ?   Pharynx: Oropharynx is clear. Uvula midline. No pharyngeal swelling, oropharyngeal exudate or posterior oropharyngeal erythema.  ?Eyes:  ?   General: Lids are normal.     ?   Right eye: No discharge.     ?   Left eye: No discharge.  ?   Extraocular Movements: Extraocular movements intact.  ?   Conjunctiva/sclera: Conjunctivae normal.  ?   Pupils: Pupils are equal, round, and reactive to light.  ?   Visual Fields: Right eye visual fields normal and left eye visual fields normal.  ?Neck:  ?   Thyroid: No thyromegaly.  ?   Vascular: No carotid bruit.  ?   Trachea: Trachea normal.  ?Cardiovascular:  ?   Rate and Rhythm: Normal rate and regular rhythm.  ?   Heart sounds: Normal heart sounds. No murmur heard. ?  No gallop.  ?Pulmonary:  ?   Effort: Pulmonary effort is normal. No accessory muscle usage or respiratory distress.  ?   Breath sounds: Normal breath sounds.  ?Chest:  ?Breasts: ?   Right: Normal.  ?   Left: Normal.  ?Abdominal:  ?   General: Bowel sounds are normal.  ?   Palpations: Abdomen is soft. There is no hepatomegaly or splenomegaly.  ?   Tenderness: There is no abdominal tenderness.  ?Musculoskeletal:     ?   General: Normal range of motion.  ?   Cervical back: Normal range of motion and neck supple.  ?   Right lower leg: No edema.  ?   Left lower leg: No edema.  ?Lymphadenopathy:  ?   Head:  ?   Right side of head: No submental, submandibular, tonsillar, preauricular  or posterior auricular adenopathy.  ?   Left side of head: No submental, submandibular, tonsillar, preauricular or posterior auricular adenopathy.  ?   Cervical: No cervical adenopathy.  ?   Upper Body:  ?   Right upper body: No s

## 2021-06-09 NOTE — Assessment & Plan Note (Addendum)
Chronic, stable with BP at goal.  Recommend she monitor BP at least a few mornings a week at home and document.  DASH diet at home.  Continue current medication regimen and adjust as needed.  Urine ALB 10, continue Olmesartan for kidney protection.  Labs today: CMP, CBC, TSH, urine ALB.  Refills sent.  Return in 6 months. ? ?

## 2021-06-09 NOTE — Assessment & Plan Note (Signed)
BMI 48.06 with HTN and HLD.  Recommended eating smaller high protein, low fat meals more frequently and exercising 30 mins a day 5 times a week with a goal of 10-15lb weight loss in the next 3 months. Patient voiced their understanding and motivation to adhere to these recommendations. ? ?

## 2021-06-10 ENCOUNTER — Other Ambulatory Visit: Payer: Self-pay | Admitting: Nurse Practitioner

## 2021-06-10 ENCOUNTER — Encounter: Payer: Self-pay | Admitting: Nurse Practitioner

## 2021-06-10 ENCOUNTER — Ambulatory Visit (INDEPENDENT_AMBULATORY_CARE_PROVIDER_SITE_OTHER): Payer: Medicare Other | Admitting: Orthopedic Surgery

## 2021-06-10 DIAGNOSIS — M1711 Unilateral primary osteoarthritis, right knee: Secondary | ICD-10-CM | POA: Diagnosis not present

## 2021-06-10 DIAGNOSIS — E063 Autoimmune thyroiditis: Secondary | ICD-10-CM

## 2021-06-10 DIAGNOSIS — I10 Essential (primary) hypertension: Secondary | ICD-10-CM

## 2021-06-10 DIAGNOSIS — M1712 Unilateral primary osteoarthritis, left knee: Secondary | ICD-10-CM | POA: Diagnosis not present

## 2021-06-10 MED ORDER — LEVOTHYROXINE SODIUM 100 MCG PO TABS: 100.0000 ug | ORAL_TABLET | Freq: Every day | ORAL | 3 refills | Status: DC

## 2021-06-10 NOTE — Progress Notes (Signed)
Good morning crew, please let Dailah know labs have returned with exception of parathyroid lab -- will alert you if abnormal: ?- CBC shows no anemia or infection ?- Kidney function is showing some mild decline this check, please increase water intake and add a little lemon to this.  Avoid Ibuprofen products.  Would like to recheck on outpatient labs in 6 weeks.  Liver function normal. ?- Vitamin D level is on low side, please ensure you are taking Vitamin D3 1000 units daily.  Are you doing this? ?- Thyroid labs show mild elevation in TSH, but normal Free T4.  Thyroid antibody is elevated, showing this is most likely more Hashimoto's thyroid disease.  We treat this same as we are now.  I would like to increase your Levothyroxine to 100 MCG and recheck lab in 6 weeks.  I will send this in.  Stop 88 MCG dosing. ?- Cholesterol labs remain elevated and have trended up a bit.  I do recommend starting statin to help reduce your heart risks, but if you wish to focus on diet & exercise for the next 6 months let me know.  Any questions?  Please schedule lab only visit for 6 weeks. ?Keep being stellar!!  Thank you for allowing me to participate in your care.  I appreciate you. ?Kindest regards, ?Hakeen Shipes ?

## 2021-06-11 ENCOUNTER — Ambulatory Visit: Payer: Self-pay | Admitting: *Deleted

## 2021-06-11 LAB — CBC WITH DIFFERENTIAL/PLATELET
Basophils Absolute: 0.1 10*3/uL (ref 0.0–0.2)
Basos: 1 %
EOS (ABSOLUTE): 0.2 10*3/uL (ref 0.0–0.4)
Eos: 3 %
Hematocrit: 38.3 % (ref 34.0–46.6)
Hemoglobin: 12.7 g/dL (ref 11.1–15.9)
Immature Grans (Abs): 0 10*3/uL (ref 0.0–0.1)
Immature Granulocytes: 0 %
Lymphocytes Absolute: 1.5 10*3/uL (ref 0.7–3.1)
Lymphs: 23 %
MCH: 28.3 pg (ref 26.6–33.0)
MCHC: 33.2 g/dL (ref 31.5–35.7)
MCV: 85 fL (ref 79–97)
Monocytes Absolute: 0.4 10*3/uL (ref 0.1–0.9)
Monocytes: 6 %
Neutrophils Absolute: 4.4 10*3/uL (ref 1.4–7.0)
Neutrophils: 67 %
Platelets: 349 10*3/uL (ref 150–450)
RBC: 4.49 x10E6/uL (ref 3.77–5.28)
RDW: 14.5 % (ref 11.7–15.4)
WBC: 6.6 10*3/uL (ref 3.4–10.8)

## 2021-06-11 LAB — VITAMIN D 25 HYDROXY (VIT D DEFICIENCY, FRACTURES): Vit D, 25-Hydroxy: 13.9 ng/mL — ABNORMAL LOW (ref 30.0–100.0)

## 2021-06-11 LAB — COMPREHENSIVE METABOLIC PANEL
ALT: 9 IU/L (ref 0–32)
AST: 16 IU/L (ref 0–40)
Albumin/Globulin Ratio: 1.7 (ref 1.2–2.2)
Albumin: 4.7 g/dL (ref 3.8–4.8)
Alkaline Phosphatase: 55 IU/L (ref 44–121)
BUN/Creatinine Ratio: 16 (ref 12–28)
BUN: 17 mg/dL (ref 8–27)
Bilirubin Total: 0.4 mg/dL (ref 0.0–1.2)
CO2: 25 mmol/L (ref 20–29)
Calcium: 9.2 mg/dL (ref 8.7–10.3)
Chloride: 96 mmol/L (ref 96–106)
Creatinine, Ser: 1.04 mg/dL — ABNORMAL HIGH (ref 0.57–1.00)
Globulin, Total: 2.7 g/dL (ref 1.5–4.5)
Glucose: 81 mg/dL (ref 70–99)
Potassium: 4.2 mmol/L (ref 3.5–5.2)
Sodium: 141 mmol/L (ref 134–144)
Total Protein: 7.4 g/dL (ref 6.0–8.5)
eGFR: 59 mL/min/{1.73_m2} — ABNORMAL LOW (ref >59)

## 2021-06-11 LAB — LIPID PANEL W/O CHOL/HDL RATIO
Cholesterol, Total: 237 mg/dL — ABNORMAL HIGH (ref 100–199)
HDL: 63 mg/dL (ref >39)
LDL Chol Calc (NIH): 159 mg/dL — ABNORMAL HIGH (ref 0–99)
Triglycerides: 85 mg/dL (ref 0–149)
VLDL Cholesterol Cal: 15 mg/dL (ref 5–40)

## 2021-06-11 LAB — PTH, INTACT AND CALCIUM: PTH: 17 pg/mL (ref 15–65)

## 2021-06-11 LAB — THYROID PEROXIDASE ANTIBODY: Thyroperoxidase Ab SerPl-aCnc: 108 IU/mL — ABNORMAL HIGH (ref 0–34)

## 2021-06-11 LAB — TSH: TSH: 4.73 u[IU]/mL — ABNORMAL HIGH (ref 0.450–4.500)

## 2021-06-11 LAB — T4, FREE: Free T4: 1.46 ng/dL (ref 0.82–1.77)

## 2021-06-11 NOTE — Telephone Encounter (Signed)
Patient made aware of results and verbalized understanding.  

## 2021-06-11 NOTE — Telephone Encounter (Signed)
Patient requesting recent lab results. No documentation from provider regarding results at this time. Please advise. Patient requesting a call back. ?

## 2021-06-20 ENCOUNTER — Encounter: Payer: Self-pay | Admitting: Orthopedic Surgery

## 2021-06-20 NOTE — Progress Notes (Signed)
? ?Office Visit Note ?  ?Patient: Kerry Phillips           ?Date of Birth: Nov 04, 1955           ?MRN: [id number] ?Visit Date: 06/10/2021 ?             ?Requested by: Venita Lick, NP ?[address] ?[place],  [place] [address] ?PCP: Venita Lick, NP ? ?Chief Complaint  ?Patient presents with  ? Right Knee - Pain, Follow-up  ? Left Knee - Pain, Follow-up  ? ? ? ? ?HPI: ?Patient is a 66 year old woman who presents in follow-up for bilateral knee pain.  She is status post steroid injection for both knees 1 month ago.  She states she is doing much better after the injection. ? ?Assessment & Plan: ?Visit Diagnoses:  ?1. Unilateral primary osteoarthritis, left knee   ?2. Unilateral primary osteoarthritis, right knee   ? ? ?Plan: Increase activities as tolerated recommended strengthening and exercise. ? ?Follow-Up Instructions: Return if symptoms worsen or fail to improve.  ? ?Ortho Exam ? ?Patient is alert, oriented, no adenopathy, well-dressed, normal affect, normal respiratory effort. ?Examination both knees have crepitation with range of motion of the patellofemoral joint.  Medial lateral joint lines are nontender to palpation there is no effusion collaterals and cruciates are stable. ? ?Imaging: ?No results found. ?No images are attached to the encounter. ? ?Labs: ?Lab Results  ?Component Value Date  ? HGBA1C 4.8 01/01/2020  ? ? ? ?Lab Results  ?Component Value Date  ? ALBUMIN 4.7 06/09/2021  ? ALBUMIN 4.2 04/16/2020  ? ALBUMIN 4.4 12/09/2019  ? ? ?Lab Results  ?Component Value Date  ? MG 1.9 04/29/2020  ? ?Lab Results  ?Component Value Date  ? VD25OH 13.9 (L) 06/09/2021  ? VD25OH 20.1 (L) 10/15/2020  ? VD25OH 24.5 (L) 04/29/2020  ? ? ?No results found for: PREALBUMIN ? ?  Latest Ref Rng & Units 06/09/2021  ? 11:14 AM 04/16/2020  ? 11:50 AM 12/09/2019  ? 10:17 AM  ?CBC EXTENDED  ?WBC 3.4 - 10.8 x10E3/uL 6.6   7.2   6.3    ?RBC 3.77 - 5.28 x10E6/uL 4.49   4.34   4.48    ?Hemoglobin 11.1 - 15.9 g/dL 12.7   11.7   12.5     ?HCT 34.0 - 46.6 % 38.3   36.8   38.4    ?Platelets 150 - 450 x10E3/uL 349   399   338    ?NEUT# 1.4 - 7.0 x10E3/uL 4.4   5.1   4.5    ?Lymph# 0.7 - 3.1 x10E3/uL 1.5   1.4   1.2    ? ? ? ?There is no height or weight on file to calculate BMI. ? ?Orders:  ?No orders of the defined types were placed in this encounter. ? ?No orders of the defined types were placed in this encounter. ? ? ? Procedures: ?No procedures performed ? ?Clinical Data: ?No additional findings. ? ?ROS: ? ?All other systems negative, except as noted in the HPI. ?Review of Systems ? ?Objective: ?Vital Signs: There were no vitals taken for this visit. ? ?Specialty Comments:  ?No specialty comments available. ? ?PMFS History: ?Patient Active Problem List  ? Diagnosis Date Noted  ? Elevated LDL cholesterol level 10/15/2020  ? Vitamin D deficiency 10/15/2020  ? Morbid obesity ([place]) 03/22/2018  ? Hypocalcemia 09/07/2017  ? BMI 45.0-49.9, adult ([place]) 04/05/2016  ? Arthritis of both knees 04/05/2016  ? Essential  hypertension 12/22/2014  ? Hashimoto's thyroiditis 12/22/2014  ? ?Past Medical History:  ?Diagnosis Date  ? Arthritis   ? Dysphagia 01/17/2017  ? History of blood transfusion   ? with childbirth  ? Hypothyroidism   ? surgical  ?  ?Family History  ?Problem Relation Age of Onset  ? Hypertension Mother   ? Cancer Father   ?     Lung  ? Stroke Sister   ? Hypertension Sister   ? Hypertension Daughter   ? Hypertension Son   ? Lupus Sister   ? Breast cancer Sister   ?  ?Past Surgical History:  ?Procedure Laterality Date  ? ABDOMINAL HYSTERECTOMY    ? ARTHROSCOPIC HAGLUNDS REPAIR Right 01/04/2019  ? Procedure: ENDOSCOPIC RESECTION HAGLUNDS DEFORMITY;  Surgeon: Newt Minion, MD;  Location: [facility];  Service: Orthopedics;  Laterality: Right;  ? COLONOSCOPY    ? THYROIDECTOMY    ? 90% removed  ? ?Social History  ? ?Occupational History  ? Not on file  ?Tobacco Use  ? Smoking status: Never  ? Smokeless tobacco: Never  ?Vaping Use  ? Vaping Use: Never used   ?Substance and Sexual Activity  ? Alcohol use: No  ? Drug use: Never  ? Sexual activity: Yes  ? ? ? ? ? ?

## 2021-07-11 ENCOUNTER — Other Ambulatory Visit: Payer: Self-pay | Admitting: Nurse Practitioner

## 2021-07-11 DIAGNOSIS — E039 Hypothyroidism, unspecified: Secondary | ICD-10-CM

## 2021-07-13 NOTE — Telephone Encounter (Signed)
Patient no longer on dosing- now on dose ?Requested Prescriptions  ?Pending Prescriptions Disp Refills  ?? levothyroxine (SYNTHROID) 88 MCG tablet [Pharmacy Med Name: LEVOTHYROXINE 88 MCG TABLET] 90 tablet 4  ?  Sig: TAKE 1 TABLET BY MOUTH DAILY BEFORE BREAKFAST.  ?  ? Endocrinology:  Hypothyroid Agents Failed - 07/11/2021  9:32 AM  ?  ?  Failed - TSH in normal range and within 360 days  ?  TSH  ?Date Value Ref Range Status  ?06/09/2021 4.730 (H) 0.450 - 4.500 uIU/mL Final  ?   ?  ?  Passed - Valid encounter within last 12 months  ?  Recent Outpatient Visits   ?      ? 1 month ago Morbid obesity (HCC)  ? [facility] [place], [place] T, NP  ? 9 months ago Morbid obesity (HCC)  ? [facility] [place], Corrie Dandy T, NP  ? 1 year ago Morbid obesity (HCC)  ? [facility] [place], Corrie Dandy T, NP  ? 1 year ago Postmenopausal  ? [facility] Caro [place], DO  ? 1 year ago Essential hypertension  ? [facility] Valentino Nose, NP  ?  ?  ?Future Appointments   ?        ? In 4 months [place], Dorie Rank, NP [facility]  ?  ? ?  ?  ?  ? ?

## 2021-07-23 ENCOUNTER — Other Ambulatory Visit: Payer: Medicare Other

## 2021-07-23 DIAGNOSIS — E063 Autoimmune thyroiditis: Secondary | ICD-10-CM

## 2021-07-23 DIAGNOSIS — I10 Essential (primary) hypertension: Secondary | ICD-10-CM

## 2021-07-24 LAB — BASIC METABOLIC PANEL
BUN/Creatinine Ratio: 14 (ref 12–28)
BUN: 17 mg/dL (ref 8–27)
CO2: 25 mmol/L (ref 20–29)
Calcium: 9 mg/dL (ref 8.7–10.3)
Chloride: 101 mmol/L (ref 96–106)
Creatinine, Ser: 1.24 mg/dL — ABNORMAL HIGH (ref 0.57–1.00)
Glucose: 82 mg/dL (ref 70–99)
Potassium: 3.9 mmol/L (ref 3.5–5.2)
Sodium: 141 mmol/L (ref 134–144)
eGFR: 48 mL/min/{1.73_m2} — ABNORMAL LOW (ref >59)

## 2021-07-24 LAB — TSH: TSH: 1.47 u[IU]/mL (ref 0.450–4.500)

## 2021-07-24 LAB — T4, FREE: Free T4: 1.5 ng/dL (ref 0.82–1.77)

## 2021-07-25 ENCOUNTER — Encounter: Payer: Self-pay | Admitting: Nurse Practitioner

## 2021-07-25 DIAGNOSIS — N1831 Chronic kidney disease, stage 3a: Secondary | ICD-10-CM | POA: Insufficient documentation

## 2021-07-25 NOTE — Progress Notes (Signed)
Good morning crew, please let Glennice know her labs have returned: - Kidney function continues to show some mild kidney disease stage 3a.  Continue your blood pressure medication, as it has Olmesartan in it which is protective to kidneys. - Thyroid levels improved, continue current dosing Levothyroxine.  Any questions? Keep being wonderful!!  Thank you for allowing me to participate in your care.  I appreciate you. Kindest regards, [place] Espinoza

## 2021-08-02 NOTE — Progress Notes (Signed)
Patient called office and has been notified of lab results and provider recommendations.

## 2021-11-09 ENCOUNTER — Other Ambulatory Visit: Payer: Self-pay | Admitting: Nurse Practitioner

## 2021-11-09 DIAGNOSIS — Z1231 Encounter for screening mammogram for malignant neoplasm of breast: Secondary | ICD-10-CM

## 2021-11-30 ENCOUNTER — Ambulatory Visit
Admission: RE | Admit: 2021-11-30 | Discharge: 2021-11-30 | Disposition: A | Discharge status: Home / Self Care | Payer: Medicare Other | Source: Ambulatory Visit | Attending: Nurse Practitioner | Admitting: Nurse Practitioner

## 2021-11-30 DIAGNOSIS — Z1231 Encounter for screening mammogram for malignant neoplasm of breast: Secondary | ICD-10-CM

## 2021-12-01 NOTE — Progress Notes (Signed)
Contacted via MyChart   Normal mammogram, may repeat in one year:)

## 2021-12-09 ENCOUNTER — Ambulatory Visit: Payer: Medicare Other | Admitting: Nurse Practitioner

## 2021-12-09 DIAGNOSIS — Z6841 Body Mass Index (BMI) 40.0 and over, adult: Secondary | ICD-10-CM

## 2021-12-09 DIAGNOSIS — I10 Essential (primary) hypertension: Secondary | ICD-10-CM

## 2021-12-09 DIAGNOSIS — E7800 Pure hypercholesterolemia, unspecified: Secondary | ICD-10-CM

## 2021-12-09 DIAGNOSIS — N1831 Chronic kidney disease, stage 3a: Secondary | ICD-10-CM

## 2021-12-09 DIAGNOSIS — E559 Vitamin D deficiency, unspecified: Secondary | ICD-10-CM

## 2021-12-09 DIAGNOSIS — E063 Autoimmune thyroiditis: Secondary | ICD-10-CM

## 2022-01-02 NOTE — Patient Instructions (Signed)
Food Basics for Chronic Kidney Disease Chronic kidney disease (CKD) is when your kidneys are not working well. They cannot remove waste, fluids, and other substances from your blood the way they should. These substances can build up, which can worsen kidney damage and affect how your body works. Eating certain foods can lead to a buildup of these substances. Changing your diet can help prevent more kidney damage. Diet changes may also delay dialysis or even keep you from needing it. What nutrients should I limit? Work with your treatment team and a food expert (dietitian) to make a meal plan that's right for you. Foods you can eat and foods you should limit or avoid will depend on the stage of your kidney disease and any other health conditions you have. The items listed below are not a complete list. Talk with your dietitian to learn what is best for you. Potassium Potassium affects how steadily your heart beats. Too much potassium in your blood can cause an irregular heartbeat or even a heart attack. You may need to limit foods that are high in potassium, such as: Liquid milk and soy milk. Salt substitutes that contain potassium. Fruits like bananas, apricots, nectarines, melon, prunes, raisins, kiwi, and oranges. Vegetables, such as potatoes, sweet potatoes, yams, tomatoes, leafy greens, beets, avocado, pumpkin, and winter squash. Beans, like lima beans. Nuts. Phosphorus Phosphorus is a mineral found in your bones. You need a balance between calcium and phosphorus to build and maintain healthy bones. Too much added phosphorus from the foods you eat can pull calcium from your bones. Losing calcium can make your bones weak and more likely to break. Too much phosphorus can also make your skin itch. You may need to limit foods that are high in phosphorus or that have added phosphorus, such as: Liquid milk and dairy products. Dark-colored sodas or soft drinks. Bran cereals and  oatmeal. Protein  Protein helps you make and keep muscle. Protein also helps to repair your body's cells and tissues. One of the natural breakdown products of protein is a waste product called urea. When your kidneys are not working well, they cannot remove types of waste like urea. Reducing protein in your diet can help keep urea from building up in your blood. Depending on your stage of kidney disease, you may need to eat smaller portions of foods that are high in protein. Sources of animal protein include: Meat (all types). Fish and seafood. Poultry. Eggs. Dairy. Other protein foods include: Beans and legumes. Nuts and nut butter. Soy, like tofu.  Sodium Salt (sodium) helps to keep a healthy balance of fluids in your body. Too much salt can increase your blood pressure, which can harm your heart and lungs. Extra salt can also cause your body to keep too much fluid, making your kidneys work harder. You may need to limit or avoid foods that are high in salt, such as: Salt seasonings. Soy and teriyaki sauce. Packaged, precooked, cured, or processed meats, such as sausages or meat loaves. Sardines. Salted crackers and snack foods. Fast food. Canned soups and most canned foods. Pickled foods. Vegetable juice. Boxed mixes or ready-to-eat boxed meals and side dishes. Bottled dressings, sauces, and marinades. Talk with your dietitian about how much potassium, phosphorus, protein, and salt you may have each day. Helpful tips Read food labels  Check the amount of salt in foods. Limit foods that have salt or sodium listed among the first five ingredients. Try to eat low-salt foods. Check the ingredient list   for added phosphorus or potassium. "Phos" in an ingredient is a sign that phosphorus has been added. Do not buy foods that are calcium-enriched or that have calcium added to them (are fortified). Buy canned vegetables and beans that say "no salt added" and rinse them before  eating. Lifestyle Limit the amount of protein you eat from animal sources each day. Focus on protein from plant sources, like tofu and dried beans, peas, and lentils. Do not add salt to food when cooking or before eating. Do not eat star fruit. It can be toxic for people with kidney problems. Talk with your health care provider before taking any vitamin or mineral supplements. If told by your health care provider, track how much liquid you drink so you can avoid drinking too much. You may need to include foods you eat that are made mostly from water, like gelatin, ice cream, soups, and juicy fruits and vegetables. If you have diabetes: If you have diabetes (diabetes mellitus) and CKD, you need to keep your blood sugar (glucose) in the target range recommended by your health care provider. Follow your diabetes management plan. This may include: Checking your blood glucose regularly. Taking medicines by mouth, or taking insulin, or both. Exercising for at least 30 minutes on 5 or more days each week, or as told by your health care provider. Tracking how many servings of carbohydrates you eat at each meal. Not using orange juice to treat low blood sugars. Instead, use apple juice, cranberry juice, or clear soda. You may be given guidelines on what foods and nutrients you may eat, and how much you can have each day. This depends on your stage of kidney disease and whether you have high blood pressure (hypertension). Follow the meal plan your dietitian gives you. To learn more: National Institute of Diabetes and Digestive and Kidney Diseases: niddk.nih.gov National Kidney Foundation: kidney.org Summary Chronic kidney disease (CKD) is when your kidneys are not working well. They cannot remove waste, fluids, and other substances from your blood the way they should. These substances can build up, which can worsen kidney damage and affect how your body works. Changing your diet can help prevent more  kidney damage. Diet changes may also delay dialysis or even keep you from needing it. Diet changes are different for each person with CKD. Work with a dietitian to set up a meal plan that is right for you. This information is not intended to replace advice given to you by your health care provider. Make sure you discuss any questions you have with your health care provider. Document Revised: 06/04/2021 Document Reviewed: 06/10/2019 Elsevier Patient Education  2023 Elsevier Inc.  

## 2022-01-06 ENCOUNTER — Encounter: Payer: Self-pay | Admitting: Nurse Practitioner

## 2022-01-06 ENCOUNTER — Ambulatory Visit (INDEPENDENT_AMBULATORY_CARE_PROVIDER_SITE_OTHER): Payer: Medicare Other | Admitting: Nurse Practitioner

## 2022-01-06 VITALS — BP 129/84 | HR 91 | Temp 98.7°F | Ht 64.5 in | Wt 287.1 lb

## 2022-01-06 DIAGNOSIS — J45990 Exercise induced bronchospasm: Secondary | ICD-10-CM | POA: Insufficient documentation

## 2022-01-06 DIAGNOSIS — N1831 Chronic kidney disease, stage 3a: Secondary | ICD-10-CM

## 2022-01-06 DIAGNOSIS — I10 Essential (primary) hypertension: Secondary | ICD-10-CM

## 2022-01-06 DIAGNOSIS — E559 Vitamin D deficiency, unspecified: Secondary | ICD-10-CM

## 2022-01-06 DIAGNOSIS — E063 Autoimmune thyroiditis: Secondary | ICD-10-CM

## 2022-01-06 DIAGNOSIS — Z6841 Body Mass Index (BMI) 40.0 and over, adult: Secondary | ICD-10-CM

## 2022-01-06 DIAGNOSIS — E7800 Pure hypercholesterolemia, unspecified: Secondary | ICD-10-CM

## 2022-01-06 MED ORDER — ALBUTEROL SULFATE HFA 108 (90 BASE) MCG/ACT IN AERS: 2.0000 | INHALATION_SPRAY | Freq: Four times a day (QID) | RESPIRATORY_TRACT | 4 refills | Status: DC | PRN

## 2022-01-06 NOTE — Assessment & Plan Note (Signed)
Chronic, ongoing.  Continue current medication regimen and adjust as needed.  Recent labs improved, plan on recheck at physical.  Return in 6 months.

## 2022-01-06 NOTE — Assessment & Plan Note (Signed)
Ongoing, taking daily supplement, continue this and check level today.

## 2022-01-06 NOTE — Assessment & Plan Note (Signed)
Noted on past two labs with urine ALB 10.  Continue Olmesartan for kidney protection.  Educated her on kidney disease and risks for this.  Monitor closely.  Increase water intake.  Consider nephrology if worsening.

## 2022-01-06 NOTE — Assessment & Plan Note (Signed)
BMI 48.52 with HTN and HLD.  Recommended eating smaller high protein, low fat meals more frequently and exercising 30 mins a day 5 times a week with a goal of 10-15lb weight loss in the next 3 months. Patient voiced their understanding and motivation to adhere to these recommendations.

## 2022-01-06 NOTE — Progress Notes (Signed)
BP 129/84   Pulse 91   Temp 98.7 F (37.1 C) (Oral)   Ht 5' 4.5" (1.638 m)   Wt 287 lb 1.6 oz (130.2 kg)   SpO2 98%   BMI 48.52 kg/m    Subjective:    Patient ID: Kerry Phillips, female    DOB: 11/09/1955, 66 y.o.   MRN: [id number]  HPI: Kerry Phillips is a 66 y.o. female  Chief Complaint  Patient presents with   Hyperlipidemia   Hypertension   Hashimoto's Thyroiditis   HYPERTENSION Taking Olmesartan-Amlodipine- HCTZ daily.  Calcium levels in 8-9 range, continues on supplements -- recent level was normal at 9.  Vitamin was low, is taking supplements. Normal DEXA on 05/29/20.   Has had some wheezing present for several months, comes and goes -- notices this more with activity.  Mostly with longer period of walking.  No history of asthma.  Does have underlying allergies.  Occasionally takes Claritin.  No recent URI and none present prior to wheezing. Hypertension status: controlled  Satisfied with current treatment? yes Duration of hypertension: chronic BP monitoring frequency:  rarely BP range: 120/80 range at home BP medication side effects:  no Medication compliance: good compliance Aspirin: no Recurrent headaches: no Visual changes: no Palpitations: no Dyspnea: no Chest pain: no Lower extremity edema: no Dizzy/lightheaded: no  The 10-year ASCVD risk score (Arnett DK, et al., 2019) is: 10.9%   Values used to calculate the score:     Age: 33 years     Sex: Female     Is Non-Hispanic African American: Yes     Diabetic: No     Tobacco smoker: No     Systolic Blood Pressure: 606 mmHg     Is BP treated: No     HDL Cholesterol: 63 mg/dL     Total Cholesterol: 237 mg/dL  CHRONIC KIDNEY DISEASE CKD status: stable Medications renally dose: yes Previous renal evaluation: no Pneumovax:   refuses Influenza Vaccine:   refuses    HYPOTHYROIDISM Taking Levothyroxine 100 MCG daily. Thyroid control status:stable Satisfied with current treatment? yes Medication side  effects: no Medication compliance: good compliance Etiology of hypothyroidism:  Recent dose adjustment: did adjust in April and repeat normal Fatigue: no Cold intolerance: no Heat intolerance: no Weight gain: no Weight loss: no Constipation: no Diarrhea/loose stools: no Palpitations: no Lower extremity edema: no Anxiety/depressed mood: no    01/06/2022   11:10 AM 06/09/2021   10:43 AM 02/15/2021   10:02 AM 04/16/2020   11:25 AM 01/01/2020    9:25 AM  Depression screen PHQ 2/9  Decreased Interest 0 0 0 0 0  Down, Depressed, Hopeless 1 0 0 1 0  PHQ - 2 Score 1 0 0 1 0  Altered sleeping 1 1  0   Tired, decreased energy 1 1  0   Change in appetite 0 1  0   Feeling bad or failure about yourself  1 0  0   Trouble concentrating  0  0   Moving slowly or fidgety/restless 0 0  0   Suicidal thoughts 0 0  0   PHQ-9 Score _0 Difficult doing work/chores Not difficult at all           01/06/2022   11:10 AM 06/09/2021   10:44 AM  GAD 7 : Generalized Anxiety Score  Nervous, Anxious, on Edge 1 0  Control/stop worrying 1 0  Worry too much - different things 1  0  Trouble relaxing 1 0  Restless 1 0  Easily annoyed or irritable 1 0  Afraid - awful might happen 1 0  Total GAD 7 Score 7 0  Anxiety Difficulty Not difficult at all Not difficult at all      Relevant past medical, surgical, family and social history reviewed and updated as indicated. Interim medical history since our last visit reviewed. Allergies and medications reviewed and updated.  Review of Systems  Constitutional:  Negative for activity change, appetite change, diaphoresis, fatigue and fever.  Respiratory:  Negative for cough, chest tightness and shortness of breath.   Cardiovascular:  Negative for chest pain, palpitations and leg swelling.  Endocrine: Negative for cold intolerance and heat intolerance.  Neurological: Negative.   Psychiatric/Behavioral: Negative.      Per HPI unless specifically indicated  above     Objective:    BP 129/84   Pulse 91   Temp 98.7 F (37.1 C) (Oral)   Ht 5' 4.5" (1.638 m)   Wt 287 lb 1.6 oz (130.2 kg)   SpO2 98%   BMI 48.52 kg/m   Wt Readings from Last 3 Encounters:  01/06/22 287 lb 1.6 oz (130.2 kg)  06/09/21 284 lb 6.4 oz (129 kg)  10/15/20 284 lb 12.8 oz (129.2 kg)    Physical Exam Vitals and nursing note reviewed.  Constitutional:      General: She is awake. She is not in acute distress.    Appearance: She is well-developed and well-groomed. She is obese. She is not ill-appearing or toxic-appearing.  HENT:     Head: Normocephalic.     Right Ear: Hearing normal.     Left Ear: Hearing normal.  Eyes:     General: Lids are normal.        Right eye: No discharge.        Left eye: No discharge.     Conjunctiva/sclera: Conjunctivae normal.     Pupils: Pupils are equal, round, and reactive to light.  Neck:     Thyroid: No thyromegaly.     Vascular: No carotid bruit.  Cardiovascular:     Rate and Rhythm: Normal rate and regular rhythm.     Heart sounds: Normal heart sounds. No murmur heard.    No gallop.  Pulmonary:     Effort: Pulmonary effort is normal. No accessory muscle usage or respiratory distress.     Breath sounds: Normal breath sounds.     Comments: Clear throughout, no wheezing noted. Abdominal:     General: Bowel sounds are normal.     Palpations: Abdomen is soft.  Musculoskeletal:     Cervical back: Normal range of motion and neck supple.     Right lower leg: No edema.     Left lower leg: No edema.  Lymphadenopathy:     Cervical: No cervical adenopathy.  Skin:    General: Skin is warm and dry.  Neurological:     Mental Status: She is alert and oriented to person, place, and time.  Psychiatric:        Attention and Perception: Attention normal.        Mood and Affect: Mood normal.        Speech: Speech normal.        Behavior: Behavior normal. Behavior is cooperative.        Thought Content: Thought content normal.     Results for orders placed or performed in visit on 46/96/29  Basic metabolic panel  Result  Value Ref Range   Glucose 82 70 - 99 mg/dL   BUN 17 8 - 27 mg/dL   Creatinine, Ser 1.24 (H) 0.57 - 1.00 mg/dL   eGFR 48 (L) >59 mL/min/1.73   BUN/Creatinine Ratio 14 12 - 28   Sodium 141 134 - 144 mmol/L   Potassium 3.9 3.5 - 5.2 mmol/L   Chloride 101 96 - 106 mmol/L   CO2 25 20 - 29 mmol/L   Calcium 9.0 8.7 - 10.3 mg/dL  TSH  Result Value Ref Range   TSH 1.470 0.450 - 4.500 uIU/mL  T4, free  Result Value Ref Range   Free T4 1.50 0.82 - 1.77 ng/dL      Assessment & Plan:   Problem List Items Addressed This Visit       Cardiovascular and Mediastinum   Essential hypertension    Chronic, stable.  BP at goal in office today.  Recommend she monitor BP at least a few mornings a week at home and document.  DASH diet at home.  Continue current medication regimen and adjust as needed.  Urine ALB 10, continue Olmesartan for kidney protection.  Labs today: CMP.  Return in 6 months.       Relevant Orders   Comprehensive metabolic panel     Respiratory   Exercise-induced asthma    Suspect some exercise induced asthma present.  Notices wheezing with exercise.  Continue to exercise as will benefit modest weight loss.  Start Albuterol inhaler and instructed on how to use, recommend she take 2 puffs prior to exercise.  If ongoing and no benefit, then will perform spirometry and consider maintenance inhaler.      Relevant Medications   albuterol (VENTOLIN HFA) 108 (90 Base) MCG/ACT inhaler     Endocrine   Hashimoto's thyroiditis    Chronic, ongoing.  Continue current medication regimen and adjust as needed.  Recent labs improved, plan on recheck at physical.  Return in 6 months.          Genitourinary   Stage 3a chronic kidney disease (CKD) ([place]) - Primary    Noted on past two labs with urine ALB 10.  Continue Olmesartan for kidney protection.  Educated her on kidney disease and risks for  this.  Monitor closely.  Increase water intake.  Consider nephrology if worsening.      Relevant Orders   Comprehensive metabolic panel   PTH, intact and calcium     Other   BMI 45.0-49.9, adult ([facility])    Refer to [facility] obesity plan of care.       Elevated LDL cholesterol level    Noted on labs recently with ASCVD 10.9%.  Continue diet focus and recheck fasting labs today.  She prefers not to start statin, but have had at length discussion with her recommendation to start one if levels remain high and ASCVD >10%.      Relevant Orders   Comprehensive metabolic panel   Lipid Panel w/o Chol/HDL Ratio   Hypocalcemia    Ongoing, continue supplement -- check CMP, PTH, Vit D today.      Relevant Orders   Comprehensive metabolic panel   Morbid obesity ([facility])    BMI 48.52 with HTN and HLD.  Recommended eating smaller high protein, low fat meals more frequently and exercising 30 mins a day 5 times a week with a goal of 10-15lb weight loss in the next 3 months. Patient voiced their understanding and motivation to adhere to these recommendations.  Vitamin D deficiency    Ongoing, taking daily supplement, continue this and check level today.      Relevant Orders   VITAMIN D 25 Hydroxy (Vit-D Deficiency, Fractures)     Follow up plan: Return in about 5 months (around 06/13/2022) for Annual physical after 06/10/22.

## 2022-01-06 NOTE — Assessment & Plan Note (Signed)
Suspect some exercise induced asthma present.  Notices wheezing with exercise.  Continue to exercise as will benefit modest weight loss.  Start Albuterol inhaler and instructed on how to use, recommend she take 2 puffs prior to exercise.  If ongoing and no benefit, then will perform spirometry and consider maintenance inhaler.

## 2022-01-06 NOTE — Assessment & Plan Note (Signed)
Noted on labs recently with ASCVD 10.9%.  Continue diet focus and recheck fasting labs today.  She prefers not to start statin, but have had at length discussion with her recommendation to start one if levels remain high and ASCVD >10%.

## 2022-01-06 NOTE — Assessment & Plan Note (Signed)
Ongoing, continue supplement -- check CMP, PTH, Vit D today.

## 2022-01-06 NOTE — Assessment & Plan Note (Signed)
Chronic, stable.  BP at goal in office today.  Recommend she monitor BP at least a few mornings a week at home and document.  DASH diet at home.  Continue current medication regimen and adjust as needed.  Urine ALB 10, continue Olmesartan for kidney protection.  Labs today: CMP.  Return in 6 months.

## 2022-01-06 NOTE — Assessment & Plan Note (Signed)
Refer to [facility] obesity plan of care.

## 2022-01-07 LAB — COMPREHENSIVE METABOLIC PANEL
ALT: 8 IU/L (ref 0–32)
AST: 13 IU/L (ref 0–40)
Albumin/Globulin Ratio: 2 (ref 1.2–2.2)
Albumin: 4.8 g/dL (ref 3.9–4.9)
Alkaline Phosphatase: 55 IU/L (ref 44–121)
BUN/Creatinine Ratio: 14 (ref 12–28)
BUN: 13 mg/dL (ref 8–27)
Bilirubin Total: 0.4 mg/dL (ref 0.0–1.2)
CO2: 23 mmol/L (ref 20–29)
Calcium: 8.5 mg/dL — ABNORMAL LOW (ref 8.7–10.3)
Chloride: 101 mmol/L (ref 96–106)
Creatinine, Ser: 0.96 mg/dL (ref 0.57–1.00)
Globulin, Total: 2.4 g/dL (ref 1.5–4.5)
Glucose: 81 mg/dL (ref 70–99)
Potassium: 4.2 mmol/L (ref 3.5–5.2)
Sodium: 140 mmol/L (ref 134–144)
Total Protein: 7.2 g/dL (ref 6.0–8.5)
eGFR: 65 mL/min/{1.73_m2} (ref >59)

## 2022-01-07 LAB — LIPID PANEL W/O CHOL/HDL RATIO
Cholesterol, Total: 232 mg/dL — ABNORMAL HIGH (ref 100–199)
HDL: 60 mg/dL (ref >39)
LDL Chol Calc (NIH): 154 mg/dL — ABNORMAL HIGH (ref 0–99)
Triglycerides: 103 mg/dL (ref 0–149)
VLDL Cholesterol Cal: 18 mg/dL (ref 5–40)

## 2022-01-07 LAB — PTH, INTACT AND CALCIUM: PTH: 24 pg/mL (ref 15–65)

## 2022-01-07 LAB — VITAMIN D 25 HYDROXY (VIT D DEFICIENCY, FRACTURES): Vit D, 25-Hydroxy: 14.9 ng/mL — ABNORMAL LOW (ref 30.0–100.0)

## 2022-01-07 NOTE — Progress Notes (Signed)
Contacted via [facility] The 10-year ASCVD risk score (Arnett DK, et al., 2019) is: 10.7%   Values used to calculate the score:     Age: 66 years     Sex: Female     Is Non-Hispanic African American: Yes     Diabetic: No     Tobacco smoker: No     Systolic Blood Pressure: 815 mmHg     Is BP treated: No     HDL Cholesterol: 60 mg/dL     Total Cholesterol: 232 mg/dL   Good afternoon Kerry Phillips, your labs have returned: - Kidney function, creatinine and eGFR, remains normal, as is liver function, AST and ALT.  - Calcium is a little low again and Vitamin D is low.  Are you taking your Calcitriol and a Vitamin D supplement 2000 units regularly -- every day?  Let me know.  Parathyroid normal. - Cholesterol labs remain elevated, I do continue to recommend a low dose statin for stroke and heart attack prevention -- if you would like to trial this, let me know.  Any questions? Keep being amazing!!  Thank you for allowing me to participate in your care.  I appreciate you. Kindest regards, Marjorie Deprey

## 2022-03-09 ENCOUNTER — Telehealth: Payer: Self-pay | Admitting: Nurse Practitioner

## 2022-03-09 NOTE — Telephone Encounter (Signed)
Copied from [place] [id number]. Topic: Medicare AWV >> Mar 09, 2022  1:56 PM Josephina Gip wrote: Reason for CRM: Left message for patient to call back and schedule the Medicare Annual Wellness Visit (AWV) virtually or by telephone.  Last AWV 02/15/21  Please schedule at anytime with [facility]Nurse Health Advisor.   Any questions, please call me at [phone]

## 2022-04-11 ENCOUNTER — Ambulatory Visit (INDEPENDENT_AMBULATORY_CARE_PROVIDER_SITE_OTHER): Payer: Medicare Other

## 2022-04-11 VITALS — Ht 64.5 in | Wt 287.0 lb

## 2022-04-11 DIAGNOSIS — Z0000 Encounter for general adult medical examination without abnormal findings: Secondary | ICD-10-CM | POA: Diagnosis not present

## 2022-04-11 NOTE — Progress Notes (Signed)
Virtual Visit via Telephone Note  I connected with  Kerry Phillips on 04/11/22 at  8:45 AM EST by telephone and verified that I am speaking with the correct person using two identifiers.  Location: Patient: home Provider: [facility] Persons participating in the virtual visit: patient/Nurse Health Advisor   I discussed the limitations, risks, security and privacy concerns of performing an evaluation and management service by telephone and the availability of in person appointments. The patient expressed understanding and agreed to proceed.  Interactive audio and video telecommunications were attempted between this nurse and patient, however failed, due to patient having technical difficulties OR patient did not have access to video capability.  We continued and completed visit with audio only.  Some vital signs may be absent or patient reported.   Dionisio David, LPN  Subjective:   Kerry Phillips is a 67 y.o. female who presents for Medicare Annual (Subsequent) preventive examination.  Review of Systems     Cardiac Risk Factors include: advanced age (>67mn, >>34women);obesity (BMI >30kg/m2);hypertension     Objective:    There were no vitals filed for this visit. There is no height or weight on file to calculate BMI.     04/11/2022    8:48 AM 02/15/2021    9:50 AM 01/04/2019    6:30 AM  Advanced Directives  Does Patient Have a Medical Advance Directive? No Yes No  Type of Advance Directive  [place] Chart?  No - copy requested   Would patient like information on creating a medical advance directive? No - Patient declined  No - Patient declined    Current Medications (verified) Outpatient Encounter Medications as of 04/11/2022  Medication Sig   calcitRIOL (ROCALTROL) 0.5 MCG capsule Take 1 capsule (0.5 mcg total) by mouth 2 (two) times daily.   Iron-Vitamins (GERITOL TONIC PO) Take 5 mLs by mouth daily.   levothyroxine  (SYNTHROID) 100 MCG tablet Take 1 tablet (100 mcg total) by mouth daily.   Olmesartan-amLODIPine-HCTZ 40-5-12.5 MG TABS TAKE 1 TABLET BY MOUTH ONCE DAILY   [DISCONTINUED] albuterol (VENTOLIN HFA) 108 (90 Base) MCG/ACT inhaler Inhale 2 puffs into the lungs every 6 (six) hours as needed for wheezing or shortness of breath.   No facility-administered encounter medications on file as of 04/11/2022.    Allergies (verified) Patient has no known allergies.   History: Past Medical History:  Diagnosis Date   Arthritis    Dysphagia 01/17/2017   History of blood transfusion    with childbirth   Hypothyroidism    surgical   Past Surgical History:  Procedure Laterality Date   ABDOMINAL HYSTERECTOMY     ARTHROSCOPIC HAGLUNDS REPAIR Right 01/04/2019   Procedure: ENDOSCOPIC RESECTION HAGLUNDS DEFORMITY;  Surgeon: DNewt Minion MD;  Location: [facility]  Service: Orthopedics;  Laterality: Right;   COLONOSCOPY     THYROIDECTOMY     90% removed   Family History  Problem Relation Age of Onset   Hypertension Mother    Cancer Father        Lung   Stroke Sister    Hypertension Sister    Hypertension Daughter    Hypertension Son    Lupus Sister    Breast cancer Sister    Social History   Socioeconomic History   Marital status: Married    Spouse name: Not on file   Number of children: Not on file   Years of education:  Not on file   Highest education level: Not on file  Occupational History   Not on file  Tobacco Use   Smoking status: Never   Smokeless tobacco: Never  Vaping Use   Vaping Use: Never used  Substance and Sexual Activity   Alcohol use: No   Drug use: Never   Sexual activity: Yes  Other Topics Concern   Not on file  Social History Narrative   Not on file   Social Determinants of Health   Financial Resource Strain: Low Risk  (04/11/2022)   Overall Financial Resource Strain (CARDIA)    Difficulty of Paying Living Expenses: Not hard at all  Food Insecurity: No Food  Insecurity (04/11/2022)   Hunger Vital Sign    Worried About Running Out of Food in the Last Year: Never true    Ran Out of Food in the Last Year: Never true  Transportation Needs: No Transportation Needs (04/11/2022)   PRAPARE - Hydrologist (Medical): No    Lack of Transportation (Non-Medical): No  Physical Activity: Insufficiently Active (04/11/2022)   Exercise Vital Sign    Days of Exercise per Week: 3 days    Minutes of Exercise per Session: 30 min  Stress: No Stress Concern Present (04/11/2022)   [place]    Feeling of Stress : Not at all  Social Connections: [place] (04/11/2022)   Social Connection and Isolation Panel [NHANES]    Frequency of Communication with Friends and Family: More than three times a week    Frequency of Social Gatherings with Friends and Family: More than three times a week    Attends Religious Services: More than 4 times per year    Active Member of [employer] or Organizations: Yes    Attends Music therapist: More than 4 times per year    Marital Status: Married    Tobacco Counseling Counseling given: Not Answered   Clinical Intake:  Pre-visit preparation completed: Yes  Pain : No/denies pain     Nutritional Risks: None Diabetes: No  How often do you need to have someone help you when you read instructions, pamphlets, or other written materials from your doctor or pharmacy?: 1 - Never  Diabetic?no  Interpreter Needed?: No  Information entered by :: Kirke Shaggy, LPN   Activities of Daily Living    04/11/2022    8:48 AM  In your present state of health, do you have any difficulty performing the following activities:  Hearing? 0  Vision? 0  Difficulty concentrating or making decisions? 0  Walking or climbing stairs? 1  Comment knees, heel spur in foot  Dressing or bathing? 0  Doing errands, shopping? 0  Preparing Food  and eating ? N  Using the Toilet? N  In the past six months, have you accidently leaked urine? N  Do you have problems with loss of bowel control? N  Managing your Medications? N  Managing your Finances? N  Housekeeping or managing your Housekeeping? N    Patient Care Team: Venita Lick, NP as PCP - General (Nurse Practitioner)  Indicate any recent Medical Services you may have received from other than [facility] providers in the past year (date may be approximate).     Assessment:   This is a routine wellness examination for Kerry Phillips.  Hearing/Vision screen Hearing Screening - Comments:: No aids Vision Screening - Comments:: No glasses  Dietary issues and exercise activities discussed:  Current Exercise Habits: Home exercise routine, Type of exercise: walking, Time (Minutes): 30, Frequency (Times/Week): 3, Weekly Exercise (Minutes/Week): 90, Intensity: Mild   Goals Addressed             This Visit's Progress    DIET - EAT MORE FRUITS AND VEGETABLES         Depression Screen    04/11/2022    8:46 AM 01/06/2022   11:10 AM 06/09/2021   10:43 AM 02/15/2021   10:02 AM 04/16/2020   11:25 AM 01/01/2020    9:25 AM 12/09/2019    9:52 AM  PHQ 2/9 Scores  PHQ - 2 Score 0 1 0 0 1 0 0  PHQ- 9 Score 0 4 3  1      $ Fall Risk    04/11/2022    8:48 AM 06/09/2021   10:40 AM 02/15/2021    9:54 AM 04/16/2020   11:24 AM 01/01/2020    9:25 AM  Fall Risk   Falls in the past year? 0 0 0  0  Number falls in past yr: 0 0 0  0  Injury with Fall? 0 0 0 0 0  Risk for fall due to : No Fall Risks No Fall Risks  No Fall Risks No Fall Risks  Follow up Falls prevention discussed;Falls evaluation completed Falls evaluation completed Falls evaluation completed;Education provided;Falls prevention discussed  Falls evaluation completed    FALL RISK PREVENTION PERTAINING TO THE HOME:  Any stairs in or around the home? Yes  If so, are there any without handrails? No  Home free of loose throw rugs in  walkways, pet beds, electrical cords, etc? Yes  Adequate lighting in your home to reduce risk of falls? Yes   ASSISTIVE DEVICES UTILIZED TO PREVENT FALLS:  Life alert? No  Use of a cane, walker or w/c? No  Grab bars in the bathroom? No  Shower chair or bench in shower? No  Elevated toilet seat or a handicapped toilet? No    Cognitive Function:        04/11/2022    8:53 AM  6CIT Screen  What Year? 0 points  What month? 0 points  What time? 0 points  Count back from 20 0 points  Months in reverse 0 points  Repeat phrase 0 points  Total Score 0 points    Immunizations Immunization History  Administered Date(s) Administered   Td 08/20/2008    TDAP status: Due, Education has been provided regarding the importance of this vaccine. Advised may receive this vaccine at local pharmacy or Health Dept. Aware to provide a copy of the vaccination record if obtained from local pharmacy or Health Dept. Verbalized acceptance and understanding.  Flu Vaccine status: Declined, Education has been provided regarding the importance of this vaccine but patient still declined. Advised may receive this vaccine at local pharmacy or Health Dept. Aware to provide a copy of the vaccination record if obtained from local pharmacy or Health Dept. Verbalized acceptance and understanding.  Pneumococcal vaccine status: Declined,  Education has been provided regarding the importance of this vaccine but patient still declined. Advised may receive this vaccine at local pharmacy or Health Dept. Aware to provide a copy of the vaccination record if obtained from local pharmacy or Health Dept. Verbalized acceptance and understanding.   Covid-19 vaccine status: Declined, Education has been provided regarding the importance of this vaccine but patient still declined. Advised may receive this vaccine at local pharmacy or Health Dept.or vaccine clinic. Aware  to provide a copy of the vaccination record if obtained from local  pharmacy or Health Dept. Verbalized acceptance and understanding.  Qualifies for Shingles Vaccine? Yes   Zostavax completed No   Shingrix Completed?: No.    Education has been provided regarding the importance of this vaccine. Patient has been advised to call insurance company to determine out of pocket expense if they have not yet received this vaccine. Advised may also receive vaccine at local pharmacy or Health Dept. Verbalized acceptance and understanding.  Screening Tests Health Maintenance  Topic Date Due   COVID-19 Vaccine (1) Never done   Zoster Vaccines- Shingrix (1 of 2) Never done   DTaP/Tdap/Td (2 - Tdap) 08/21/2018   Pneumonia Vaccine 7+ Years old (1 of 1 - PCV) Never done   INFLUENZA VACCINE  05/29/2022 (Originally 09/28/2021)   COLONOSCOPY (Pts 45-56yr Insurance coverage will need to be confirmed)  02/17/2023   Medicare Annual Wellness (AWV)  04/12/2023   MAMMOGRAM  12/01/2023   DEXA SCAN  05/30/2030   Hepatitis C Screening  Completed   HPV VACCINES  Aged Out    Health Maintenance  Health Maintenance Due  Topic Date Due   COVID-19 Vaccine (1) Never done   Zoster Vaccines- Shingrix (1 of 2) Never done   DTaP/Tdap/Td (2 - Tdap) 08/21/2018   Pneumonia Vaccine 68 Years old (1 of 1 - PCV) Never done    Colorectal cancer screening: Type of screening: Colonoscopy. Completed 02/16/18. Repeat every 5 years  Mammogram status: Completed 11/30/21. Repeat every year  Bone Density status: Completed 05/29/20. Results reflect: Bone density results: NORMAL. Repeat every 5 years.  Lung Cancer Screening: (Low Dose CT Chest recommended if Age 68-80years, 30 pack-year currently smoking OR have quit w/in 15years.) does not qualify.    Additional Screening:  Hepatitis C Screening: does qualify; Completed 04/05/16  Vision Screening: Recommended annual ophthalmology exams for early detection of glaucoma and other disorders of the eye. Is the patient up to date with their annual eye  exam?  No  Who is the provider or what is the name of the office in which the patient attends annual eye exams? No one If pt is not established with a provider, would they like to be referred to a provider to establish care? No .   Dental Screening: Recommended annual dental exams for proper oral hygiene  Community Resource Referral / Chronic Care Management: CRR required this visit?  No   CCM required this visit?  No      Plan:     I have personally reviewed and noted the following in the patient's chart:   Medical and social history Use of alcohol, tobacco or illicit drugs  Current medications and supplements including opioid prescriptions. Patient is not currently taking opioid prescriptions. Functional ability and status Nutritional status Physical activity Advanced directives List of other physicians Hospitalizations, surgeries, and ER visits in previous 12 months Vitals Screenings to include cognitive, depression, and falls Referrals and appointments  In addition, I have reviewed and discussed with patient certain preventive protocols, quality metrics, and best practice recommendations. A written personalized care plan for preventive services as well as general preventive health recommendations were provided to patient.     LDionisio David LPN   [id number]  Nurse Notes: none

## 2022-04-11 NOTE — Patient Instructions (Signed)
Kerry Phillips , Thank you for taking time to come for your Medicare Wellness Visit. I appreciate your ongoing commitment to your health goals. Please review the following plan we discussed and let me know if I can assist you in the future.   These are the goals we discussed:  Goals      DIET - EAT MORE FRUITS AND VEGETABLES     Weight (lb) < 200 lb (90.7 kg)        This is a list of the screening recommended for you and due dates:  Health Maintenance  Topic Date Due   COVID-19 Vaccine (1) Never done   Zoster (Shingles) Vaccine (1 of 2) Never done   DTaP/Tdap/Td vaccine (2 - Tdap) 08/21/2018   Pneumonia Vaccine (1 of 1 - PCV) Never done   Flu Shot  05/29/2022*   Colon Cancer Screening  02/17/2023   Medicare Annual Wellness Visit  04/12/2023   Mammogram  12/01/2023   DEXA scan (bone density measurement)  05/30/2030   Hepatitis C Screening: USPSTF Recommendation to screen - Ages 18-79 yo.  Completed   HPV Vaccine  Aged Out  *Topic was postponed. The date shown is not the original due date.    Advanced directives: no  Conditions/risks identified: none  Next appointment: Follow up in one year for your annual wellness visit 04/17/23 @ 8:45 am by phone   Preventive Care 65 Years and Older, Female Preventive care refers to lifestyle choices and visits with your health care provider that can promote health and wellness. What does preventive care include? A yearly physical exam. This is also called an annual well check. Dental exams once or twice a year. Routine eye exams. Ask your health care provider how often you should have your eyes checked. Personal lifestyle choices, including: Daily care of your teeth and gums. Regular physical activity. Eating a healthy diet. Avoiding tobacco and drug use. Limiting alcohol use. Practicing safe sex. Taking low-dose aspirin every day. Taking vitamin and mineral supplements as recommended by your health care provider. What happens during an  annual well check? The services and screenings done by your health care provider during your annual well check will depend on your age, overall health, lifestyle risk factors, and family history of disease. Counseling  Your health care provider may ask you questions about your: Alcohol use. Tobacco use. Drug use. Emotional well-being. Home and relationship well-being. Sexual activity. Eating habits. History of falls. Memory and ability to understand (cognition). Work and work Statistician. Reproductive health. Screening  You may have the following tests or measurements: Height, weight, and BMI. Blood pressure. Lipid and cholesterol levels. These may be checked every 5 years, or more frequently if you are over 21 years old. Skin check. Lung cancer screening. You may have this screening every year starting at age 55 if you have a 30-pack-year history of smoking and currently smoke or have quit within the past 15 years. Fecal occult blood test (FOBT) of the stool. You may have this test every year starting at age 16. Flexible sigmoidoscopy or colonoscopy. You may have a sigmoidoscopy every 5 years or a colonoscopy every 10 years starting at age 68. Hepatitis C blood test. Hepatitis B blood test. Sexually transmitted disease (STD) testing. Diabetes screening. This is done by checking your blood sugar (glucose) after you have not eaten for a while (fasting). You may have this done every 1-3 years. Bone density scan. This is done to screen for osteoporosis. You may have  this done starting at age 49. Mammogram. This may be done every 1-2 years. Talk to your health care provider about how often you should have regular mammograms. Talk with your health care provider about your test results, treatment options, and if necessary, the need for more tests. Vaccines  Your health care provider may recommend certain vaccines, such as: Influenza vaccine. This is recommended every year. Tetanus,  diphtheria, and acellular pertussis (Tdap, Td) vaccine. You may need a Td booster every 10 years. Zoster vaccine. You may need this after age 34. Pneumococcal 13-valent conjugate (PCV13) vaccine. One dose is recommended after age 76. Pneumococcal polysaccharide (PPSV23) vaccine. One dose is recommended after age 5. Talk to your health care provider about which screenings and vaccines you need and how often you need them. This information is not intended to replace advice given to you by your health care provider. Make sure you discuss any questions you have with your health care provider. Document Released: 03/13/2015 Document Revised: 11/04/2015 Document Reviewed: 12/16/2014 Elsevier Interactive Patient Education  2017 [place] Prevention in the Home Falls can cause injuries. They can happen to people of all ages. There are many things you can do to make your home safe and to help prevent falls. What can I do on the outside of my home? Regularly fix the edges of walkways and driveways and fix any cracks. Remove anything that might make you trip as you walk through a door, such as a raised step or threshold. Trim any bushes or trees on the path to your home. Use bright outdoor lighting. Clear any walking paths of anything that might make someone trip, such as rocks or tools. Regularly check to see if handrails are loose or broken. Make sure that both sides of any steps have handrails. Any raised decks and porches should have guardrails on the edges. Have any leaves, snow, or ice cleared regularly. Use sand or salt on walking paths during winter. Clean up any spills in your garage right away. This includes oil or grease spills. What can I do in the bathroom? Use night lights. Install grab bars by the toilet and in the tub and shower. Do not use towel bars as grab bars. Use non-skid mats or decals in the tub or shower. If you need to sit down in the shower, use a plastic,  non-slip stool. Keep the floor dry. Clean up any water that spills on the floor as soon as it happens. Remove soap buildup in the tub or shower regularly. Attach bath mats securely with double-sided non-slip rug tape. Do not have throw rugs and other things on the floor that can make you trip. What can I do in the bedroom? Use night lights. Make sure that you have a light by your bed that is easy to reach. Do not use any sheets or blankets that are too big for your bed. They should not hang down onto the floor. Have a firm chair that has side arms. You can use this for support while you get dressed. Do not have throw rugs and other things on the floor that can make you trip. What can I do in the kitchen? Clean up any spills right away. Avoid walking on wet floors. Keep items that you use a lot in easy-to-reach places. If you need to reach something above you, use a strong step stool that has a grab bar. Keep electrical cords out of the way. Do not use floor polish or  wax that makes floors slippery. If you must use wax, use non-skid floor wax. Do not have throw rugs and other things on the floor that can make you trip. What can I do with my stairs? Do not leave any items on the stairs. Make sure that there are handrails on both sides of the stairs and use them. Fix handrails that are broken or loose. Make sure that handrails are as long as the stairways. Check any carpeting to make sure that it is firmly attached to the stairs. Fix any carpet that is loose or worn. Avoid having throw rugs at the top or bottom of the stairs. If you do have throw rugs, attach them to the floor with carpet tape. Make sure that you have a light switch at the top of the stairs and the bottom of the stairs. If you do not have them, ask someone to add them for you. What else can I do to help prevent falls? Wear shoes that: Do not have high heels. Have rubber bottoms. Are comfortable and fit you well. Are closed  at the toe. Do not wear sandals. If you use a stepladder: Make sure that it is fully opened. Do not climb a closed stepladder. Make sure that both sides of the stepladder are locked into place. Ask someone to hold it for you, if possible. Clearly mark and make sure that you can see: Any grab bars or handrails. First and last steps. Where the edge of each step is. Use tools that help you move around (mobility aids) if they are needed. These include: Canes. Walkers. Scooters. Crutches. Turn on the lights when you go into a dark area. Replace any light bulbs as soon as they burn out. Set up your furniture so you have a clear path. Avoid moving your furniture around. If any of your floors are uneven, fix them. If there are any pets around you, be aware of where they are. Review your medicines with your doctor. Some medicines can make you feel dizzy. This can increase your chance of falling. Ask your doctor what other things that you can do to help prevent falls. This information is not intended to replace advice given to you by your health care provider. Make sure you discuss any questions you have with your health care provider. Document Released: 12/11/2008 Document Revised: 07/23/2015 Document Reviewed: 03/21/2014 Elsevier Interactive Patient Education  2017 [employer].

## 2022-05-30 ENCOUNTER — Other Ambulatory Visit: Payer: Self-pay | Admitting: Nurse Practitioner

## 2022-05-30 NOTE — Telephone Encounter (Signed)
Requested Prescriptions  Pending Prescriptions Disp Refills   levothyroxine (SYNTHROID) 100 MCG tablet [Pharmacy Med Name: LEVOTHYROXINE 100 MCG TABLET] 90 tablet 0    Sig: TAKE 1 TABLET BY MOUTH EVERY DAY     Endocrinology:  Hypothyroid Agents Passed - 05/30/2022  2:38 AM      Passed - TSH in normal range and within 360 days    TSH  Date Value Ref Range Status  07/23/2021 1.470 0.450 - 4.500 uIU/mL Final         Passed - Valid encounter within last 12 months    Recent Outpatient Visits           4 months ago Stage 3a chronic kidney disease (CKD) ([place])   [facility], Henrine Screws T, NP   11 months ago Morbid obesity ([place])   [facility] [place], Henrine Screws T, NP   1 year ago Morbid obesity ([place])   [facility] [place], Henrine Screws T, NP   2 years ago Morbid obesity [facility])   [facility] [place], Henrine Screws T, NP   2 years ago Postmenopausal   [place] My[place] Gip, DO       Future Appointments             In 2 weeks Cannady, Barbaraann Faster, NP [place], [facility]

## 2022-06-16 ENCOUNTER — Encounter: Payer: Medicare Other | Admitting: Nurse Practitioner

## 2022-06-16 DIAGNOSIS — E559 Vitamin D deficiency, unspecified: Secondary | ICD-10-CM

## 2022-06-16 DIAGNOSIS — E063 Autoimmune thyroiditis: Secondary | ICD-10-CM

## 2022-06-16 DIAGNOSIS — Z0000 Encounter for general adult medical examination without abnormal findings: Secondary | ICD-10-CM

## 2022-06-16 DIAGNOSIS — E7800 Pure hypercholesterolemia, unspecified: Secondary | ICD-10-CM

## 2022-06-16 DIAGNOSIS — N1831 Chronic kidney disease, stage 3a: Secondary | ICD-10-CM

## 2022-06-16 DIAGNOSIS — J45990 Exercise induced bronchospasm: Secondary | ICD-10-CM

## 2022-06-16 DIAGNOSIS — I10 Essential (primary) hypertension: Secondary | ICD-10-CM

## 2022-06-16 DIAGNOSIS — Z6841 Body Mass Index (BMI) 40.0 and over, adult: Secondary | ICD-10-CM

## 2022-07-10 NOTE — Patient Instructions (Signed)
Be Involved in Your Health Care:  Taking Medications When medications are taken as directed, they can greatly improve your health. But if they are not taken as instructed, they may not work. In some cases, not taking them correctly can be harmful. To help ensure your treatment remains effective and safe, understand your medications and how to take them.  Your lab results, notes and after visit summary will be available on My Chart. We strongly encourage you to use this feature. If lab results are abnormal the clinic will contact you with the appropriate steps. If the clinic does not contact you assume the results are satisfactory. You can always see your results on My Chart. If you have questions regarding your condition, please contact the clinic during office hours. You can also ask questions on My Chart.  We at [facility] are grateful that you chose us to provide care. We strive to provide excellent and compassionate care and are always looking for feedback. If you get a survey from the clinic please complete this.   Food Basics for Chronic Kidney Disease Chronic kidney disease (CKD) is when your kidneys are not working well. They cannot remove waste, fluids, and other substances from your blood the way they should. These substances can build up, which can worsen kidney damage and affect how your body works. Eating certain foods can lead to a buildup of these substances. Changing your diet can help prevent more kidney damage. Diet changes may also delay dialysis or even keep you from needing it. What nutrients should I limit? Work with your treatment team and a food expert (dietitian) to make a meal plan that's right for you. Foods you can eat and foods you should limit or avoid will depend on the stage of your kidney disease and any other health conditions you have. The items listed below are not a complete list. Talk with your dietitian to learn what is best for  you. Potassium Potassium affects how steadily your heart beats. Too much potassium in your blood can cause an irregular heartbeat or even a heart attack. You may need to limit foods that are high in potassium, such as: Liquid milk and soy milk. Salt substitutes that contain potassium. Fruits like bananas, apricots, nectarines, melon, prunes, raisins, kiwi, and oranges. Vegetables, such as potatoes, sweet potatoes, yams, tomatoes, leafy greens, beets, avocado, pumpkin, and winter squash. Beans, like lima beans. Nuts. Phosphorus Phosphorus is a mineral found in your bones. You need a balance between calcium and phosphorus to build and maintain healthy bones. Too much added phosphorus from the foods you eat can pull calcium from your bones. Losing calcium can make your bones weak and more likely to break. Too much phosphorus can also make your skin itch. You may need to limit foods that are high in phosphorus or that have added phosphorus, such as: Liquid milk and dairy products. Dark-colored sodas or soft drinks. Bran cereals and oatmeal. Protein  Protein helps you make and keep muscle. Protein also helps to repair your body's cells and tissues. One of the natural breakdown products of protein is a waste product called urea. When your kidneys are not working well, they cannot remove types of waste like urea. Reducing protein in your diet can help keep urea from building up in your blood. Depending on your stage of kidney disease, you may need to eat smaller portions of foods that are high in protein. Sources of animal protein include: Meat (all types). Fish and   seafood. Poultry. Eggs. Dairy. Other protein foods include: Beans and legumes. Nuts and nut butter. Soy, like tofu.  Sodium Salt (sodium) helps to keep a healthy balance of fluids in your body. Too much salt can increase your blood pressure, which can harm your heart and lungs. Extra salt can also cause your body to keep too much  fluid, making your kidneys work harder. You may need to limit or avoid foods that are high in salt, such as: Salt seasonings. Soy and teriyaki sauce. Packaged, precooked, cured, or processed meats, such as sausages or meat loaves. Sardines. Salted crackers and snack foods. Fast food. Canned soups and most canned foods. Pickled foods. Vegetable juice. Boxed mixes or ready-to-eat boxed meals and side dishes. Bottled dressings, sauces, and marinades. Talk with your dietitian about how much potassium, phosphorus, protein, and salt you may have each day. Helpful tips Read food labels  Check the amount of salt in foods. Limit foods that have salt or sodium listed among the first five ingredients. Try to eat low-salt foods. Check the ingredient list for added phosphorus or potassium. "Phos" in an ingredient is a sign that phosphorus has been added. Do not buy foods that are calcium-enriched or that have calcium added to them (are fortified). Buy canned vegetables and beans that say "no salt added" and rinse them before eating. Lifestyle Limit the amount of protein you eat from animal sources each day. Focus on protein from plant sources, like tofu and dried beans, peas, and lentils. Do not add salt to food when cooking or before eating. Do not eat star fruit. It can be toxic for people with kidney problems. Talk with your health care provider before taking any vitamin or mineral supplements. If told by your health care provider, track how much liquid you drink so you can avoid drinking too much. You may need to include foods you eat that are made mostly from water, like gelatin, ice cream, soups, and juicy fruits and vegetables. If you have diabetes: If you have diabetes (diabetes mellitus) and CKD, you need to keep your blood sugar (glucose) in the target range recommended by your health care provider. Follow your diabetes management plan. This may include: Checking your blood glucose  regularly. Taking medicines by mouth, or taking insulin, or both. Exercising for at least 30 minutes on 5 or more days each week, or as told by your health care provider. Tracking how many servings of carbohydrates you eat at each meal. Not using orange juice to treat low blood sugars. Instead, use apple juice, cranberry juice, or clear soda. You may be given guidelines on what foods and nutrients you may eat, and how much you can have each day. This depends on your stage of kidney disease and whether you have high blood pressure (hypertension). Follow the meal plan your dietitian gives you. To learn more: National Institute of Diabetes and Digestive and Kidney Diseases: niddk.nih.gov National Kidney Foundation: kidney.org Summary Chronic kidney disease (CKD) is when your kidneys are not working well. They cannot remove waste, fluids, and other substances from your blood the way they should. These substances can build up, which can worsen kidney damage and affect how your body works. Changing your diet can help prevent more kidney damage. Diet changes may also delay dialysis or even keep you from needing it. Diet changes are different for each person with CKD. Work with a dietitian to set up a meal plan that is right for you. This information is not intended   to replace advice given to you by your health care provider. Make sure you discuss any questions you have with your health care provider. Document Revised: 06/04/2021 Document Reviewed: 06/10/2019 Elsevier Patient Education  2023 [employer]  

## 2022-07-13 ENCOUNTER — Ambulatory Visit (INDEPENDENT_AMBULATORY_CARE_PROVIDER_SITE_OTHER): Payer: Medicare Other | Admitting: Nurse Practitioner

## 2022-07-13 ENCOUNTER — Encounter: Payer: Self-pay | Admitting: Nurse Practitioner

## 2022-07-13 VITALS — BP 120/72 | HR 99 | Temp 98.4°F | Ht 64.49 in | Wt 287.3 lb

## 2022-07-13 DIAGNOSIS — N1831 Chronic kidney disease, stage 3a: Secondary | ICD-10-CM | POA: Diagnosis not present

## 2022-07-13 DIAGNOSIS — E559 Vitamin D deficiency, unspecified: Secondary | ICD-10-CM

## 2022-07-13 DIAGNOSIS — Z6841 Body Mass Index (BMI) 40.0 and over, adult: Secondary | ICD-10-CM

## 2022-07-13 DIAGNOSIS — I10 Essential (primary) hypertension: Secondary | ICD-10-CM | POA: Diagnosis not present

## 2022-07-13 DIAGNOSIS — J069 Acute upper respiratory infection, unspecified: Secondary | ICD-10-CM | POA: Insufficient documentation

## 2022-07-13 DIAGNOSIS — E063 Autoimmune thyroiditis: Secondary | ICD-10-CM

## 2022-07-13 DIAGNOSIS — E7800 Pure hypercholesterolemia, unspecified: Secondary | ICD-10-CM

## 2022-07-13 DIAGNOSIS — J45990 Exercise induced bronchospasm: Secondary | ICD-10-CM

## 2022-07-13 LAB — MICROALBUMIN, URINE WAIVED
Creatinine, Urine Waived: 300 mg/dL (ref 10–300)
Microalb, Ur Waived: 150 mg/L — ABNORMAL HIGH (ref 0–19)

## 2022-07-13 MED ORDER — PREDNISONE 20 MG PO TABS: 40.0000 mg | ORAL_TABLET | Freq: Every day | ORAL | 0 refills | Status: AC

## 2022-07-13 MED ORDER — CALCITRIOL 0.5 MCG PO CAPS: 0.5000 ug | ORAL_CAPSULE | Freq: Two times a day (BID) | ORAL | 4 refills | Status: DC

## 2022-07-13 MED ORDER — OLMESARTAN-AMLODIPINE-HCTZ 40-5-12.5 MG PO TABS
ORAL_TABLET | ORAL | 4 refills | Status: DC
Start: 2022-07-13 — End: 2023-07-18

## 2022-07-13 MED ORDER — LEVOTHYROXINE SODIUM 100 MCG PO TABS: 100.0000 ug | ORAL_TABLET | Freq: Every day | ORAL | 0 refills | Status: DC

## 2022-07-13 MED ORDER — ALBUTEROL SULFATE HFA 108 (90 BASE) MCG/ACT IN AERS: 2.0000 | INHALATION_SPRAY | Freq: Four times a day (QID) | RESPIRATORY_TRACT | 2 refills | Status: DC | PRN

## 2022-07-13 NOTE — Assessment & Plan Note (Signed)
Ongoing, taking daily supplement, continue this and check level today. 

## 2022-07-13 NOTE — Assessment & Plan Note (Signed)
Suspect some exercise induced asthma present.  Notices wheezing with exercise.  Continue to exercise as will benefit modest weight loss.  Start Albuterol inhaler and instructed on how to use, recommend she take 2 puffs prior to exercise.  If ongoing and no benefit, then will perform spirometry and consider maintenance inhaler. 

## 2022-07-13 NOTE — Assessment & Plan Note (Signed)
BMI 48.57 with HTN and HLD.  Recommended eating smaller high protein, low fat meals more frequently and exercising 30 mins a day 5 times a week with a goal of 10-15lb weight loss in the next 3 months. Patient voiced their understanding and motivation to adhere to these recommendations.

## 2022-07-13 NOTE — Assessment & Plan Note (Signed)
Noted on past two labs with urine ALB 150 May 2024 -- recent blood work had improved.  Continue Olmesartan for kidney protection.  Educated her on kidney disease and risks for this.  Monitor closely.  Increase water intake.  Consider nephrology if worsening.

## 2022-07-13 NOTE — Assessment & Plan Note (Signed)
Chronic, stable.  BP at goal in office today.  Recommend she monitor BP at least a few mornings a week at home and document.  DASH diet at home.  Continue current medication regimen and adjust as needed.  Urine ALB 150 on check May 2024, continue Olmesartan for kidney protection.  Labs today: CMP, CBC, TSH, urine ALB.  Return in 6 months.

## 2022-07-13 NOTE — Assessment & Plan Note (Signed)
Ongoing, continue supplement -- check CMP, PTH, Vit D today. 

## 2022-07-13 NOTE — Progress Notes (Signed)
BP 120/72 (BP Location: Left Arm, Patient Position: Sitting, Cuff Size: Large)   Pulse 99   Temp 98.4 F (36.9 C) (Oral)   Ht 5' 4.49" (1.638 m)   Wt 287 lb 4.8 oz (130.3 kg)   SpO2 95%   BMI 48.57 kg/m    Subjective:    Patient ID: Kerry Phillips, female    DOB: April 09, 1955, 67 y.o.   MRN: [id number]  HPI: Kerry Phillips is a 67 y.o. female presenting on 07/13/2022 for comprehensive medical examination. Current medical complaints include:none  She currently lives with: self Menopausal Symptoms: no   HYPERTENSION Taking Olmesartan-Amlodipine- HCTZ daily.  Calcium levels in 8 range at baseline, continues on supplements. Hypertension status: controlled  Satisfied with current treatment? yes Duration of hypertension: chronic BP monitoring frequency:  occasional BP range: 120-130/80 BP medication side effects:  no Medication compliance: good compliance Aspirin: no Recurrent headaches: no Visual changes: no Palpitations: no Dyspnea: no Chest pain: no Lower extremity edema: no Dizzy/lightheaded: no  The 10-year ASCVD risk score (Arnett DK, et al., 2019) is: 9.2%   Values used to calculate the score:     Age: 48 years     Sex: Female     Is Non-Hispanic African American: Yes     Diabetic: No     Tobacco smoker: No     Systolic Blood Pressure: 120 mmHg     Is BP treated: No     HDL Cholesterol: 60 mg/dL     Total Cholesterol: 232 mg/dL  HYPOTHYROIDISM Taking Levothyroxine 100 MCG daily. Thyroid control status:stable Satisfied with current treatment? yes Medication side effects: no Medication compliance: good compliance Etiology of hypothyroidism:  Recent dose adjustment:no Fatigue: no Cold intolerance: no Heat intolerance: no Weight gain: no Weight loss: no Constipation: no Diarrhea/loose stools: no Palpitations: no Lower extremity edema: no Anxiety/depressed mood: no  CHRONIC KIDNEY DISEASE CKD status: stable Medications renally dose: yes Previous renal  evaluation: no Pneumovax: refuses Influenza Vaccine: refuses   UPPER RESPIRATORY TRACT INFECTION & ASTHMA Has been sick since Monday.  Has not Covid tested at home.  Does have allergies in summer and spring seasons. Fever: no Cough: yes Shortness of breath: yes Wheezing: yes Chest pain: no Chest tightness: yes Chest congestion: yes Nasal congestion: yes Runny nose: yes Post nasal drip: yes Sneezing: no Sore throat: yes Swollen glands: no Sinus pressure: yes Headache: yes Face pain: yes Toothache: no Ear pain: yes left Ear pressure: yes bilateral Eyes red/itching:no Eye drainage/crusting: no  Vomiting: no Rash: no Fatigue: yes Sick contacts: no Strep contacts: no  Context: stable Recurrent sinusitis: no Relief with OTC cold/cough medications: yes  Treatments attempted: Dayquil    Depression Screen done today and results listed below:     07/13/2022   10:12 AM 04/11/2022    8:46 AM 01/06/2022   11:10 AM 06/09/2021   10:43 AM 02/15/2021   10:02 AM  Depression screen PHQ 2/9  Decreased Interest 0 0 0 0 0  Down, Depressed, Hopeless 0 0 1 0 0  PHQ - 2 Score 0 0 1 0 0  Altered sleeping 0 0 1 1   Tired, decreased energy 0 0 1 1   Change in appetite 0 0 0 1   Feeling bad or failure about yourself  0 0 1 0   Trouble concentrating 0 0  0   Moving slowly or fidgety/restless 0 0 0 0   Suicidal thoughts 0 0 0 0   PHQ-9  Score 0 0 4 3   Difficult doing work/chores Not difficult at all Not difficult at all Not difficult at all        07/13/2022   10:12 AM 01/06/2022   11:10 AM 06/09/2021   10:44 AM  GAD 7 : Generalized Anxiety Score  Nervous, Anxious, on Edge 0 1 0  Control/stop worrying 0 1 0  Worry too much - different things 0 1 0  Trouble relaxing 0 1 0  Restless 0 1 0  Easily annoyed or irritable 0 1 0  Afraid - awful might happen 0 1 0  Total GAD 7 Score 0 7 0  Anxiety Difficulty  Not difficult at all Not difficult at all   Functional Status Survey: Is the  patient deaf or have difficulty hearing?: No Does the patient have difficulty seeing, even when wearing glasses/contacts?: No Does the patient have difficulty concentrating, remembering, or making decisions?: No Does the patient have difficulty walking or climbing stairs?: No Does the patient have difficulty dressing or bathing?: No Does the patient have difficulty doing errands alone such as visiting a doctor's office or shopping?: No      04/16/2020   11:24 AM 02/15/2021    9:54 AM 06/09/2021   10:40 AM 04/11/2022    8:48 AM 07/13/2022   10:12 AM  Fall Risk  Falls in the past year?  0 0 0 0  Was there an injury with Fall? 0 0 0 0 0  Fall Risk Category Calculator  0 0 0 0  Fall Risk Category (Retired)  Low Low    (RETIRED) Patient Fall Risk Level  Low fall risk Low fall risk    Patient at Risk for Falls Due to No Fall Risks  No Fall Risks No Fall Risks No Fall Risks  Fall risk Follow up  Falls evaluation completed;Education provided;Falls prevention discussed Falls evaluation completed Falls prevention discussed;Falls evaluation completed Education provided    Past Medical History:  Past Medical History:  Diagnosis Date   Arthritis    Dysphagia 01/17/2017   History of blood transfusion    with childbirth   Hypothyroidism    surgical    Surgical History:  Past Surgical History:  Procedure Laterality Date   ABDOMINAL HYSTERECTOMY     ARTHROSCOPIC HAGLUNDS REPAIR Right 01/04/2019   Procedure: ENDOSCOPIC RESECTION HAGLUNDS DEFORMITY;  Surgeon: Nadara Mustard, MD;  Location: [facility] OR;  Service: Orthopedics;  Laterality: Right;   COLONOSCOPY     THYROIDECTOMY     90% removed    Medications:  Current Outpatient Medications on File Prior to Visit  Medication Sig   Iron-Vitamins (GERITOL TONIC PO) Take 5 mLs by mouth daily.   No current facility-administered medications on file prior to visit.    Allergies:  No Known Allergies  Social History:  Social History    Socioeconomic History   Marital status: Married    Spouse name: Not on file   Number of children: Not on file   Years of education: Not on file   Highest education level: Not on file  Occupational History   Not on file  Tobacco Use   Smoking status: Never   Smokeless tobacco: Never  Vaping Use   Vaping Use: Never used  Substance and Sexual Activity   Alcohol use: No   Drug use: Never   Sexual activity: Yes  Other Topics Concern   Not on file  Social History Narrative   Not on file   Social  Determinants of Health   Financial Resource Strain: Low Risk  (04/11/2022)   Overall Financial Resource Strain (CARDIA)    Difficulty of Paying Living Expenses: Not hard at all  Food Insecurity: No Food Insecurity (04/11/2022)   Hunger Vital Sign    Worried About Running Out of Food in the Last Year: Never true    Ran Out of Food in the Last Year: Never true  Transportation Needs: No Transportation Needs (04/11/2022)   PRAPARE - Administrator, Civil Service (Medical): No    Lack of Transportation (Non-Medical): No  Physical Activity: Insufficiently Active (04/11/2022)   Exercise Vital Sign    Days of Exercise per Week: 3 days    Minutes of Exercise per Session: 30 min  Stress: No Stress Concern Present (04/11/2022)   [employer] of Occupational Health - Occupational Stress Questionnaire    Feeling of Stress : Not at all  Social Connections: Socially Integrated (04/11/2022)   Social Connection and Isolation Panel [NHANES]    Frequency of Communication with Friends and Family: More than three times a week    Frequency of Social Gatherings with Friends and Family: More than three times a week    Attends Religious Services: More than 4 times per year    Active Member of [employer] or Organizations: Yes    Attends Engineer, structural: More than 4 times per year    Marital Status: Married  Catering manager Violence: Not At Risk (04/11/2022)   Humiliation, Afraid,  Rape, and Kick questionnaire    Fear of Current or Ex-Partner: No    Emotionally Abused: No    Physically Abused: No    Sexually Abused: No   Social History   Tobacco Use  Smoking Status Never  Smokeless Tobacco Never   Social History   Substance and Sexual Activity  Alcohol Use No    Family History:  Family History  Problem Relation Age of Onset   Hypertension Mother    Cancer Father        Lung   Stroke Sister    Hypertension Sister    Hypertension Daughter    Hypertension Son    Lupus Sister    Breast cancer Sister     Past medical history, surgical history, medications, allergies, family history and social history reviewed with patient today and changes made to appropriate areas of the chart.   ROS All other ROS negative except what is listed above and in the HPI.      Objective:    BP 120/72 (BP Location: Left Arm, Patient Position: Sitting, Cuff Size: Large)   Pulse 99   Temp 98.4 F (36.9 C) (Oral)   Ht 5' 4.49" (1.638 m)   Wt 287 lb 4.8 oz (130.3 kg)   SpO2 95%   BMI 48.57 kg/m   Wt Readings from Last 3 Encounters:  07/13/22 287 lb 4.8 oz (130.3 kg)  04/11/22 287 lb (130.2 kg)  01/06/22 287 lb 1.6 oz (130.2 kg)    Physical Exam Vitals and nursing note reviewed. Exam conducted with a chaperone present.  Constitutional:      General: She is awake. She is not in acute distress.    Appearance: She is well-developed and well-groomed. She is obese. She is not ill-appearing or toxic-appearing.  HENT:     Head: Normocephalic and atraumatic.     Right Ear: Hearing, tympanic membrane, ear canal and external ear normal. No drainage. No middle ear effusion. Tympanic membrane is  not injected.     Left Ear: Hearing, tympanic membrane, ear canal and external ear normal. No drainage.  No middle ear effusion. Tympanic membrane is not injected.     Ears:     Comments: Scant cerumen both ears, no erythema.    Nose: Rhinorrhea present. Rhinorrhea is clear.      Right Sinus: No maxillary sinus tenderness or frontal sinus tenderness.     Left Sinus: No maxillary sinus tenderness or frontal sinus tenderness.     Mouth/Throat:     Mouth: Mucous membranes are moist.     Pharynx: Oropharynx is clear. Uvula midline. Posterior oropharyngeal erythema (mild) present. No pharyngeal swelling or oropharyngeal exudate.  Eyes:     General: Lids are normal.        Right eye: No discharge.        Left eye: No discharge.     Extraocular Movements: Extraocular movements intact.     Conjunctiva/sclera: Conjunctivae normal.     Pupils: Pupils are equal, round, and reactive to light.     Visual Fields: Right eye visual fields normal and left eye visual fields normal.  Neck:     Thyroid: No thyromegaly.     Vascular: No carotid bruit.     Trachea: Trachea normal.  Cardiovascular:     Rate and Rhythm: Normal rate and regular rhythm.     Heart sounds: Normal heart sounds. No murmur heard.    No gallop.  Pulmonary:     Effort: Pulmonary effort is normal. No accessory muscle usage or respiratory distress.     Breath sounds: Normal breath sounds. No decreased breath sounds, wheezing or rhonchi.  Chest:  Breasts:    Right: Normal.     Left: Normal.  Abdominal:     General: Bowel sounds are normal.     Palpations: Abdomen is soft. There is no hepatomegaly or splenomegaly.     Tenderness: There is no abdominal tenderness.  Musculoskeletal:        General: Normal range of motion.     Cervical back: Normal range of motion and neck supple.     Right lower leg: No edema.     Left lower leg: No edema.  Lymphadenopathy:     Head:     Right side of head: No submental, submandibular, tonsillar, preauricular or posterior auricular adenopathy.     Left side of head: No submental, submandibular, tonsillar, preauricular or posterior auricular adenopathy.     Cervical: No cervical adenopathy.     Upper Body:     Right upper body: No supraclavicular, axillary or pectoral  adenopathy.     Left upper body: No supraclavicular, axillary or pectoral adenopathy.  Skin:    General: Skin is warm and dry.     Capillary Refill: Capillary refill takes less than 2 seconds.     Findings: No rash.  Neurological:     Mental Status: She is alert and oriented to person, place, and time.     Gait: Gait is intact.     Deep Tendon Reflexes: Reflexes are normal and symmetric.     Reflex Scores:      Brachioradialis reflexes are 2+ on the right side and 2+ on the left side.      Patellar reflexes are 2+ on the right side and 2+ on the left side. Psychiatric:        Attention and Perception: Attention normal.        Mood and Affect: Mood normal.  Speech: Speech normal.        Behavior: Behavior normal. Behavior is cooperative.        Thought Content: Thought content normal.        Judgment: Judgment normal.    Results for orders placed or performed in visit on 01/06/22  Comprehensive metabolic panel  Result Value Ref Range   Glucose 81 70 - 99 mg/dL   BUN 13 8 - 27 mg/dL   Creatinine, Ser 1.61 0.57 - 1.00 mg/dL   eGFR 65 >09 UE/AVW/0.98   BUN/Creatinine Ratio 14 12 - 28   Sodium 140 134 - 144 mmol/L   Potassium 4.2 3.5 - 5.2 mmol/L   Chloride 101 96 - 106 mmol/L   CO2 23 20 - 29 mmol/L   Calcium 8.5 (L) 8.7 - 10.3 mg/dL   Total Protein 7.2 6.0 - 8.5 g/dL   Albumin 4.8 3.9 - 4.9 g/dL   Globulin, Total 2.4 1.5 - 4.5 g/dL   Albumin/Globulin Ratio 2.0 1.2 - 2.2   Bilirubin Total 0.4 0.0 - 1.2 mg/dL   Alkaline Phosphatase 55 44 - 121 IU/L   AST 13 0 - 40 IU/L   ALT 8 0 - 32 IU/L  Lipid Panel w/o Chol/HDL Ratio  Result Value Ref Range   Cholesterol, Total 232 (H) 100 - 199 mg/dL   Triglycerides 119 0 - 149 mg/dL   HDL 60 >14 mg/dL   VLDL Cholesterol Cal 18 5 - 40 mg/dL   LDL Chol Calc (NIH) 782 (H) 0 - 99 mg/dL  VITAMIN D 25 Hydroxy (Vit-D Deficiency, Fractures)  Result Value Ref Range   Vit D, 25-Hydroxy 14.9 (L) 30.0 - 100.0 ng/mL  PTH, intact and  calcium  Result Value Ref Range   PTH 24 15 - 65 pg/mL   PTH Interp Comment       Assessment & Plan:   Problem List Items Addressed This Visit       Cardiovascular and Mediastinum   Essential hypertension    Chronic, stable.  BP at goal in office today.  Recommend she monitor BP at least a few mornings a week at home and document.  DASH diet at home.  Continue current medication regimen and adjust as needed.  Urine ALB 150 on check May 2024, continue Olmesartan for kidney protection.  Labs today: CMP, CBC, TSH, urine ALB.  Return in 6 months.       Relevant Medications   Olmesartan-amLODIPine-HCTZ 40-5-12.5 MG TABS   Other Relevant Orders   Comprehensive metabolic panel     Respiratory   Exercise-induced asthma    Suspect some exercise induced asthma present.  Notices wheezing with exercise.  Continue to exercise as will benefit modest weight loss.  Start Albuterol inhaler and instructed on how to use, recommend she take 2 puffs prior to exercise.  If ongoing and no benefit, then will perform spirometry and consider maintenance inhaler.      Relevant Medications   predniSONE (DELTASONE) 20 MG tablet   albuterol (VENTOLIN HFA) 108 (90 Base) MCG/ACT inhaler   Other Relevant Orders   CBC with Differential/Platelet   URI, acute    Symptoms for 3 days, no fever.  Suspect viral infection.  No abx at this time, discussed with her at length.  If by Monday no improvement, she is to contact provider and will send in abx therapy.  At this time sent in Prednisone 40 MG x 5 days and Albuterol inhaler.  Educated her on this.  Recommend: - Increased rest - Increasing Fluids - Acetaminophen as needed for fever/pain.  - Salt water gargling, chloraseptic spray and throat lozenges - Coricidin - Humidifying the air.         Endocrine   Hashimoto's thyroiditis    Chronic, ongoing.  Continue current medication regimen and adjust as needed.  Recent labs improved, recheck today.  Return in 6  months.        Relevant Medications   levothyroxine (SYNTHROID) 100 MCG tablet   Other Relevant Orders   T4, free   TSH     Genitourinary   Stage 3a chronic kidney disease (CKD) (HCC) - Primary    Noted on past two labs with urine ALB 150 May 2024 -- recent blood work had improved.  Continue Olmesartan for kidney protection.  Educated her on kidney disease and risks for this.  Monitor closely.  Increase water intake.  Consider nephrology if worsening.      Relevant Orders   Comprehensive metabolic panel   Microalbumin, Urine Waived     Other   BMI 45.0-49.9, adult (HCC)    Refer to morbid obesity plan of care.       Elevated LDL cholesterol level    Noted on labs recently with ASCVD 9.2%.  Continue diet focus and recheck fasting labs today.  She prefers not to start statin, but have had at length discussion with her recommendation to start one if levels remain high and ASCVD >7%.      Relevant Orders   Comprehensive metabolic panel   Lipid Panel w/o Chol/HDL Ratio   Hypocalcemia    Ongoing, continue supplement -- check CMP, PTH, Vit D today.      Relevant Medications   calcitRIOL (ROCALTROL) 0.5 MCG capsule   Other Relevant Orders   Comprehensive metabolic panel   PTH, intact and calcium   Morbid obesity (HCC)    BMI 48.57 with HTN and HLD.  Recommended eating smaller high protein, low fat meals more frequently and exercising 30 mins a day 5 times a week with a goal of 10-15lb weight loss in the next 3 months. Patient voiced their understanding and motivation to adhere to these recommendations.       Vitamin D deficiency    Ongoing, taking daily supplement, continue this and check level today.      Relevant Orders   VITAMIN D 25 Hydroxy (Vit-D Deficiency, Fractures)     Follow up plan: Return in about 6 months (around 01/13/2023) for HTN/HLD, ASTHMA, CKD, THYROID.   LABORATORY TESTING:  - Pap smear: not applicable  IMMUNIZATIONS:   - Tdap: Tetanus  vaccination status reviewed: will obtain next visit, is sick today - Influenza: Refused - Pneumovax: Refuses - Prevnar: Refuses - HPV: Not applicable - Zostavax vaccine: Refused  SCREENING: -Mammogram: Up to date  - Colonoscopy: Up to date -- due December 2024 - Bone Density: Up to date  -Hearing Test: Not applicable  -Spirometry: Not applicable   PATIENT COUNSELING:   Advised to take 1 mg of folate supplement per day if capable of pregnancy.   Sexuality: Discussed sexually transmitted diseases, partner selection, use of condoms, avoidance of unintended pregnancy  and contraceptive alternatives.   Advised to avoid cigarette smoking.  I discussed with the patient that most people either abstain from alcohol or drink within safe limits (<=14/week and <=4 drinks/occasion for males, <=7/weeks and <= 3 drinks/occasion for females) and that the risk for alcohol disorders and other health effects rises proportionally  with the number of drinks per week and how often a drinker exceeds daily limits.  Discussed cessation/primary prevention of drug use and availability of treatment for abuse.   Diet: Encouraged to adjust caloric intake to maintain  or achieve ideal body weight, to reduce intake of dietary saturated fat and total fat, to limit sodium intake by avoiding high sodium foods and not adding table salt, and to maintain adequate dietary potassium and calcium preferably from fresh fruits, vegetables, and low-fat dairy products.    Stressed the importance of regular exercise  Injury prevention: Discussed safety belts, safety helmets, smoke detector, smoking near bedding or upholstery.   Dental health: Discussed importance of regular tooth brushing, flossing, and dental visits.    NEXT PREVENTATIVE PHYSICAL DUE IN 1 YEAR. Return in about 6 months (around 01/13/2023) for HTN/HLD, ASTHMA, CKD, THYROID.

## 2022-07-13 NOTE — Assessment & Plan Note (Signed)
Chronic, ongoing.  Continue current medication regimen and adjust as needed.  Recent labs improved, recheck today.  Return in 6 months.

## 2022-07-13 NOTE — Assessment & Plan Note (Signed)
Noted on labs recently with ASCVD 9.2%.  Continue diet focus and recheck fasting labs today.  She prefers not to start statin, but have had at length discussion with her recommendation to start one if levels remain high and ASCVD >7%.

## 2022-07-13 NOTE — Assessment & Plan Note (Signed)
Refer to morbid obesity plan of care. 

## 2022-07-13 NOTE — Assessment & Plan Note (Signed)
Symptoms for 3 days, no fever.  Suspect viral infection.  No abx at this time, discussed with her at length.  If by Monday no improvement, she is to contact provider and will send in abx therapy.  At this time sent in Prednisone 40 MG x 5 days and Albuterol inhaler.  Educated her on this.  Recommend: - Increased rest - Increasing Fluids - Acetaminophen as needed for fever/pain.  - Salt water gargling, chloraseptic spray and throat lozenges - Coricidin - Humidifying the air.

## 2022-07-14 LAB — COMPREHENSIVE METABOLIC PANEL
ALT: 7 IU/L (ref 0–32)
AST: 14 IU/L (ref 0–40)
Albumin/Globulin Ratio: 1.8 (ref 1.2–2.2)
Albumin: 4.4 g/dL (ref 3.9–4.9)
Alkaline Phosphatase: 53 IU/L (ref 44–121)
BUN/Creatinine Ratio: 10 — ABNORMAL LOW (ref 12–28)
BUN: 11 mg/dL (ref 8–27)
Bilirubin Total: 0.4 mg/dL (ref 0.0–1.2)
CO2: 22 mmol/L (ref 20–29)
Calcium: 8.5 mg/dL — ABNORMAL LOW (ref 8.7–10.3)
Chloride: 99 mmol/L (ref 96–106)
Creatinine, Ser: 1.1 mg/dL — ABNORMAL HIGH (ref 0.57–1.00)
Globulin, Total: 2.4 g/dL (ref 1.5–4.5)
Glucose: 100 mg/dL — ABNORMAL HIGH (ref 70–99)
Potassium: 3.8 mmol/L (ref 3.5–5.2)
Sodium: 138 mmol/L (ref 134–144)
Total Protein: 6.8 g/dL (ref 6.0–8.5)
eGFR: 55 mL/min/{1.73_m2} — ABNORMAL LOW (ref >59)

## 2022-07-14 LAB — LIPID PANEL W/O CHOL/HDL RATIO
Cholesterol, Total: 210 mg/dL — ABNORMAL HIGH (ref 100–199)
HDL: 48 mg/dL (ref >39)
LDL Chol Calc (NIH): 139 mg/dL — ABNORMAL HIGH (ref 0–99)
Triglycerides: 128 mg/dL (ref 0–149)
VLDL Cholesterol Cal: 23 mg/dL (ref 5–40)

## 2022-07-14 LAB — CBC WITH DIFFERENTIAL/PLATELET
Basophils Absolute: 0.1 10*3/uL (ref 0.0–0.2)
Basos: 1 %
EOS (ABSOLUTE): 0.1 10*3/uL (ref 0.0–0.4)
Eos: 2 %
Hematocrit: 37.2 % (ref 34.0–46.6)
Hemoglobin: 12.4 g/dL (ref 11.1–15.9)
Immature Grans (Abs): 0 10*3/uL (ref 0.0–0.1)
Immature Granulocytes: 0 %
Lymphocytes Absolute: 0.7 10*3/uL (ref 0.7–3.1)
Lymphs: 14 %
MCH: 28.2 pg (ref 26.6–33.0)
MCHC: 33.3 g/dL (ref 31.5–35.7)
MCV: 85 fL (ref 79–97)
Monocytes Absolute: 0.6 10*3/uL (ref 0.1–0.9)
Monocytes: 11 %
Neutrophils Absolute: 3.9 10*3/uL (ref 1.4–7.0)
Neutrophils: 72 %
Platelets: 284 10*3/uL (ref 150–450)
RBC: 4.4 x10E6/uL (ref 3.77–5.28)
RDW: 14.4 % (ref 11.7–15.4)
WBC: 5.4 10*3/uL (ref 3.4–10.8)

## 2022-07-14 LAB — T4, FREE: Free T4: 1.11 ng/dL (ref 0.82–1.77)

## 2022-07-14 LAB — PTH, INTACT AND CALCIUM: PTH: 16 pg/mL (ref 15–65)

## 2022-07-14 LAB — TSH: TSH: 2.15 u[IU]/mL (ref 0.450–4.500)

## 2022-07-14 LAB — VITAMIN D 25 HYDROXY (VIT D DEFICIENCY, FRACTURES): Vit D, 25-Hydroxy: 33.3 ng/mL (ref 30.0–100.0)

## 2022-07-14 NOTE — Progress Notes (Signed)
Contacted via MyChart The 10-year ASCVD risk score (Arnett DK, et al., 2019) is: 8.6%   Values used to calculate the score:     Age: 67 years     Sex: Female     Is Non-Hispanic African American: Yes     Diabetic: No     Tobacco smoker: No     Systolic Blood Pressure: 120 mmHg     Is BP treated: No     HDL Cholesterol: 48 mg/dL     Total Cholesterol: 210 mg/dL   Good afternoon Tyeisha, your labs have returned: - Kidney function, creatinine and eGFR, showing mild stage 3a kidney disease, slight reduction from last check when was in normal range.  As we discussed yesterday we will continue to monitor this closely.  Continue your blood pressure medication which has Olmesartan and this is kidney protective.  Avoid Ibuprofen products and drink plenty of water daily. - Calcium level remains a little low, ensure you are taking supplement daily.  Vitamin D level normal range, continue supplement. - Thyroid levels stable, continue Levothyroxine dosing. - CBC shows no anemia or infection. - Cholesterol levels remain elevated, I do recommend starting a low dose of Rosuvastatin at 10 MG to help lower these and help prevent heart attack or stroke.  Would you like to try this?  Let me know.  Any questions?  I am waiting on parathyroid level, will let you know if abnormal.:) Keep being stellar!!  Thank you for allowing me to participate in your care.  I appreciate you. Kindest regards, Jerlean Peralta

## 2022-07-20 ENCOUNTER — Ambulatory Visit: Payer: Self-pay | Admitting: *Deleted

## 2022-07-20 MED ORDER — AMOXICILLIN-POT CLAVULANATE 875-125 MG PO TABS: 1.0000 | ORAL_TABLET | Freq: Two times a day (BID) | ORAL | 0 refills | Status: AC

## 2022-07-20 NOTE — Telephone Encounter (Signed)
  Summary: Request for antibiotic   Patient states that when she was in the office on 5/15 Jolene said that is she wasn't feeling better in a week she would give her an antibiotic. Patient states she still has congestion, coughing and headaches. Patient would like an antibiotic.          Called patient to review sx of ongoing cough, congestion, headache. Last OV 07/13/22. Requesting medication. No answer, LVMTCB [phone].

## 2022-07-20 NOTE — Addendum Note (Signed)
Addended by: Aura Dials T on: 07/20/2022 12:18 PM   Modules accepted: Orders

## 2022-07-20 NOTE — Telephone Encounter (Signed)
Patient was made aware that Kerry Phillips received her message and that Kerry Phillips sent in Augmentin for her to take for the next 7 days. Patient verbalized understanding

## 2022-07-20 NOTE — Telephone Encounter (Signed)
2nd attempt to contact patient to review sx of congestion , cough , not feeling better. And told to call back. Requesting antibiotics. No answer, LVMTCB [phone].

## 2022-07-20 NOTE — Telephone Encounter (Signed)
  Chief Complaint: URI Symptoms: cough with congestion, HA Frequency: 1 week Pertinent Negatives: Patient denies SOB or fever Disposition: [] ED /[] Urgent Care (no appt availability in office) / [] Appointment(In office/virtual)/ []  [place] [facility]/ [] Home Care/ [] Refused Recommended Disposition /[] [place] [facility]/ [x]  Follow-up with PCP Additional Notes: pt had CPE on 07/13/22, was given rxs and told if still not feeling better to call back and PCP would send in abx. Pt states she is feeling a little better but still having a lot of cough with nasty congestion. Pt requesting abx to be sent to [id number]. Advised would send message back and have nurse FU with pt.   Summary: Request for antibiotic   Patient states that when she was in the office on 5/15 Jolene said that is she wasn't feeling better in a week she would give her an antibiotic. Patient states she still has congestion, coughing and headaches. Patient would like an antibiotic     Reason for Disposition  [1] Continuous (nonstop) coughing interferes with work or school AND [2] no improvement using cough treatment per Care Advice  Answer Assessment - Initial Assessment Questions 1. ONSET: "When did the cough begin?"      1 week  2. SEVERITY: "How bad is the cough today?"      moderate 3. SPUTUM: "Describe the color of your sputum" (none, dry cough; clear, white, yellow, green)     Yellow green 5. DIFFICULTY BREATHING: "Are you having difficulty breathing?" If Yes, ask: "How bad is it?" (e.g., mild, moderate, severe)    - MILD: No SOB at rest, mild SOB with walking, speaks normally in sentences, can lie down, no retractions, pulse < 100.    - MODERATE: SOB at rest, SOB with minimal exertion and prefers to sit, cannot lie down flat, speaks in phrases, mild retractions, audible wheezing, pulse 100-120.    - SEVERE: Very SOB at rest, speaks in single words, struggling to breathe, sitting hunched forward, retractions, pulse >  120      no 6. FEVER: "Do you have a fever?" If Yes, ask: "What is your temperature, how was it measured, and when did it start?"     no 10. OTHER SYMPTOMS: "Do you have any other symptoms?" (e.g., runny nose, wheezing, chest pain)       Cough with congestion and HA  Protocols used: Cough - Acute Productive-A-AH

## 2022-09-04 ENCOUNTER — Other Ambulatory Visit: Payer: Self-pay | Admitting: Nurse Practitioner

## 2022-09-05 NOTE — Telephone Encounter (Signed)
Requested Prescriptions  Refused Prescriptions Disp Refills   levothyroxine (SYNTHROID) 100 MCG tablet [Pharmacy Med Name: LEVOTHYROXINE 100 MCG TABLET] 90 tablet 0    Sig: TAKE 1 TABLET BY MOUTH EVERY DAY     Endocrinology:  Hypothyroid Agents Passed - 09/04/2022  8:49 AM      Passed - TSH in normal range and within 360 days    TSH  Date Value Ref Range Status  07/13/2022 2.150 0.450 - 4.500 uIU/mL Final         Passed - Valid encounter within last 12 months    Recent Outpatient Visits           1 month ago Stage 3a chronic kidney disease (CKD) (HCC)   [place] [facility] [place], Jolene T, NP   8 months ago Stage 3a chronic kidney disease (CKD) (HCC)   [place] [facility] [place], Corrie Dandy T, NP   1 year ago Morbid obesity (HCC)   [place] [facility] [place], Corrie Dandy T, NP   1 year ago Morbid obesity (HCC)   [place] [facility] [place], Corrie Dandy T, NP   2 years ago Morbid obesity (HCC)   [place] [facility] [place], Dorie Rank, NP       Future Appointments             In 4 months Cannady, Dorie Rank, NP  [facility]

## 2022-11-09 ENCOUNTER — Other Ambulatory Visit: Payer: Self-pay | Admitting: Nurse Practitioner

## 2022-11-09 DIAGNOSIS — Z1231 Encounter for screening mammogram for malignant neoplasm of breast: Secondary | ICD-10-CM

## 2022-11-18 IMAGING — MG MM DIGITAL SCREENING BILAT W/ TOMO AND CAD
6 of 12 series · 6 of 36 positions shown · non-contrast
Comparison: Previous exam(s).

CLINICAL DATA: Screening.

EXAM:
DIGITAL SCREENING BILATERAL MAMMOGRAM WITH TOMOSYNTHESIS AND CAD
TECHNIQUE: Bilateral screening digital craniocaudal and mediolateral oblique
mammograms were obtained. Bilateral screening digital breast
tomosynthesis was performed. The images were evaluated with
computer-aided detection.

[R CV synth-2D]
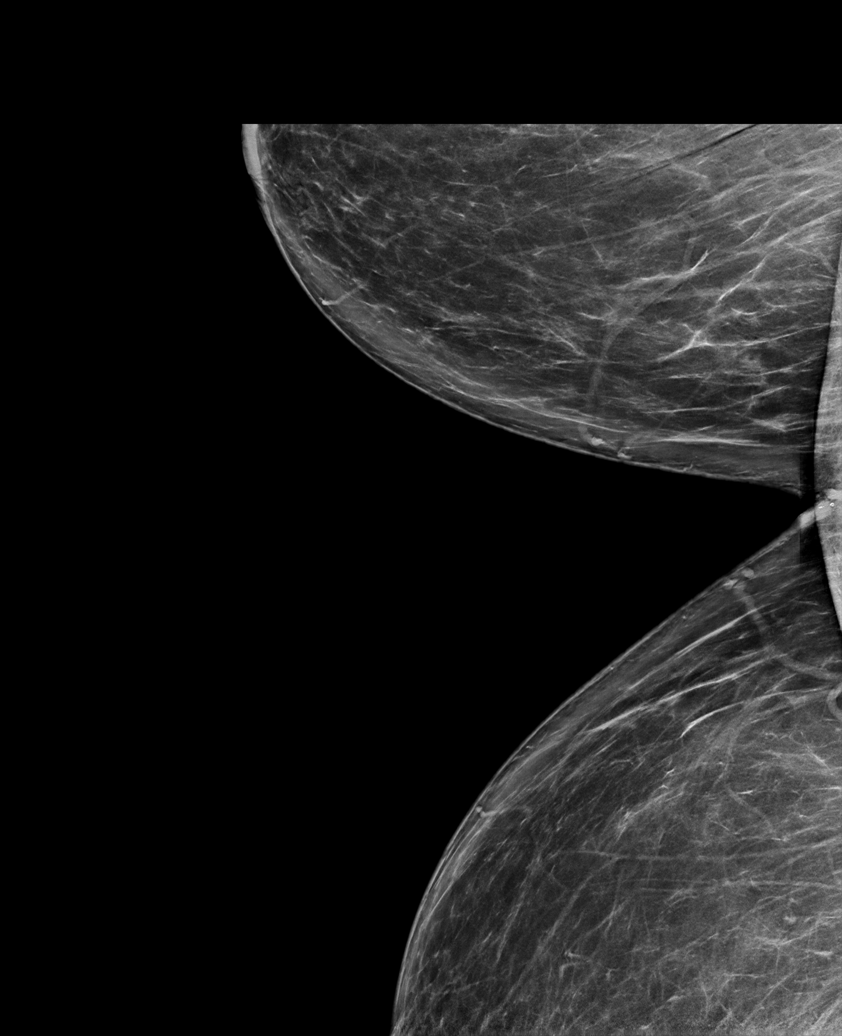

[L CC synth-2D (1 of 2)]
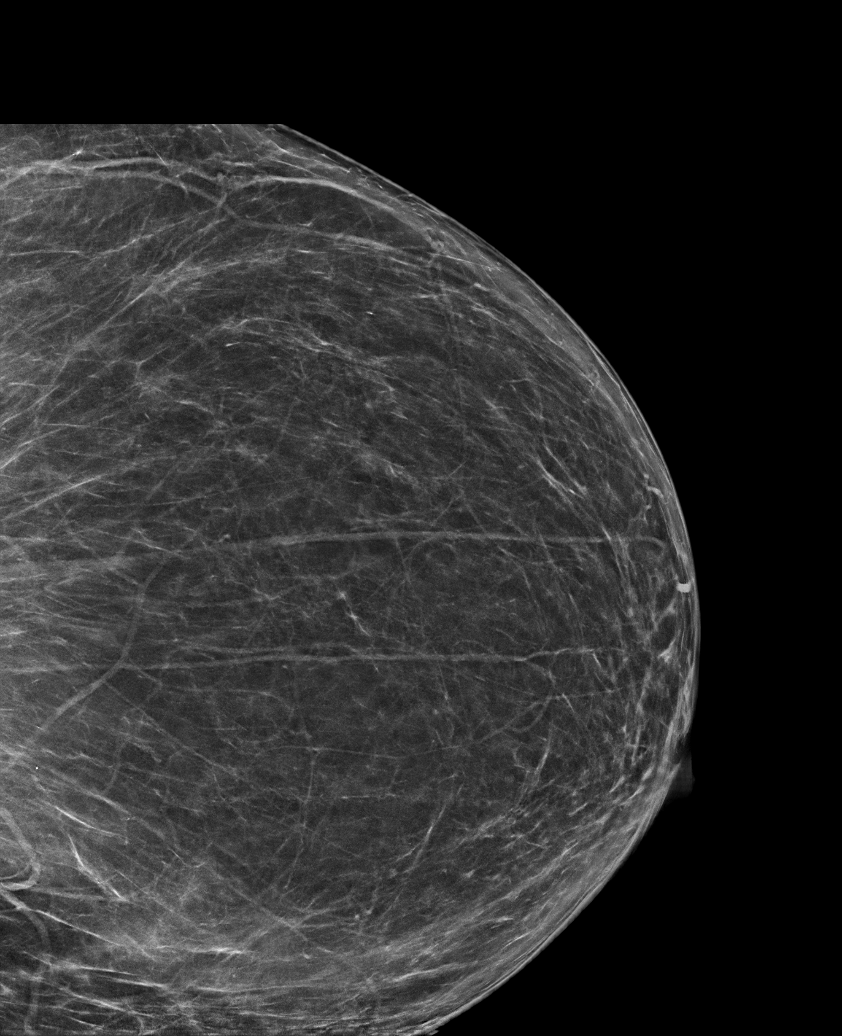

[L CC synth-2D (2 of 2)]
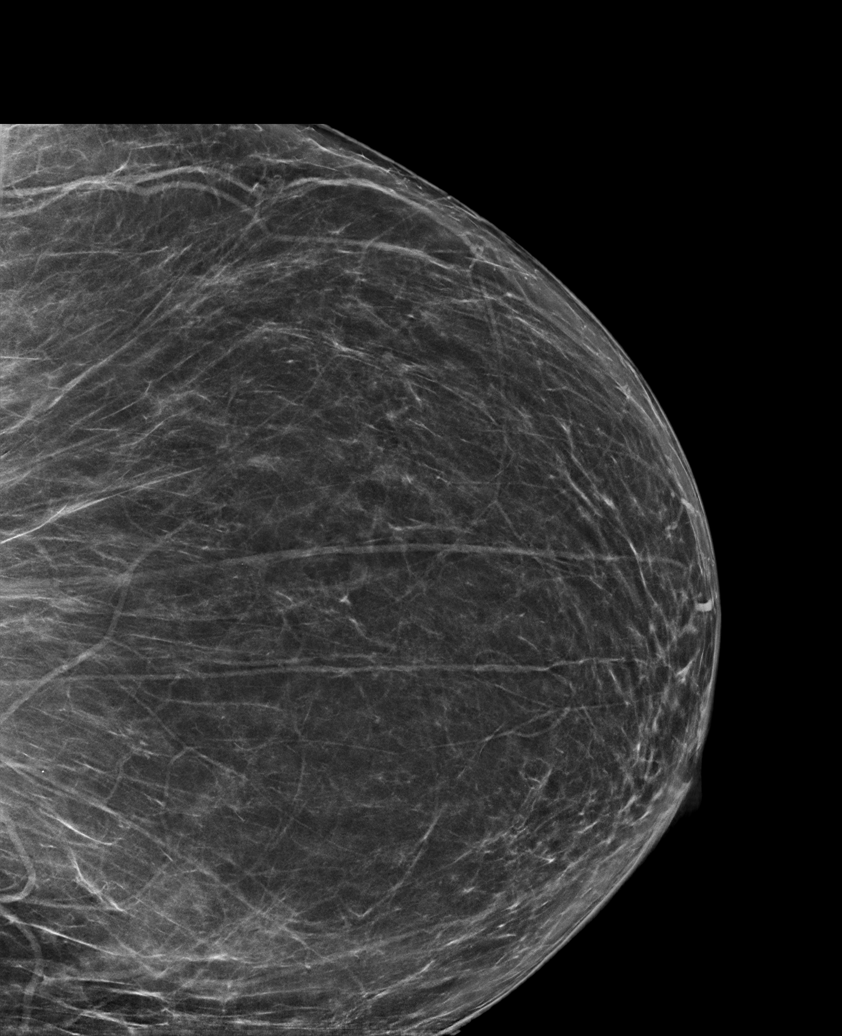

[R CC synth-2D]
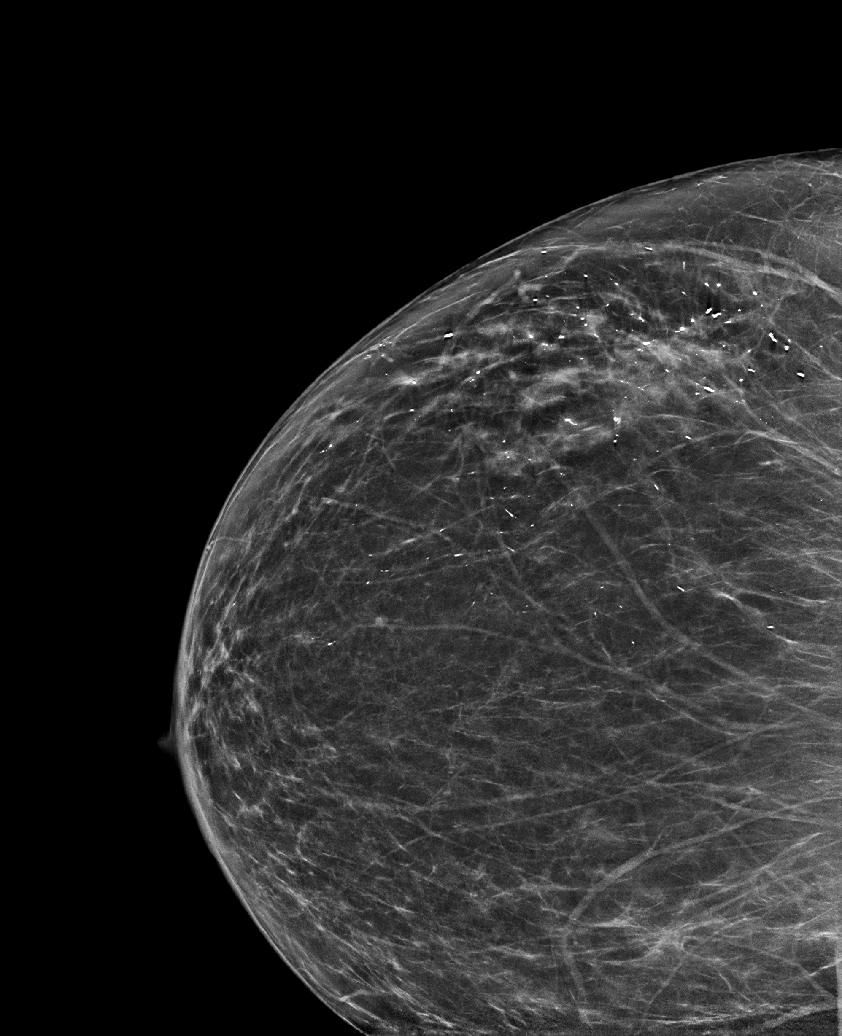

[R MLO synth-2D]
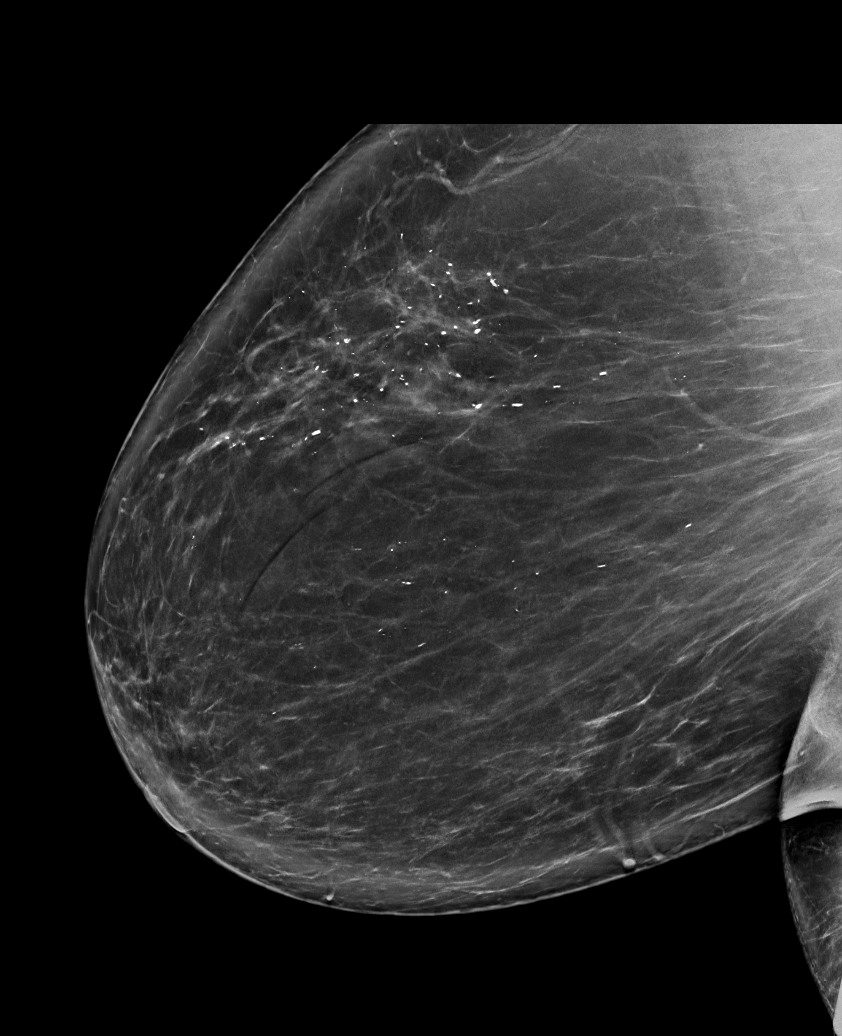

[L MLO synth-2D]
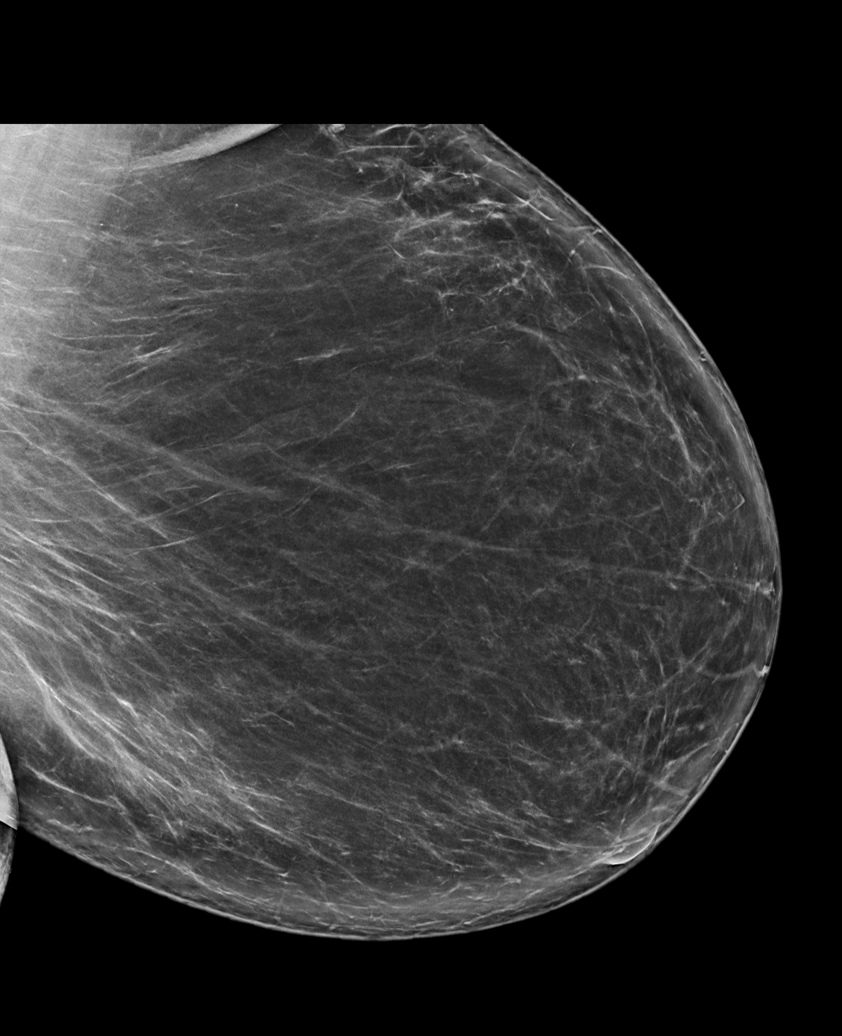

[6 of 36 positions shown; findings below may reference images not displayed]

ACR Breast Density Category b: There are scattered areas of
fibroglandular density.
FINDINGS: There are no findings suspicious for malignancy.
IMPRESSION: No mammographic evidence of malignancy. A result letter of this
screening mammogram will be mailed directly to the patient.

RECOMMENDATION:
Screening mammogram in one year. (Code:[id number]-O-LD2)

BI-RADS CATEGORY  1: Negative.

## 2022-12-01 ENCOUNTER — Other Ambulatory Visit: Payer: Self-pay | Admitting: Nurse Practitioner

## 2022-12-01 NOTE — Telephone Encounter (Signed)
Requested Prescriptions  Pending Prescriptions Disp Refills   levothyroxine (SYNTHROID) 100 MCG tablet [Pharmacy Med Name: LEVOTHYROXINE 100 MCG TABLET] 90 tablet 1    Sig: TAKE 1 TABLET BY MOUTH EVERY DAY     Endocrinology:  Hypothyroid Agents Passed - 12/01/2022  1:37 AM      Passed - TSH in normal range and within 360 days    TSH  Date Value Ref Range Status  07/13/2022 2.150 0.450 - 4.500 uIU/mL Final         Passed - Valid encounter within last 12 months    Recent Outpatient Visits           4 months ago Stage 3a chronic kidney disease (CKD) (HCC)   [place] [facility] [place], Jolene T, NP   10 months ago Stage 3a chronic kidney disease (CKD) (HCC)   [place] [facility] [place], Corrie Dandy T, NP   1 year ago Morbid obesity (HCC)   [place] [facility] [place], Corrie Dandy T, NP   2 years ago Morbid obesity (HCC)   [place] [facility] [place], Corrie Dandy T, NP   2 years ago Morbid obesity (HCC)   [place] [facility] [place], Dorie Rank, NP       Future Appointments             In 1 month Cannady, Dorie Rank, NP [facility]

## 2022-12-02 ENCOUNTER — Ambulatory Visit: Payer: Medicare Other

## 2022-12-14 ENCOUNTER — Ambulatory Visit
Admission: RE | Admit: 2022-12-14 | Discharge: 2022-12-14 | Disposition: A | Discharge status: Home / Self Care | Payer: Medicare Other | Source: Ambulatory Visit | Attending: Nurse Practitioner | Admitting: Nurse Practitioner

## 2022-12-14 DIAGNOSIS — Z1231 Encounter for screening mammogram for malignant neoplasm of breast: Secondary | ICD-10-CM

## 2022-12-16 ENCOUNTER — Telehealth: Payer: Self-pay | Admitting: *Deleted

## 2022-12-16 NOTE — Telephone Encounter (Signed)
  Chief Complaint: Results Symptoms: NA Frequency: NA Pertinent Negatives: Patient denies NA Disposition: [] ED /[] Urgent Care (no appt availability in office) / [] Appointment(In office/virtual)/ []  [place] [facility]/ [] Home Care/ [] Refused Recommended Disposition /[] [place] [facility]/ [x]  Follow-up with PCP Additional Notes:   Result notes read to pt, pt also reviewed in MyChart.  Pt questioning why F/U needed and can she go to [place] instead of [facility].  States can advise in MyChart. Please advise.       Good morning Kerry Phillips, your mammogram returned and looks like you will need further imaging.  Please call [place] to schedule: Please call to schedule your mammogram and/or bone density: [facility] at [facility] Address: [address], [place], [place] [address] Phone: [phone]    Imaging at [facility] [address] [place],  [place]  [address] Phone: [phone]

## 2022-12-16 NOTE — Telephone Encounter (Signed)
Noted  

## 2022-12-16 NOTE — Telephone Encounter (Signed)
Patient was notified and made aware of Kerry Phillips's recommendations. Patient says she had her imaging at [facility] and says she would like to go back there for repeat imaging. Patient was informed that they would reach out according to her imaging result and go over the findings and reach out to her to get scheduled. Patient was advised if she does not hear anything by the middle of next week to reach out and let our office know. Patient verbalized understanding.

## 2022-12-16 NOTE — Progress Notes (Signed)
Contacted via MyChart   Good morning Kerry Phillips, your mammogram returned and looks like you will need further imaging.  Please call [place] to schedule: Please call to schedule your mammogram and/or bone density: [facility] at [facility]  Address: [address], [place], [place] [address] Phone: [phone]  [facility] [address] [place],  [place]  [address] Phone: [phone]

## 2022-12-19 ENCOUNTER — Other Ambulatory Visit: Payer: Self-pay | Admitting: Nurse Practitioner

## 2022-12-19 DIAGNOSIS — R928 Other abnormal and inconclusive findings on diagnostic imaging of breast: Secondary | ICD-10-CM

## 2022-12-31 ENCOUNTER — Other Ambulatory Visit: Payer: Self-pay | Admitting: Nurse Practitioner

## 2022-12-31 ENCOUNTER — Ambulatory Visit
Admission: RE | Admit: 2022-12-31 | Discharge: 2022-12-31 | Disposition: A | Discharge status: Home / Self Care | Payer: Medicare Other | Source: Ambulatory Visit | Attending: Nurse Practitioner | Admitting: Nurse Practitioner

## 2022-12-31 DIAGNOSIS — R921 Mammographic calcification found on diagnostic imaging of breast: Secondary | ICD-10-CM

## 2022-12-31 DIAGNOSIS — R928 Other abnormal and inconclusive findings on diagnostic imaging of breast: Secondary | ICD-10-CM

## 2023-01-06 ENCOUNTER — Ambulatory Visit
Admission: RE | Admit: 2023-01-06 | Discharge: 2023-01-06 | Disposition: A | Discharge status: Home / Self Care | Payer: Medicare Other | Source: Ambulatory Visit | Attending: Nurse Practitioner | Admitting: Nurse Practitioner

## 2023-01-06 DIAGNOSIS — R921 Mammographic calcification found on diagnostic imaging of breast: Secondary | ICD-10-CM

## 2023-01-06 HISTORY — PX: BREAST BIOPSY: SHX20

## 2023-01-08 DIAGNOSIS — Z2821 Immunization not carried out because of patient refusal: Secondary | ICD-10-CM | POA: Insufficient documentation

## 2023-01-08 NOTE — Patient Instructions (Signed)
Be Involved in Caring For Your Health:  Taking Medications When medications are taken as directed, they can greatly improve your health. But if they are not taken as prescribed, they may not work. In some cases, not taking them correctly can be harmful. To help ensure your treatment remains effective and safe, understand your medications and how to take them. Bring your medications to each visit for review by your provider.  Your lab results, notes, and after visit summary will be available on My Chart. We strongly encourage you to use this feature. If lab results are abnormal the clinic will contact you with the appropriate steps. If the clinic does not contact you assume the results are satisfactory. You can always view your results on My Chart. If you have questions regarding your health or results, please contact the clinic during office hours. You can also ask questions on My Chart.  We at [facility] are grateful that you chose [place] to provide your care. We strive to provide evidence-based and compassionate care and are always looking for feedback. If you get a survey from the clinic please complete this so we can hear your opinions.  Healthy Eating, Adult Healthy eating may help you get and keep a healthy body weight, reduce the risk of chronic disease, and live a long and productive life. It is important to follow a healthy eating pattern. Your nutritional and calorie needs should be met mainly by different nutrient-rich foods. What are tips for following this plan? Reading food labels Read labels and choose the following: Reduced or low sodium products. Juices with 100% fruit juice. Foods with low saturated fats (<3 g per serving) and high polyunsaturated and monounsaturated fats. Foods with whole grains, such as whole wheat, cracked wheat, brown rice, and wild rice. Whole grains that are fortified with folic acid. This is recommended for females who are pregnant or who want to  become pregnant. Read labels and do not eat or drink the following: Foods or drinks with added sugars. These include foods that contain brown sugar, corn sweetener, corn syrup, dextrose, fructose, glucose, high-fructose corn syrup, honey, invert sugar, lactose, malt syrup, maltose, molasses, raw sugar, sucrose, trehalose, or turbinado sugar. Limit your intake of added sugars to less than 10% of your total daily calories. Do not eat more than the following amounts of added sugar per day: 6 teaspoons (25 g) for females. 9 teaspoons (38 g) for males. Foods that contain processed or refined starches and grains. Refined grain products, such as white flour, degermed cornmeal, white bread, and white rice. Shopping Choose nutrient-rich snacks, such as vegetables, whole fruits, and nuts. Avoid high-calorie and high-sugar snacks, such as potato chips, fruit snacks, and candy. Use oil-based dressings and spreads on foods instead of solid fats such as butter, margarine, sour cream, or cream cheese. Limit pre-made sauces, mixes, and "instant" products such as flavored rice, instant noodles, and ready-made pasta. Try more plant-protein sources, such as tofu, tempeh, black beans, edamame, lentils, nuts, and seeds. Explore eating plans such as the Mediterranean diet or vegetarian diet. Try heart-healthy dips made with beans and healthy fats like hummus and guacamole. Vegetables go great with these. Cooking Use oil to saut or stir-fry foods instead of solid fats such as butter, margarine, or lard. Try baking, boiling, grilling, or broiling instead of frying. Remove the fatty part of meats before cooking. Steam vegetables in water or broth. Meal planning  At meals, imagine dividing your plate into fourths: One-half of  your plate is fruits and vegetables. One-fourth of your plate is whole grains. One-fourth of your plate is protein, especially lean meats, poultry, eggs, tofu, beans, or nuts. Include low-fat  dairy as part of your daily diet. Lifestyle Choose healthy options in all settings, including home, work, school, restaurants, or stores. Prepare your food safely: Wash your hands after handling raw meats. Where you prepare food, keep surfaces clean by regularly washing with hot, soapy water. Keep raw meats separate from ready-to-eat foods, such as fruits and vegetables. Cook seafood, meat, poultry, and eggs to the recommended temperature. Get a food thermometer. Store foods at safe temperatures. In general: Keep cold foods at 48F (4.4C) or below. Keep hot foods at 148F (60C) or above. Keep your freezer at [facility] (-17.8C) or below. Foods are not safe to eat if they have been between the temperatures of 40-148F (4.4-60C) for more than 2 hours. What foods should I eat? Fruits Aim to eat 1-2 cups of fresh, canned (in natural juice), or frozen fruits each day. One cup of fruit equals 1 small apple, 1 large banana, 8 large strawberries, 1 cup (237 g) canned fruit,  cup (82 g) dried fruit, or 1 cup (240 mL) 100% juice. Vegetables Aim to eat 2-4 cups of fresh and frozen vegetables each day, including different varieties and colors. One cup of vegetables equals 1 cup (91 g) broccoli or cauliflower florets, 2 medium carrots, 2 cups (150 g) raw, leafy greens, 1 large tomato, 1 large bell pepper, 1 large sweet potato, or 1 medium white potato. Grains Aim to eat 5-10 ounce-equivalents of whole grains each day. Examples of 1 ounce-equivalent of grains include 1 slice of bread, 1 cup (40 g) ready-to-eat cereal, 3 cups (24 g) popcorn, or  cup (93 g) cooked rice. Meats and other proteins Try to eat 5-7 ounce-equivalents of protein each day. Examples of 1 ounce-equivalent of protein include 1 egg,  oz nuts (12 almonds, 24 pistachios, or 7 walnut halves), 1/4 cup (90 g) cooked beans, 6 tablespoons (90 g) hummus or 1 tablespoon (16 g) peanut butter. A cut of meat or fish that is the size of a deck of  cards is about 3-4 ounce-equivalents (85 g). Of the protein you eat each week, try to have at least 8 sounce (227 g) of seafood. This is about 2 servings per week. This includes salmon, trout, herring, sardines, and anchovies. Dairy Aim to eat 3 cup-equivalents of fat-free or low-fat dairy each day. Examples of 1 cup-equivalent of dairy include 1 cup (240 mL) milk, 8 ounces (250 g) yogurt, 1 ounces (44 g) natural cheese, or 1 cup (240 mL) fortified soy milk. Fats and oils Aim for about 5 teaspoons (21 g) of fats and oils per day. Choose monounsaturated fats, such as canola and olive oils, mayonnaise made with olive oil or avocado oil, avocados, peanut butter, and most nuts, or polyunsaturated fats, such as sunflower, corn, and soybean oils, walnuts, pine nuts, sesame seeds, sunflower seeds, and flaxseed. Beverages Aim for 6 eight-ounce glasses of water per day. Limit coffee to 3-5 eight-ounce cups per day. Limit caffeinated beverages that have added calories, such as soda and energy drinks. If you drink alcohol: Limit how much you have to: 0-1 drink a day if you are female. 0-2 drinks a day if you are female. Know how much alcohol is in your drink. In the [place] one drink is one 12 oz bottle of beer (355 mL), one 5 oz glass of wine (  148 mL), or one 1 oz glass of hard liquor (44 mL). Seasoning and other foods Try not to add too much salt to your food. Try using herbs and spices instead of salt. Try not to add sugar to food. This information is based on [place] nutrition guidelines. To learn more, visit DisposableN[employer].be. Exact amounts may vary. You may need different amounts. This information is not intended to replace advice given to you by your health care provider. Make sure you discuss any questions you have with your health care provider. Document Revised: 11/15/2021 Document Reviewed: 11/15/2021 Elsevier Patient Education  2024 [employer].

## 2023-01-09 ENCOUNTER — Other Ambulatory Visit: Payer: Self-pay | Admitting: Nurse Practitioner

## 2023-01-09 DIAGNOSIS — C50911 Malignant neoplasm of unspecified site of right female breast: Secondary | ICD-10-CM

## 2023-01-09 LAB — SURGICAL PATHOLOGY

## 2023-01-10 ENCOUNTER — Telehealth: Payer: Self-pay | Admitting: *Deleted

## 2023-01-10 ENCOUNTER — Telehealth: Payer: Self-pay | Admitting: Hematology and Oncology

## 2023-01-10 NOTE — Telephone Encounter (Signed)
LVM for patient to call me back in reference to [facility] appointment on 11/20, left my call back number

## 2023-01-10 NOTE — Telephone Encounter (Signed)
Spoke to patient to confirm upcoming morning [facility] clinic appointment  on 11/20, paperwork will be sent via e-mail.   Gave location and time, also informed patient that the surgeon's office would be calling as well to get information from them similar to the packet that they will be receiving so make sure to do both.  Reminded patient that all providers will be coming to the clinic to see them HERE and if they had any questions to not hesitate to reach back out to myself or their navigators.

## 2023-01-11 ENCOUNTER — Ambulatory Visit
Admission: RE | Admit: 2023-01-11 | Discharge: 2023-01-11 | Disposition: A | Discharge status: Home / Self Care | Payer: Medicare Other | Source: Ambulatory Visit | Attending: Nurse Practitioner | Admitting: Nurse Practitioner

## 2023-01-11 DIAGNOSIS — C50911 Malignant neoplasm of unspecified site of right female breast: Secondary | ICD-10-CM

## 2023-01-13 ENCOUNTER — Encounter: Payer: Self-pay | Admitting: Nurse Practitioner

## 2023-01-13 ENCOUNTER — Ambulatory Visit (INDEPENDENT_AMBULATORY_CARE_PROVIDER_SITE_OTHER): Payer: Medicare Other | Admitting: Nurse Practitioner

## 2023-01-13 VITALS — BP 137/83 | HR 94 | Temp 98.1°F | Ht 64.5 in | Wt 288.4 lb

## 2023-01-13 DIAGNOSIS — N1831 Chronic kidney disease, stage 3a: Secondary | ICD-10-CM

## 2023-01-13 DIAGNOSIS — I10 Essential (primary) hypertension: Secondary | ICD-10-CM

## 2023-01-13 DIAGNOSIS — I129 Hypertensive chronic kidney disease with stage 1 through stage 4 chronic kidney disease, or unspecified chronic kidney disease: Secondary | ICD-10-CM | POA: Diagnosis not present

## 2023-01-13 DIAGNOSIS — J45990 Exercise induced bronchospasm: Secondary | ICD-10-CM

## 2023-01-13 DIAGNOSIS — Z2821 Immunization not carried out because of patient refusal: Secondary | ICD-10-CM

## 2023-01-13 DIAGNOSIS — Z1211 Encounter for screening for malignant neoplasm of colon: Secondary | ICD-10-CM

## 2023-01-13 DIAGNOSIS — E7800 Pure hypercholesterolemia, unspecified: Secondary | ICD-10-CM

## 2023-01-13 DIAGNOSIS — Z6841 Body Mass Index (BMI) 40.0 and over, adult: Secondary | ICD-10-CM | POA: Diagnosis not present

## 2023-01-13 DIAGNOSIS — E063 Autoimmune thyroiditis: Secondary | ICD-10-CM

## 2023-01-13 MED ORDER — ALBUTEROL SULFATE HFA 108 (90 BASE) MCG/ACT IN AERS: 2.0000 | INHALATION_SPRAY | Freq: Four times a day (QID) | RESPIRATORY_TRACT | 2 refills | Status: AC | PRN

## 2023-01-13 NOTE — Assessment & Plan Note (Signed)
Stage 3a on labs.  Continue Olmesartan for kidney protection.  Educated her on kidney disease and risks for this.  Monitor closely.  Increase water intake.  Consider nephrology if worsening.

## 2023-01-13 NOTE — Assessment & Plan Note (Signed)
BMI 48.74 with HTN and HLD.  Recommended eating smaller high protein, low fat meals more frequently and exercising 30 mins a day 5 times a week with a goal of 10-15lb weight loss in the next 3 months. Patient voiced their understanding and motivation to adhere to these recommendations.

## 2023-01-13 NOTE — Assessment & Plan Note (Signed)
Chronic, ongoing.  Continue current medication regimen and adjust as needed.  Recent labs stable.  Return in 6 months.

## 2023-01-13 NOTE — Assessment & Plan Note (Signed)
Ongoing, continue supplement -- check BMP.

## 2023-01-13 NOTE — Assessment & Plan Note (Signed)
Refuses all vaccinations, have recommended these and educated.

## 2023-01-13 NOTE — Assessment & Plan Note (Signed)
Noted on labs recently with ASCVD 12.1%.  Continue diet focus and recheck fasting labs today.  She prefers not to start statin, but have had at length discussion with her recommendation to start one if levels remain high.

## 2023-01-13 NOTE — Assessment & Plan Note (Signed)
Chronic, stable.  BP stable in office today.  Recommend she monitor BP at least a few mornings a week at home and document.  DASH diet at home.  Continue current medication regimen and adjust as needed.  Urine ALB 150 on check May 2024, continue Olmesartan for kidney protection.  Labs today: BMP.  Return in 6 months.

## 2023-01-13 NOTE — Assessment & Plan Note (Signed)
Refer to morbid obesity plan of care. 

## 2023-01-13 NOTE — Assessment & Plan Note (Signed)
Exacerbated by recent weather, is not using Albuterol inhaler.  Recommend she use this as needed if notices wheezing + start taking Claritin or Allegra daily for next 7 to 14 days.  If any worsening symptoms to return to office.  May need daily inhaler in future.

## 2023-01-13 NOTE — Progress Notes (Signed)
BP 137/83 (BP Location: Left Arm, Cuff Size: Large)   Pulse 94   Temp 98.1 F (36.7 C) (Oral)   Ht 5' 4.5" (1.638 m)   Wt 288 lb 6.4 oz (130.8 kg)   SpO2 98%   BMI 48.74 kg/m    Subjective:    Patient ID: Kerry Phillips, female    DOB: March 25, 1955, 67 y.o.   MRN: [id number]  HPI: Kerry Phillips is a 67 y.o. female  Chief Complaint  Patient presents with   Asthma   Chronic Kidney Disease   Hyperlipidemia   Hypertension   Hypothyroidism   New diagnosis of breast cancer on 01/06/23, Grade I Invasive Ductal Carcinoma to right breast.  Is seeing breast cancer center in [place] next week for further discussion.  HYPERTENSION Taking Olmesartan-Amlodipine- HCTZ daily.  Calcium levels in 8-9 range, continues on supplements -- recent level was normal at 8.5.  Vitamin D is improving with supplements. Hypertension status: controlled  Satisfied with current treatment? yes Duration of hypertension: chronic BP monitoring frequency: sometimes BP range: <130/80 BP medication side effects:  no Medication compliance: good compliance Aspirin: no Recurrent headaches: no Visual changes: no Palpitations: no Dyspnea: no Chest pain: no Lower extremity edema: no Dizzy/lightheaded: no  The 10-year ASCVD risk score (Arnett DK, et al., 2019) is: 12.1%   Values used to calculate the score:     Age: 33 years     Sex: Female     Is Non-Hispanic African American: Yes     Diabetic: No     Tobacco smoker: No     Systolic Blood Pressure: 137 mmHg     Is BP treated: No     HDL Cholesterol: 48 mg/dL     Total Cholesterol: 210 mg/dL  CHRONIC KIDNEY DISEASE (3a) CKD status: stable Medications renally dose: yes Previous renal evaluation: no Pneumovax:   refuses Influenza Vaccine:   refuses    ASTHMA Exacerbated by weather change.  Has not used Albuterol. Asthma status: stable Satisfied with current treatment?: yes Albuterol/rescue inhaler frequency: not yet used Dyspnea frequency:  no Wheezing frequency: just started today Cough frequency: no Nocturnal symptom frequency: no Limitation of activity: no Current upper respiratory symptoms: no Triggers: weather changes and exercise Aerochamber/spacer use: no Visits to ER or Urgent Care in past year: no Pneumovax: Not up to Date Influenza: Not up to Date   HYPOTHYROIDISM Taking Levothyroxine 100 MCG daily. Thyroid control status:stable Satisfied with current treatment? yes Medication side effects: no Medication compliance: good compliance Etiology of hypothyroidism:  Recent dose adjustment: did adjust in April and repeat normal Fatigue: no Cold intolerance: no Heat intolerance: no Weight gain: no Weight loss: no Constipation: no Diarrhea/loose stools: no Palpitations: no Lower extremity edema: no Anxiety/depressed mood: no  Recently diagnosed with breast cancer, has a little anxiety.  Goes to see oncology on Thursday.    01/13/2023   10:16 AM 07/13/2022   10:12 AM 04/11/2022    8:46 AM 01/06/2022   11:10 AM 06/09/2021   10:43 AM  Depression screen PHQ 2/9  Decreased Interest 0 0 0 0 0  Down, Depressed, Hopeless 0 0 0 1 0  PHQ - 2 Score 0 0 0 1 0  Altered sleeping 0 0 0 1 1  Tired, decreased energy 0 0 0 1 1  Change in appetite 0 0 0 0 1  Feeling bad or failure about yourself  0 0 0 1 0  Trouble concentrating 0 0 0  0  Moving slowly or fidgety/restless 0 0 0 0 0  Suicidal thoughts 0 0 0 0 0  PHQ-9 Score 0 0 0 4 3  Difficult doing work/chores Not difficult at all Not difficult at all Not difficult at all Not difficult at all        01/13/2023   10:16 AM 07/13/2022   10:12 AM 01/06/2022   11:10 AM 06/09/2021   10:44 AM  GAD 7 : Generalized Anxiety Score  Nervous, Anxious, on Edge 0 0 1 0  Control/stop worrying 0 0 1 0  Worry too much - different things 0 0 1 0  Trouble relaxing 0 0 1 0  Restless 0 0 1 0  Easily annoyed or irritable 0 0 1 0  Afraid - awful might happen 0 0 1 0  Total GAD 7  Score 0 0 7 0  Anxiety Difficulty Not difficult at all  Not difficult at all Not difficult at all   Relevant past medical, surgical, family and social history reviewed and updated as indicated. Interim medical history since our last visit reviewed. Allergies and medications reviewed and updated.  Review of Systems  Constitutional:  Negative for activity change, appetite change, diaphoresis, fatigue and fever.  Respiratory:  Positive for wheezing. Negative for cough, chest tightness and shortness of breath.   Cardiovascular:  Negative for chest pain, palpitations and leg swelling.  Gastrointestinal: Negative.   Endocrine: Negative for cold intolerance and heat intolerance.  Neurological: Negative.   Psychiatric/Behavioral:  Negative for decreased concentration, self-injury, sleep disturbance and suicidal ideas. The patient is nervous/anxious.     Per HPI unless specifically indicated above     Objective:    BP 137/83 (BP Location: Left Arm, Cuff Size: Large)   Pulse 94   Temp 98.1 F (36.7 C) (Oral)   Ht 5' 4.5" (1.638 m)   Wt 288 lb 6.4 oz (130.8 kg)   SpO2 98%   BMI 48.74 kg/m   Wt Readings from Last 3 Encounters:  01/13/23 288 lb 6.4 oz (130.8 kg)  07/13/22 287 lb 4.8 oz (130.3 kg)  04/11/22 287 lb (130.2 kg)    Physical Exam Vitals and nursing note reviewed.  Constitutional:      General: She is awake. She is not in acute distress.    Appearance: She is well-developed and well-groomed. She is obese. She is not ill-appearing or toxic-appearing.  HENT:     Head: Normocephalic.     Right Ear: Hearing normal.     Left Ear: Hearing normal.  Eyes:     General: Lids are normal.        Right eye: No discharge.        Left eye: No discharge.     Conjunctiva/sclera: Conjunctivae normal.     Pupils: Pupils are equal, round, and reactive to light.  Neck:     Thyroid: No thyromegaly.     Vascular: No carotid bruit.  Cardiovascular:     Rate and Rhythm: Normal rate and  regular rhythm.     Heart sounds: Normal heart sounds. No murmur heard.    No gallop.  Pulmonary:     Effort: Pulmonary effort is normal. No accessory muscle usage or respiratory distress.     Breath sounds: Wheezing present. No decreased breath sounds or rhonchi.     Comments: Intermittent expiratory wheezes noted.  No rhonchi. Abdominal:     General: Bowel sounds are normal.     Palpations: Abdomen is soft.  Musculoskeletal:  Cervical back: Normal range of motion and neck supple.     Right lower leg: No edema.     Left lower leg: No edema.  Lymphadenopathy:     Cervical: No cervical adenopathy.  Skin:    General: Skin is warm and dry.  Neurological:     Mental Status: She is alert and oriented to person, place, and time.  Psychiatric:        Attention and Perception: Attention normal.        Mood and Affect: Mood normal.        Speech: Speech normal.        Behavior: Behavior normal. Behavior is cooperative.        Thought Content: Thought content normal.    Results for orders placed or performed during the hospital encounter of 01/06/23  Surgical pathology  Result Value Ref Range   SURGICAL PATHOLOGY      SURGICAL PATHOLOGY [facility] [address], [address] [place], [place] [address] Telephone [phone] or [phone] Fax [phone]  REPORT OF SURGICAL PATHOLOGY   Accession #: [id number] Patient Name: SNEHA, HASHIMOTO Visit # : [id number]  MRN: [id number] Physician: Amie Portland DOB/Age 17-Apr-1955 (Age: 51) Gender: F Collected Date: 01/06/2023 Received Date: 01/06/2023  FINAL DIAGNOSIS       1. Breast, right, needle core biopsy, upper outer quadrant, ribbon clip :       FOCAL ATYPICAL DUCTAL HYPERPLASIA (ADH)      FIBROADENOMATOID CHANGE WITH CALCIFICATIONS      FIBROCYSTIC CHANGES WITH CALCIFICATIONS      SEE NOTE       2. Breast, right, needle core biopsy, upper outer quadrant anterior aspect, coil clip :       INVASIVE  DUCTAL CARCINOMA, SEE NOTE      DUCTAL CARCINOMA IN SITU, CRIBRIFORM, LOW TO INTERMEDIATE NUCLEAR GRADE, WITH      NECROSIS      TUBULE FORMATION: SCORE 1      NUCLEAR PLEOMORPHISM: SCORE 2      MITOTIC COUNT:  SCORE 1      TOTAL SCORE: 4      OVERALL GRADE: 1      LYMPHOVASCULAR INVASION: NOT IDENTIFIED      CANCER LENGTH: 0.3 CM      CALCIFICATIONS: PRESENT IN DCIS      OTHER FINDINGS: NONE      SEE NOTE       Diagnosis Note : & 2.Dr.Lu reviewed parts 1 and 2 of this case and concurs with      the interpretation.A breast prognostic profile (ER, PR, Ki-67 and HER2) is      pending and will be reported in an addendum.[facility] was      notified on 01/09/2023.      ELECTRONIC SIGNATURE : Jacelyn Grip, John, Pathologist, Electronic Signature  MICROSCOPIC DESCRIPTION  CASE COMMENTS STAINS USED IN DIAGNOSIS: H&E-2 H&E-3 H&E-4 H&E H&E-2 H&E-3 H&E-4 H&E H&E-2 H&E-3 H&E-4 H&E H&E-2 H&E-3 H&E-4 H&E *RECUT 1 SLIDE Universal Negative Control-DAB Universal Negative Control-DAB Universal Negative Control-DAB Stains used in diagnosis 2 Her2 by IHC, 2 ER-ACIS, 2 KI-67-ACIS, 2 PR-ACIS IHC scores are reported using ASCO/CAP scoring criteria.  An IH C Score of 0 or 1+  is NEGATIVE for HER2, 3+ is POSITIVE for HER2, and 2+ is EQUIVOCAL. Equivocal results are reflexed to either FISH or IHC testing. Specimens are fixed in 10% Neutral Buffered Formalin for at least 6 hours and up  to 72 hours. These tests have not be validated on decalcified tissue.  Results should be interpreted with caution given the possibility of false negative results on decalcified specimens. Antibody Clone for HER2 is 4B5 (PATHWAY). Some of these immunohistochemical stains may have been developed and the performance characteristics determined by [facility].  Some may not have been cleared or approved by the U.S. Food and Drug Administration.  The FDA has determined that such  clearance or approval is not necessary.  This test is used for clinical purposes.  It should not be regarded as investigational or for research.  This laboratory is certified under the Clinical Laboratory Improvement Amendments of 1988 (CLIA-88) as qualified to per form high complexity clinical laboratory testing. Estrogen receptor (6F11), immunohistochemical stains are performed on formalin fixed, paraffin embedded tissue using a 3,3"-diaminobenzidine (DAB) chromogen and Leica Bond Autostainer System.  The staining intensity of the nucleus is scored manually and is reported as the percentage of tumor cell nuclei demonstrating specific nuclear staining.Specimens are fixed in 10% Neutral Buffered Formalin for at least 6 hours and up to 72 hours.  These tests have not be validated on decalcified tissue.  Results should be interpreted with caution given the possibility of false negative results on decalcified specimens. Ki-67 (MM1), immunohistochemical stains are performed on formalin fixed, paraffin embedded tissue using a 3,3"-diaminobenzidine (DAB) chromogen and Leica Bond Autostainer System.  The staining intensity of the nucleus is scored manually and is reported as the percentage of tumor cell nuclei demonstrating specific nuclea r staining.Specimens are fixed in 10% Neutral Buffered Formalin for at least 6 hours and up to 72 hours. These tests have not be validated on decalcified tissue.  Results should be interpreted with caution given the possibility of false negative results on decalcified specimens. PR progesterone receptor (16), immunohistochemical stains are performed on formalin fixed, paraffin embedded tissue using a 3,3"-diaminobenzidine (DAB) chromogen and Leica Bond Autostainer System.  The staining intensity of the nucleus is scored manually and is reported as the percentage of tumor cell nuclei demonstrating specific nuclear staining.Specimens are fixed in 10% Neutral  Buffered Formalin for at least 6 hours and up to 72 hours. These tests have not be validated on decalcified tissue.  Results should be interpreted with caution given the possibility of false negative results on decalcified specimens.  ADDENDUM 2B) Breast, right, needle core biopsy, upper outer quadrant anterior aspect,  coil clip PROGNOSTIC INDICATORS  Results: IMMUNOHISTOCHEMICAL AND MORPHOMETRIC ANALYSIS PERFORMED MANUALLY The tumor cells are NEGATIVE for Her2 (1+). Estrogen Receptor:  95%, POSITIVE, STRONG STAINING INTENSITY Progesterone Receptor:  20%, POSITIVE, STRONG STAINING INTENSITY Proliferation Marker Ki67:  2% REFERENCE RANGE ESTROGEN RECEPTOR NEGATIVE     0% POSITIVE       =>1% REFERENCE RANGE PROGESTERONE RECEPTOR NEGATIVE     0% POSITIVE        =>1% All controls stained appropriately [employer] [employer], [place]ha[place], Sports administrator, International aid/development worker ( Signed [id number])   CLINICAL HISTORY  SPECIMEN(S) OBTAINED 1. Breast, right, needle core biopsy, Upper Outer Quadrant, Ribbon Clip 2. Breast, right, needle core biopsy, Upper Outer Quadrant Anterior Aspect, Coil Clip  SPECIMEN COMMENTS: 1. TIF: 11:01 AM, CIT 3 minutes; 15cm span of indeterminate calcifications; many appear as benign secretory calcifications 2. TIF: 11:19 AM, CIT 3 minutes SPECIMEN CLINICAL INFORMATION: 1.  Benign vs carcinoma    Gross Description 1. Received in formalin in a radial specimen container, labeled "RT breast calcifications", is a 3.0 x 3.0 x 0.4 cm  aggregate of fibrofatty tissue fragments.Container subunits A-C and E-G are indicated to contain calcifications and are submitted entirely in block 1A.The remaining tissue is submitted entirely in block 1B.      TIF: 11:01 a.m. on 01/06/23      CIT: 3 minutes      Biopsy clip: Ribbon 2. Received in formalin, labeled "RT breast calcifications to", is a 3.0 x 2.0 x 0.4 cm aggregate of fibrofatty tissue fragments.Container subunits F and G  are indicated to contain calcifications and are submitted entirely in block 2A.The remaining tissue is submitted entirely in block 2B.      TIF: 11:19 a.m. on 01/06/23      CIT: 3 minutes      Biopsy clip: Coil      SMB      01/06/2023        Report signed out from the following location(s) [place]. [facility] [address], [place] [address] CLIA #:  [id number]  [facility] [address] [place], [place] [address] CLIA #: [id number]       Assessment & Plan:   Problem List Items Addressed This Visit       Cardiovascular and Mediastinum   Essential hypertension    Chronic, stable.  BP stable in office today.  Recommend she monitor BP at least a few mornings a week at home and document.  DASH diet at home.  Continue current medication regimen and adjust as needed.  Urine ALB 150 on check May 2024, continue Olmesartan for kidney protection.  Labs today: BMP.  Return in 6 months.         Respiratory   Exercise-induced asthma    Exacerbated by recent weather, is not using Albuterol inhaler.  Recommend she use this as needed if notices wheezing + start taking Claritin or Allegra daily for next 7 to 14 days.  If any worsening symptoms to return to office.  May need daily inhaler in future.      Relevant Medications   albuterol (VENTOLIN HFA) 108 (90 Base) MCG/ACT inhaler     Endocrine   Hashimoto's thyroiditis    Chronic, ongoing.  Continue current medication regimen and adjust as needed.  Recent labs stable.  Return in 6 months.          Genitourinary   Stage 3a chronic kidney disease (CKD) (HCC) - Primary    Stage 3a on labs.  Continue Olmesartan for kidney protection.  Educated her on kidney disease and risks for this.  Monitor closely.  Increase water intake.  Consider nephrology if worsening.      Relevant Orders   Basic metabolic panel     Other   BMI 45.0-49.9, adult (HCC)    Refer to morbid obesity plan of care.        Elevated LDL cholesterol level    Noted on labs recently with ASCVD 12.1%.  Continue diet focus and recheck fasting labs today.  She prefers not to start statin, but have had at length discussion with her recommendation to start one if levels remain high.      Relevant Orders   Lipid Panel w/o Chol/HDL Ratio   Hypocalcemia    Ongoing, continue supplement -- check BMP.      Relevant Orders   Basic metabolic panel   Morbid obesity (HCC)    BMI 48.74 with HTN and HLD.  Recommended eating smaller high protein, low fat meals more frequently and exercising 30 mins  a day 5 times a week with a goal of 10-15lb weight loss in the next 3 months. Patient voiced their understanding and motivation to adhere to these recommendations.       No vaccination-pt refuse    Refuses all vaccinations, have recommended these and educated.      Other Visit Diagnoses     Screening for colon cancer       GI referral placed.   Relevant Orders   Ambulatory referral to Gastroenterology        Follow up plan: Return in about 6 months (around 07/13/2023) for Annual Physical after 07/13/23.

## 2023-01-14 LAB — LIPID PANEL W/O CHOL/HDL RATIO
Cholesterol, Total: 258 mg/dL — ABNORMAL HIGH (ref 100–199)
HDL: 63 mg/dL (ref >39)
LDL Chol Calc (NIH): 178 mg/dL — ABNORMAL HIGH (ref 0–99)
Triglycerides: 96 mg/dL (ref 0–149)
VLDL Cholesterol Cal: 17 mg/dL (ref 5–40)

## 2023-01-14 LAB — BASIC METABOLIC PANEL
BUN/Creatinine Ratio: 18 (ref 12–28)
BUN: 19 mg/dL (ref 8–27)
CO2: 25 mmol/L (ref 20–29)
Calcium: 9 mg/dL (ref 8.7–10.3)
Chloride: 100 mmol/L (ref 96–106)
Creatinine, Ser: 1.03 mg/dL — ABNORMAL HIGH (ref 0.57–1.00)
Glucose: 81 mg/dL (ref 70–99)
Potassium: 3.9 mmol/L (ref 3.5–5.2)
Sodium: 142 mmol/L (ref 134–144)
eGFR: 59 mL/min/{1.73_m2} — ABNORMAL LOW (ref >59)

## 2023-01-15 ENCOUNTER — Other Ambulatory Visit: Payer: Self-pay | Admitting: Nurse Practitioner

## 2023-01-15 MED ORDER — ROSUVASTATIN CALCIUM 10 MG PO TABS: 10.0000 mg | ORAL_TABLET | Freq: Every day | ORAL | 4 refills | Status: DC

## 2023-01-15 NOTE — Progress Notes (Signed)
Contacted via MyChart The 10-year ASCVD risk score (Arnett DK, et al., 2019) is: 14.3%   Values used to calculate the score:     Age: 67 years     Sex: Female     Is Non-Hispanic African American: Yes     Diabetic: No     Tobacco smoker: No     Systolic Blood Pressure: 137 mmHg     Is BP treated: No     HDL Cholesterol: 63 mg/dL     Total Cholesterol: 258 mg/dL   Good morning Kerry Phillips, your labs have returned: - Kidney function continues to show chronic kidney disease stage 3a, ensure to continue to drink plenty of water daily and avoid Ibuprofen products. - Your lipid panel continues to show elevation in LDL, bad cholesterol, and total cholesterol.  I do recommend you start a statin as current risk for stroke or heart attack is elevated.  I will send in Rosuvastatin at low dose, try this and let me know if any side effects.  We will recheck levels next visit.  Any questions? Keep being stellar!!  Thank you for allowing me to participate in your care.  I appreciate you. Kindest regards, Apoorva Bugay

## 2023-01-16 ENCOUNTER — Encounter: Payer: Self-pay | Admitting: *Deleted

## 2023-01-16 DIAGNOSIS — C50411 Malignant neoplasm of upper-outer quadrant of right female breast: Secondary | ICD-10-CM | POA: Insufficient documentation

## 2023-01-17 NOTE — Progress Notes (Signed)
Radiation Oncology         ([phone]) [phone] ________________________________  Initial Outpatient Consultation  Name: Kerry Phillips MRN: [id number]  Date: 01/18/2023  DOB: 10-28-1955  MV:HQIONGE, Kerry Rank, NP  Kerry Bouillon, MD   REFERRING PHYSICIAN: Harriette Bouillon, MD  DIAGNOSIS: No diagnosis found.   Cancer Staging  No matching staging information was found for the patient.   Stage *** Right Breast UOQ, Invasive and in situ ductal carcinoma, ER+ / PR+ / Her2-, Grade 1  CHIEF COMPLAINT: Here to discuss management of right breast cancer  HISTORY OF PRESENT ILLNESS::Kerry Phillips is a 67 y.o. female who presented with a right breast abnormality on the following imaging: bilateral screening mammogram on the date of 12/14/22. No symptoms, if any, were reported at that time. Right breast diagnostic mammogram on 12/31/22 further revealed indeterminate calcifications throughout the outer right breast spanning approximately 15 cm.    Biopsy of the upper outer more anterior right breast on date of 01/06/23 showed grade 1 invasive ductal carcinoma measuring 3 mm in the greatest linear extent of the sample with low-intermediate grade DCIS and necrosis.  ER status: 95% positive and PR status 20% positive, both with strong staining intensity; Proliferation marker Ki67 at 2%; Her2 status negative; Grade 1. An additional biopsy was obtained from the upper outer right breast which showed focal ADH and calcifications.   She most recently presented for a right axillary [place] on 01/11/23 which demonstrated no evidence of right axillary lymphadenopathy.   ***  PREVIOUS RADIATION THERAPY: No  PAST MEDICAL HISTORY:  has a past medical history of Arthritis, Dysphagia (01/17/2017), History of blood transfusion, and Hypothyroidism.    PAST SURGICAL HISTORY: Past Surgical History:  Procedure Laterality Date   ABDOMINAL HYSTERECTOMY     ARTHROSCOPIC HAGLUNDS REPAIR Right 01/04/2019   Procedure: ENDOSCOPIC  RESECTION HAGLUNDS DEFORMITY;  Surgeon: Nadara Mustard, MD;  Location: [facility] OR;  Service: Orthopedics;  Laterality: Right;   BREAST BIOPSY Right 01/06/2023   MM RT BREAST BX W LOC DEV 1ST LESION IMAGE BX SPEC STEREO GUIDE 01/06/2023 [facility]   BREAST BIOPSY Right 01/06/2023   MM RT BREAST BX W LOC DEV EA AD LESION IMG BX SPEC STEREO GUIDE 01/06/2023 [facility]   COLONOSCOPY     THYROIDECTOMY     90% removed    FAMILY HISTORY: family history includes Breast cancer in her sister; Cancer in her father; Hypertension in her daughter, mother, sister, and son; Lupus in her sister; Stroke in her sister.  SOCIAL HISTORY:  reports that she has never smoked. She has never used smokeless tobacco. She reports that she does not drink alcohol and does not use drugs.  ALLERGIES: Patient has no known allergies.  MEDICATIONS:  Current Outpatient Medications  Medication Sig Dispense Refill   albuterol (VENTOLIN HFA) 108 (90 Base) MCG/ACT inhaler Inhale 2 puffs into the lungs every 6 (six) hours as needed. 18 g 2   calcitRIOL (ROCALTROL) 0.5 MCG capsule Take 1 capsule (0.5 mcg total) by mouth 2 (two) times daily. 180 capsule 4   Iron-Vitamins (GERITOL TONIC PO) Take 5 mLs by mouth daily.     levothyroxine (SYNTHROID) 100 MCG tablet TAKE 1 TABLET BY MOUTH EVERY DAY 90 tablet 1   Olmesartan-amLODIPine-HCTZ 40-5-12.5 MG TABS TAKE 1 TABLET BY MOUTH ONCE DAILY 90 tablet 4   rosuvastatin (CRESTOR) 10 MG tablet Take 1 tablet (10 mg total) by mouth daily. 90 tablet 4   No current facility-administered medications for  this encounter.    REVIEW OF SYSTEMS: As above in HPI.   PHYSICAL EXAM:  vitals were not taken for this visit.   General: Alert and oriented, in no acute distress HEENT: Head is normocephalic. Extraocular movements are intact. Oropharynx is clear. Neck: Neck is supple, no palpable cervical or supraclavicular lymphadenopathy. Heart: Regular in rate and rhythm with no murmurs, rubs, or  gallops. Chest: Clear to auscultation bilaterally, with no rhonchi, wheezes, or rales. Abdomen: Soft, nontender, nondistended, with no rigidity or guarding. Extremities: No cyanosis or edema. Lymphatics: see Neck Exam Skin: No concerning lesions. Musculoskeletal: symmetric strength and muscle tone throughout. Neurologic: Cranial nerves II through XII are grossly intact. No obvious focalities. Speech is fluent. Coordination is intact. Psychiatric: Judgment and insight are intact. Affect is appropriate. Breasts: *** . No other palpable masses appreciated in the breasts or axillae *** .    ECOG = ***  0 - Asymptomatic (Fully active, able to carry on all predisease activities without restriction)  1 - Symptomatic but completely ambulatory (Restricted in physically strenuous activity but ambulatory and able to carry out work of a light or sedentary nature. For example, light housework, office work)  2 - Symptomatic, <50% in bed during the day (Ambulatory and capable of all self care but unable to carry out any work activities. Up and about more than 50% of waking hours)  3 - Symptomatic, >50% in bed, but not bedbound (Capable of only limited self-care, confined to bed or chair 50% or more of waking hours)  4 - Bedbound (Completely disabled. Cannot carry on any self-care. Totally confined to bed or chair)  5 - Death   Kerry Phillips MM, Kerry Phillips, Kerry Phillips, et al. [phone]). "Toxicity and response criteria of the [facility]". Am. Evlyn Clines. Oncol. 5 (6): 649-55   LABORATORY DATA:  Lab Results  Component Value Date   WBC 5.4 07/13/2022   HGB 12.4 07/13/2022   HCT 37.2 07/13/2022   MCV 85 07/13/2022   PLT 284 07/13/2022   CMP     Component Value Date/Time   NA 142 01/13/2023 1045   K 3.9 01/13/2023 1045   CL 100 01/13/2023 1045   CO2 25 01/13/2023 1045   GLUCOSE 81 01/13/2023 1045   GLUCOSE 97 01/04/2019 0632   BUN 19 01/13/2023 1045   CREATININE 1.03 (H) 01/13/2023  1045   CALCIUM 9.0 01/13/2023 1045   PROT 6.8 07/13/2022 0947   ALBUMIN 4.4 07/13/2022 0947   AST 14 07/13/2022 0947   ALT 7 07/13/2022 0947   ALKPHOS 53 07/13/2022 0947   BILITOT 0.4 07/13/2022 0947   GFRNONAA 62 04/16/2020 1150   GFRAA 72 04/16/2020 1150         RADIOGRAPHY: [place] AXILLA RIGHT  Result Date: 01/11/2023 CLINICAL DATA:  Patient underwent stereotactic core needle biopsy of the anterior-posterior extents right breast calcifications, biopsies revealing invasive ductal carcinoma, DCIS and atypical ductal hyperplasia. Patient presents for ultrasound evaluation of the right axilla prior to treatment assessment. EXAM: ULTRASOUND OF THE RIGHT AXILLA COMPARISON:  Previous exam(s). FINDINGS: Ultrasound of the right axilla is performed, showing normal lymph nodes. No enlarged or abnormal lymph nodes. IMPRESSION: 1. No right axilla lymphadenopathy. RECOMMENDATION: 1. Treatment as planned for the known right breast carcinoma. I have discussed the findings and recommendations with the patient. If applicable, a reminder letter will be sent to the patient regarding the next appointment. BI-RADS CATEGORY  6: Known biopsy-proven malignancy. Electronically Signed   By: Onalee Hua  Ormond M.D.   On: 01/11/2023 13:52   MM RT BREAST BX W LOC DEV 1ST LESION IMAGE BX SPEC STEREO GUIDE  Addendum Date: 01/09/2023   ADDENDUM REPORT: 01/09/2023 15:13 ADDENDUM: ADDENDUM Pathology revealed FOCAL ATYPICAL DUCTAL HYPERPLASIA (ADH), FIBROADENOMATOID CHANGE WITH CALCIFICATIONS, FIBROCYSTIC CHANGES WITH CALCIFICATIONS of the RIGHT breast, posterior, upper outer quadrant, (ribbon clip). This was found to be concordant by Dr. Amie Portland, with excision recommended. Pathology revealed GRADE I INVASIVE DUCTAL CARCINOMA, DUCTAL CARCINOMA IN SITU, CRIBRIFORM, LOW TO INTERMEDIATE NUCLEAR GRADE, WITH NECROSIS, CALCIFICATIONS: PRESENT IN DCIS of the RIGHT breast, anterior, upper outer quadrant, (coil clip). This was found to be  concordant by Dr. Amie Portland. Pathology results were discussed with the patient by telephone. The patient reported doing well after the biopsies with tenderness at the sites. Post biopsy instructions and care were reviewed and questions were answered. The patient was encouraged to call [facility] for any additional concerns. My direct phone number was provided. The patient was referred to [facility] on January 18, 2023. The patient is scheduled for a RIGHT axillary ultrasound on January 11, 2023 due to the invasive component of the diagnosis. Pathology results reported by Rene Kocher, RN on 01/09/2023. Electronically Signed   By: Amie Portland M.D.   On: 01/09/2023 15:13   Result Date: 01/09/2023 CLINICAL DATA:  Patient presents for stereotactic core needle biopsy of the anterior-posterior extents right breast calcifications. EXAM: RIGHT BREAST STEREOTACTIC CORE NEEDLE BIOPSY: 2 RIGHT BREAST STEREOTACTIC CORE NEEDLE BIOPSIES PERFORMED COMPARISON:  Previous exam(s). FINDINGS: The patient and I discussed the procedure of stereotactic-guided biopsy including benefits and alternatives. We discussed the high likelihood of a successful procedure. We discussed the risks of the procedure including infection, bleeding, tissue injury, clip migration, and inadequate sampling. Informed written consent was given. The usual time out protocol was performed immediately prior to the procedure. Biopsy #1: Posterior extent of upper outer quadrant calcifications. Using sterile technique and 1% Lidocaine as local anesthetic, under stereotactic guidance, a 9 gauge vacuum assisted device was used to perform core needle biopsy of calcifications in the posterior, upper outer quadrant of the right breast using a superior approach. Specimen radiograph was performed showing calcifications for which biopsy was performed. Specimens  with calcifications are identified for pathology. Lesion quadrant: Upper outer quadrant At the conclusion of the procedure, a ribbon shaped tissue marker clip was deployed into the biopsy cavity. Biopsy #2: Anterior extent of upper outer quadrant calcifications. Using sterile technique and 1% Lidocaine as local anesthetic, under stereotactic guidance, a 9 gauge vacuum assisted device was used to perform core needle biopsy of calcifications in the anterior, upper outer quadrant the right breast using a superior approach. Specimen radiograph was performed showing calcifications for which biopsy was performed. Specimens with calcifications are identified for pathology. Lesion quadrant: Upper outer quadrant At the conclusion of the procedure, a coil shaped tissue marker clip was deployed into the biopsy cavity. Follow-up 2-view mammogram was performed and dictated separately. IMPRESSION: Stereotactic-guided biopsy of the anterior and posterior extent upper outer quadrant right breast calcifications. No apparent complications. Electronically Signed: By: Amie Portland M.D. On: 01/06/2023 11:22   MM RT BREAST BX W LOC DEV EA AD LESION IMG BX SPEC STEREO GUIDE  Addendum Date: 01/09/2023   ADDENDUM REPORT: 01/09/2023 15:13 ADDENDUM: ADDENDUM Pathology revealed FOCAL ATYPICAL DUCTAL HYPERPLASIA (ADH), FIBROADENOMATOID CHANGE WITH CALCIFICATIONS, FIBROCYSTIC CHANGES WITH CALCIFICATIONS of the  RIGHT breast, posterior, upper outer quadrant, (ribbon clip). This was found to be concordant by Dr. Amie Portland, with excision recommended. Pathology revealed GRADE I INVASIVE DUCTAL CARCINOMA, DUCTAL CARCINOMA IN SITU, CRIBRIFORM, LOW TO INTERMEDIATE NUCLEAR GRADE, WITH NECROSIS, CALCIFICATIONS: PRESENT IN DCIS of the RIGHT breast, anterior, upper outer quadrant, (coil clip). This was found to be concordant by Dr. Amie Portland. Pathology results were discussed with the patient by telephone. The patient reported doing well after  the biopsies with tenderness at the sites. Post biopsy instructions and care were reviewed and questions were answered. The patient was encouraged to call [facility] for any additional concerns. My direct phone number was provided. The patient was referred to [facility] on January 18, 2023. The patient is scheduled for a RIGHT axillary ultrasound on January 11, 2023 due to the invasive component of the diagnosis. Pathology results reported by Rene Kocher, RN on 01/09/2023. Electronically Signed   By: Amie Portland M.D.   On: 01/09/2023 15:13   Result Date: 01/09/2023 CLINICAL DATA:  Patient presents for stereotactic core needle biopsy of the anterior-posterior extents right breast calcifications. EXAM: RIGHT BREAST STEREOTACTIC CORE NEEDLE BIOPSY: 2 RIGHT BREAST STEREOTACTIC CORE NEEDLE BIOPSIES PERFORMED COMPARISON:  Previous exam(s). FINDINGS: The patient and I discussed the procedure of stereotactic-guided biopsy including benefits and alternatives. We discussed the high likelihood of a successful procedure. We discussed the risks of the procedure including infection, bleeding, tissue injury, clip migration, and inadequate sampling. Informed written consent was given. The usual time out protocol was performed immediately prior to the procedure. Biopsy #1: Posterior extent of upper outer quadrant calcifications. Using sterile technique and 1% Lidocaine as local anesthetic, under stereotactic guidance, a 9 gauge vacuum assisted device was used to perform core needle biopsy of calcifications in the posterior, upper outer quadrant of the right breast using a superior approach. Specimen radiograph was performed showing calcifications for which biopsy was performed. Specimens with calcifications are identified for pathology. Lesion quadrant: Upper outer quadrant At the conclusion of the procedure, a ribbon  shaped tissue marker clip was deployed into the biopsy cavity. Biopsy #2: Anterior extent of upper outer quadrant calcifications. Using sterile technique and 1% Lidocaine as local anesthetic, under stereotactic guidance, a 9 gauge vacuum assisted device was used to perform core needle biopsy of calcifications in the anterior, upper outer quadrant the right breast using a superior approach. Specimen radiograph was performed showing calcifications for which biopsy was performed. Specimens with calcifications are identified for pathology. Lesion quadrant: Upper outer quadrant At the conclusion of the procedure, a coil shaped tissue marker clip was deployed into the biopsy cavity. Follow-up 2-view mammogram was performed and dictated separately. IMPRESSION: Stereotactic-guided biopsy of the anterior and posterior extent upper outer quadrant right breast calcifications. No apparent complications. Electronically Signed: By: Amie Portland M.D. On: 01/06/2023 11:22   MM CLIP PLACEMENT RIGHT  Result Date: 01/06/2023 CLINICAL DATA:  Assess post biopsy marker clip placements following stereotactic core needle biopsy of right breast calcifications. EXAM: 3D DIAGNOSTIC RIGHT MAMMOGRAM POST STEREOTACTIC BIOPSY COMPARISON:  Previous exam(s). FINDINGS: 3D Mammographic images were obtained following stereotactic guided biopsy of the anterior and posterior aspects a large band right breast calcifications. The ribbon shaped post biopsy marker clip lies along the posterior aspect of the calcifications and the coil shaped post biopsy marker clip lies along the more anterior aspect. IMPRESSION: Appropriate positioning of the ribbon and  coil shaped biopsy marking clips at the site of biopsy in the posterior, upper outer and anterior lateral aspect of the right breast respectively. Final Assessment: Post Procedure Mammograms for Marker Placement Electronically Signed   By: Amie Portland M.D.   On: 01/06/2023 11:32   MM Digital  Diagnostic Unilat R  Result Date: 12/31/2022 CLINICAL DATA:  Screening recall for right breast calcifications. EXAM: DIGITAL DIAGNOSTIC UNILATERAL RIGHT MAMMOGRAM TECHNIQUE: Right digital diagnostic mammography was performed. COMPARISON:  Previous exam(s). ACR Breast Density Category b: There are scattered areas of fibroglandular density. FINDINGS: Spot compression magnification views were performed of the central to outer right breast. There are diffuse calcifications spanning approximately 15 cm, the majority of which are large and rod-like demonstrating imaging features most suggestive of secretory calcifications, although some of these are somewhat more punctate and amorphous and asymmetric when compared to the contralateral breast. IMPRESSION: Indeterminate calcifications throughout the outer right breast spanning 15 cm. RECOMMENDATION: Recommend 2 site stereotactic guided biopsy of the calcifications in the right breast, to include the more anterior and more posterior extent of these calcifications. I have discussed the findings and recommendations with the patient. If applicable, a reminder letter will be sent to the patient regarding the next appointment. BI-RADS CATEGORY  4: Suspicious. Electronically Signed   By: Edwin Cap M.D.   On: 12/31/2022 11:11      IMPRESSION/PLAN: ***   It was a pleasure meeting the patient today. We discussed the risks, benefits, and side effects of radiotherapy. I recommend radiotherapy to the *** to reduce her risk of locoregional recurrence by 2/3.  We discussed that radiation would take approximately *** weeks to complete and that I would give the patient a few weeks to heal following surgery before starting treatment planning. *** If chemotherapy were to be given, this would precede radiotherapy. We spoke about acute effects including skin irritation and fatigue as well as much less common late effects including internal organ injury or irritation. We spoke about  the latest technology that is used to minimize the risk of late effects for patients undergoing radiotherapy to the breast or chest wall. No guarantees of treatment were given. The patient is enthusiastic about proceeding with treatment. I look forward to participating in the patient's care.  I will await her referral back to me for postoperative follow-up and eventual CT simulation/treatment planning.  On date of service, in total, I spent *** minutes on this encounter. Patient was seen in person.   __________________________________________   Lonie Peak, MD  This document serves as a record of services personally performed by Lonie Peak, MD. It was created on her behalf by Neena Rhymes, a trained medical scribe. The creation of this record is based on the scribe's personal observations and the provider's statements to them. This document has been checked and approved by the attending provider.

## 2023-01-18 ENCOUNTER — Ambulatory Visit: Payer: Self-pay | Admitting: Surgery

## 2023-01-18 ENCOUNTER — Inpatient Hospital Stay: Payer: Medicare Other | Attending: Hematology and Oncology

## 2023-01-18 ENCOUNTER — Ambulatory Visit: Payer: Medicare Other | Attending: Surgery | Admitting: Physical Therapy

## 2023-01-18 ENCOUNTER — Encounter: Payer: Self-pay | Admitting: Genetic Counselor

## 2023-01-18 ENCOUNTER — Inpatient Hospital Stay (HOSPITAL_BASED_OUTPATIENT_CLINIC_OR_DEPARTMENT_OTHER): Payer: Medicare Other | Admitting: Hematology and Oncology

## 2023-01-18 ENCOUNTER — Encounter: Payer: Self-pay | Admitting: *Deleted

## 2023-01-18 ENCOUNTER — Encounter: Payer: Self-pay | Admitting: Radiation Oncology

## 2023-01-18 ENCOUNTER — Other Ambulatory Visit: Payer: Self-pay

## 2023-01-18 ENCOUNTER — Inpatient Hospital Stay: Payer: Medicare Other | Admitting: Licensed Clinical Social Worker

## 2023-01-18 ENCOUNTER — Inpatient Hospital Stay: Payer: Medicare Other | Admitting: Genetic Counselor

## 2023-01-18 ENCOUNTER — Encounter: Payer: Self-pay | Admitting: Physical Therapy

## 2023-01-18 ENCOUNTER — Ambulatory Visit
Admission: RE | Admit: 2023-01-18 | Discharge: 2023-01-18 | Disposition: A | Discharge status: Home / Self Care | Payer: Medicare Other | Source: Ambulatory Visit | Attending: Radiation Oncology | Admitting: Radiation Oncology

## 2023-01-18 VITALS — BP 152/96 | HR 107 | Temp 97.5°F | Resp 16 | Wt 288.2 lb

## 2023-01-18 DIAGNOSIS — C50411 Malignant neoplasm of upper-outer quadrant of right female breast: Secondary | ICD-10-CM | POA: Diagnosis present

## 2023-01-18 DIAGNOSIS — Z170 Estrogen receptor positive status [ER+]: Secondary | ICD-10-CM | POA: Diagnosis not present

## 2023-01-18 DIAGNOSIS — Z803 Family history of malignant neoplasm of breast: Secondary | ICD-10-CM

## 2023-01-18 DIAGNOSIS — Z860100 Personal history of colon polyps, unspecified: Secondary | ICD-10-CM | POA: Insufficient documentation

## 2023-01-18 DIAGNOSIS — K219 Gastro-esophageal reflux disease without esophagitis: Secondary | ICD-10-CM | POA: Insufficient documentation

## 2023-01-18 DIAGNOSIS — R293 Abnormal posture: Secondary | ICD-10-CM | POA: Diagnosis not present

## 2023-01-18 DIAGNOSIS — C50911 Malignant neoplasm of unspecified site of right female breast: Secondary | ICD-10-CM

## 2023-01-18 LAB — CMP (CANCER CENTER ONLY)
ALT: 6 U/L (ref 0–44)
AST: 11 U/L — ABNORMAL LOW (ref 15–41)
Albumin: 4.4 g/dL (ref 3.5–5.0)
Alkaline Phosphatase: 49 U/L (ref 38–126)
Anion gap: 6 (ref 5–15)
BUN: 15 mg/dL (ref 8–23)
CO2: 32 mmol/L (ref 22–32)
Calcium: 9.4 mg/dL (ref 8.9–10.3)
Chloride: 100 mmol/L (ref 98–111)
Creatinine: 1.12 mg/dL — ABNORMAL HIGH (ref 0.44–1.00)
GFR, Estimated: 54 mL/min — ABNORMAL LOW (ref >60)
Glucose, Bld: 84 mg/dL (ref 70–99)
Potassium: 3.8 mmol/L (ref 3.5–5.1)
Sodium: 138 mmol/L (ref 135–145)
Total Bilirubin: 0.6 mg/dL (ref <1.2)
Total Protein: 7.7 g/dL (ref 6.5–8.1)

## 2023-01-18 LAB — CBC WITH DIFFERENTIAL (CANCER CENTER ONLY)
Abs Immature Granulocytes: 0.03 10*3/uL (ref 0.00–0.07)
Basophils Absolute: 0 10*3/uL (ref 0.0–0.1)
Basophils Relative: 1 %
Eosinophils Absolute: 0.1 10*3/uL (ref 0.0–0.5)
Eosinophils Relative: 2 %
HCT: 37.6 % (ref 36.0–46.0)
Hemoglobin: 12.5 g/dL (ref 12.0–15.0)
Immature Granulocytes: 0 %
Lymphocytes Relative: 24 %
Lymphs Abs: 1.7 10*3/uL (ref 0.7–4.0)
MCH: 28.3 pg (ref 26.0–34.0)
MCHC: 33.2 g/dL (ref 30.0–36.0)
MCV: 85.3 fL (ref 80.0–100.0)
Monocytes Absolute: 0.4 10*3/uL (ref 0.1–1.0)
Monocytes Relative: 6 %
Neutro Abs: 4.6 10*3/uL (ref 1.7–7.7)
Neutrophils Relative %: 67 %
Platelet Count: 308 10*3/uL (ref 150–400)
RBC: 4.41 MIL/uL (ref 3.87–5.11)
RDW: 13.8 % (ref 11.5–15.5)
WBC Count: 6.9 10*3/uL (ref 4.0–10.5)
nRBC: 0 % (ref 0.0–0.2)

## 2023-01-18 LAB — GENETIC SCREENING ORDER

## 2023-01-18 NOTE — Progress Notes (Deleted)
[facility]  [phone] Fax:([phone]   MEDICAL ONCOLOGY - INITIAL CONSULTATION  Referral MD  Reason for Referral: ***  No chief complaint on file. : ***  HPI: ***     Past Medical History:  Diagnosis Date   Arthritis    Dysphagia 01/17/2017   History of blood transfusion    with childbirth   Hypothyroidism    surgical  :   Past Surgical History:  Procedure Laterality Date   ABDOMINAL HYSTERECTOMY     ARTHROSCOPIC HAGLUNDS REPAIR Right 01/04/2019   Procedure: ENDOSCOPIC RESECTION HAGLUNDS DEFORMITY;  Surgeon: Nadara Mustard, MD;  Location: [facility] OR;  Service: Orthopedics;  Laterality: Right;   BREAST BIOPSY Right 01/06/2023   MM RT BREAST BX W LOC DEV 1ST LESION IMAGE BX SPEC STEREO GUIDE 01/06/2023 [facility]   BREAST BIOPSY Right 01/06/2023   MM RT BREAST BX W LOC DEV EA AD LESION IMG BX SPEC STEREO GUIDE 01/06/2023 [facility]   COLONOSCOPY     THYROIDECTOMY     90% removed  :   Current Outpatient Medications  Medication Sig Dispense Refill   calcitRIOL (ROCALTROL) 0.5 MCG capsule Take 1 capsule (0.5 mcg total) by mouth 2 (two) times daily. 180 capsule 4   levothyroxine (SYNTHROID) 100 MCG tablet TAKE 1 TABLET BY MOUTH EVERY DAY 90 tablet 1   Olmesartan-amLODIPine-HCTZ 40-5-12.5 MG TABS TAKE 1 TABLET BY MOUTH ONCE DAILY 90 tablet 4   albuterol (VENTOLIN HFA) 108 (90 Base) MCG/ACT inhaler Inhale 2 puffs into the lungs every 6 (six) hours as needed. 18 g 2   Iron-Vitamins (GERITOL TONIC PO) Take 5 mLs by mouth daily.     rosuvastatin (CRESTOR) 10 MG tablet Take 1 tablet (10 mg total) by mouth daily. 90 tablet 4   No current facility-administered medications for this visit.     No Known Allergies:   Family History  Problem Relation Age of Onset   Hypertension Mother    Cancer Father        Lung   Stroke Sister    Hypertension Sister    Hypertension Daughter    Hypertension Son    Lupus Sister    Breast  cancer Sister   :   Social History   Socioeconomic History   Marital status: Married    Spouse name: Not on file   Number of children: Not on file   Years of education: Not on file   Highest education level: Not on file  Occupational History   Not on file  Tobacco Use   Smoking status: Never   Smokeless tobacco: Never  Vaping Use   Vaping status: Never Used  Substance and Sexual Activity   Alcohol use: No   Drug use: Never   Sexual activity: Yes  Other Topics Concern   Not on file  Social History Narrative   Not on file   Social Determinants of Health   Financial Resource Strain: Low Risk  (04/11/2022)   Overall Financial Resource Strain (CARDIA)    Difficulty of Paying Living Expenses: Not hard at all  Food Insecurity: No Food Insecurity (04/11/2022)   Hunger Vital Sign    Worried About Running Out of Food in the Last Year: Never true    Ran Out of Food in the Last Year: Never true  Transportation Needs: No Transportation Needs (04/11/2022)   PRAPARE - Administrator, Civil Service (Medical): No    Lack of Transportation (Non-Medical):  No  Physical Activity: Insufficiently Active (04/11/2022)   Exercise Vital Sign    Days of Exercise per Week: 3 days    Minutes of Exercise per Session: 30 min  Stress: No Stress Concern Present (04/11/2022)   [employer] of Occupational Health - Occupational Stress Questionnaire    Feeling of Stress : Not at all  Social Connections: Socially Integrated (04/11/2022)   Social Connection and Isolation Panel [NHANES]    Frequency of Communication with Friends and Family: More than three times a week    Frequency of Social Gatherings with Friends and Family: More than three times a week    Attends Religious Services: More than 4 times per year    Active Member of [employer] or Organizations: Yes    Attends Banker Meetings: More than 4 times per year    Marital Status: Married  Catering manager Violence: Not At  Risk (04/11/2022)   Humiliation, Afraid, Rape, and Kick questionnaire    Fear of Current or Ex-Partner: No    Emotionally Abused: No    Physically Abused: No    Sexually Abused: No  :  {Ros - complete:30496}  Exam: @IPVITALS @  General:  well-nourished in no acute distress.  Eyes:  no scleral icterus.  ENT:  There were no oropharyngeal lesions.  Neck was without thyromegaly.  Lymphatics:  Negative cervical, supraclavicular or axillary adenopathy.  Respiratory: lungs were clear bilaterally without wheezing or crackles.  Cardiovascular:  Regular rate and rhythm, S1/S2, without murmur, rub or gallop.  There was no pedal edema.  GI:  abdomen was soft, flat, nontender, nondistended, without organomegaly.  Muscoloskeletal:  no spinal tenderness of palpation of vertebral spine.  Skin exam was without echymosis, petichae.  Neuro exam was nonfocal.  Patient was able to get on and off exam table without assistance.  Gait was normal.  Patient was alerted and oriented.  Attention was good.   Language was appropriate.  Mood was normal without depression.  Speech was not pressured.  Thought content was not tangential.     Lab Results  Component Value Date   WBC 6.9 01/18/2023   HGB 12.5 01/18/2023   HCT 37.6 01/18/2023   PLT 308 01/18/2023   GLUCOSE 81 01/13/2023   CHOL 258 (H) 01/13/2023   TRIG 96 01/13/2023   HDL 63 01/13/2023   LDLCALC 178 (H) 01/13/2023   ALT 7 07/13/2022   AST 14 07/13/2022   NA 142 01/13/2023   K 3.9 01/13/2023   CL 100 01/13/2023   CREATININE 1.03 (H) 01/13/2023   BUN 19 01/13/2023   CO2 25 01/13/2023    [place] AXILLA RIGHT  Result Date: 01/11/2023 CLINICAL DATA:  Patient underwent stereotactic core needle biopsy of the anterior-posterior extents right breast calcifications, biopsies revealing invasive ductal carcinoma, DCIS and atypical ductal hyperplasia. Patient presents for ultrasound evaluation of the right axilla prior to treatment assessment. EXAM: ULTRASOUND OF THE  RIGHT AXILLA COMPARISON:  Previous exam(s). FINDINGS: Ultrasound of the right axilla is performed, showing normal lymph nodes. No enlarged or abnormal lymph nodes. IMPRESSION: 1. No right axilla lymphadenopathy. RECOMMENDATION: 1. Treatment as planned for the known right breast carcinoma. I have discussed the findings and recommendations with the patient. If applicable, a reminder letter will be sent to the patient regarding the next appointment. BI-RADS CATEGORY  6: Known biopsy-proven malignancy. Electronically Signed   By: Amie Portland M.D.   On: 01/11/2023 13:52   MM RT BREAST BX W LOC DEV 1ST LESION  IMAGE BX SPEC STEREO GUIDE  Addendum Date: 01/09/2023   ADDENDUM REPORT: 01/09/2023 15:13 ADDENDUM: ADDENDUM Pathology revealed FOCAL ATYPICAL DUCTAL HYPERPLASIA (ADH), FIBROADENOMATOID CHANGE WITH CALCIFICATIONS, FIBROCYSTIC CHANGES WITH CALCIFICATIONS of the RIGHT breast, posterior, upper outer quadrant, (ribbon clip). This was found to be concordant by Dr. Amie Portland, with excision recommended. Pathology revealed GRADE I INVASIVE DUCTAL CARCINOMA, DUCTAL CARCINOMA IN SITU, CRIBRIFORM, LOW TO INTERMEDIATE NUCLEAR GRADE, WITH NECROSIS, CALCIFICATIONS: PRESENT IN DCIS of the RIGHT breast, anterior, upper outer quadrant, (coil clip). This was found to be concordant by Dr. Amie Portland. Pathology results were discussed with the patient by telephone. The patient reported doing well after the biopsies with tenderness at the sites. Post biopsy instructions and care were reviewed and questions were answered. The patient was encouraged to call [facility] for any additional concerns. My direct phone number was provided. The patient was referred to [facility] on January 18, 2023. The patient is scheduled for a RIGHT axillary ultrasound on January 11, 2023 due to the invasive component of the diagnosis.  Pathology results reported by Rene Kocher, RN on 01/09/2023. Electronically Signed   By: Amie Portland M.D.   On: 01/09/2023 15:13   Result Date: 01/09/2023 CLINICAL DATA:  Patient presents for stereotactic core needle biopsy of the anterior-posterior extents right breast calcifications. EXAM: RIGHT BREAST STEREOTACTIC CORE NEEDLE BIOPSY: 2 RIGHT BREAST STEREOTACTIC CORE NEEDLE BIOPSIES PERFORMED COMPARISON:  Previous exam(s). FINDINGS: The patient and I discussed the procedure of stereotactic-guided biopsy including benefits and alternatives. We discussed the high likelihood of a successful procedure. We discussed the risks of the procedure including infection, bleeding, tissue injury, clip migration, and inadequate sampling. Informed written consent was given. The usual time out protocol was performed immediately prior to the procedure. Biopsy #1: Posterior extent of upper outer quadrant calcifications. Using sterile technique and 1% Lidocaine as local anesthetic, under stereotactic guidance, a 9 gauge vacuum assisted device was used to perform core needle biopsy of calcifications in the posterior, upper outer quadrant of the right breast using a superior approach. Specimen radiograph was performed showing calcifications for which biopsy was performed. Specimens with calcifications are identified for pathology. Lesion quadrant: Upper outer quadrant At the conclusion of the procedure, a ribbon shaped tissue marker clip was deployed into the biopsy cavity. Biopsy #2: Anterior extent of upper outer quadrant calcifications. Using sterile technique and 1% Lidocaine as local anesthetic, under stereotactic guidance, a 9 gauge vacuum assisted device was used to perform core needle biopsy of calcifications in the anterior, upper outer quadrant the right breast using a superior approach. Specimen radiograph was performed showing calcifications for which biopsy was performed. Specimens with calcifications are identified  for pathology. Lesion quadrant: Upper outer quadrant At the conclusion of the procedure, a coil shaped tissue marker clip was deployed into the biopsy cavity. Follow-up 2-view mammogram was performed and dictated separately. IMPRESSION: Stereotactic-guided biopsy of the anterior and posterior extent upper outer quadrant right breast calcifications. No apparent complications. Electronically Signed: By: Amie Portland M.D. On: 01/06/2023 11:22   MM RT BREAST BX W LOC DEV EA AD LESION IMG BX SPEC STEREO GUIDE  Addendum Date: 01/09/2023   ADDENDUM REPORT: 01/09/2023 15:13 ADDENDUM: ADDENDUM Pathology revealed FOCAL ATYPICAL DUCTAL HYPERPLASIA (ADH), FIBROADENOMATOID CHANGE WITH CALCIFICATIONS, FIBROCYSTIC CHANGES WITH CALCIFICATIONS of the RIGHT breast, posterior, upper outer quadrant, (ribbon clip). This was found to be concordant by Dr. Amie Portland,  with excision recommended. Pathology revealed GRADE I INVASIVE DUCTAL CARCINOMA, DUCTAL CARCINOMA IN SITU, CRIBRIFORM, LOW TO INTERMEDIATE NUCLEAR GRADE, WITH NECROSIS, CALCIFICATIONS: PRESENT IN DCIS of the RIGHT breast, anterior, upper outer quadrant, (coil clip). This was found to be concordant by Dr. Amie Portland. Pathology results were discussed with the patient by telephone. The patient reported doing well after the biopsies with tenderness at the sites. Post biopsy instructions and care were reviewed and questions were answered. The patient was encouraged to call [facility] for any additional concerns. My direct phone number was provided. The patient was referred to [facility] on January 18, 2023. The patient is scheduled for a RIGHT axillary ultrasound on January 11, 2023 due to the invasive component of the diagnosis. Pathology results reported by Rene Kocher, RN on 01/09/2023. Electronically Signed   By: Amie Portland M.D.   On: 01/09/2023 15:13    Result Date: 01/09/2023 CLINICAL DATA:  Patient presents for stereotactic core needle biopsy of the anterior-posterior extents right breast calcifications. EXAM: RIGHT BREAST STEREOTACTIC CORE NEEDLE BIOPSY: 2 RIGHT BREAST STEREOTACTIC CORE NEEDLE BIOPSIES PERFORMED COMPARISON:  Previous exam(s). FINDINGS: The patient and I discussed the procedure of stereotactic-guided biopsy including benefits and alternatives. We discussed the high likelihood of a successful procedure. We discussed the risks of the procedure including infection, bleeding, tissue injury, clip migration, and inadequate sampling. Informed written consent was given. The usual time out protocol was performed immediately prior to the procedure. Biopsy #1: Posterior extent of upper outer quadrant calcifications. Using sterile technique and 1% Lidocaine as local anesthetic, under stereotactic guidance, a 9 gauge vacuum assisted device was used to perform core needle biopsy of calcifications in the posterior, upper outer quadrant of the right breast using a superior approach. Specimen radiograph was performed showing calcifications for which biopsy was performed. Specimens with calcifications are identified for pathology. Lesion quadrant: Upper outer quadrant At the conclusion of the procedure, a ribbon shaped tissue marker clip was deployed into the biopsy cavity. Biopsy #2: Anterior extent of upper outer quadrant calcifications. Using sterile technique and 1% Lidocaine as local anesthetic, under stereotactic guidance, a 9 gauge vacuum assisted device was used to perform core needle biopsy of calcifications in the anterior, upper outer quadrant the right breast using a superior approach. Specimen radiograph was performed showing calcifications for which biopsy was performed. Specimens with calcifications are identified for pathology. Lesion quadrant: Upper outer quadrant At the conclusion of the procedure, a coil shaped tissue marker clip was  deployed into the biopsy cavity. Follow-up 2-view mammogram was performed and dictated separately. IMPRESSION: Stereotactic-guided biopsy of the anterior and posterior extent upper outer quadrant right breast calcifications. No apparent complications. Electronically Signed: By: Amie Portland M.D. On: 01/06/2023 11:22   MM CLIP PLACEMENT RIGHT  Result Date: 01/06/2023 CLINICAL DATA:  Assess post biopsy marker clip placements following stereotactic core needle biopsy of right breast calcifications. EXAM: 3D DIAGNOSTIC RIGHT MAMMOGRAM POST STEREOTACTIC BIOPSY COMPARISON:  Previous exam(s). FINDINGS: 3D Mammographic images were obtained following stereotactic guided biopsy of the anterior and posterior aspects a large band right breast calcifications. The ribbon shaped post biopsy marker clip lies along the posterior aspect of the calcifications and the coil shaped post biopsy marker clip lies along the more anterior aspect. IMPRESSION: Appropriate positioning of the ribbon and coil shaped biopsy marking clips at the site of biopsy in the posterior, upper outer and anterior lateral  aspect of the right breast respectively. Final Assessment: Post Procedure Mammograms for Marker Placement Electronically Signed   By: Amie Portland M.D.   On: 01/06/2023 11:32   MM Digital Diagnostic Unilat R  Result Date: 12/31/2022 CLINICAL DATA:  Screening recall for right breast calcifications. EXAM: DIGITAL DIAGNOSTIC UNILATERAL RIGHT MAMMOGRAM TECHNIQUE: Right digital diagnostic mammography was performed. COMPARISON:  Previous exam(s). ACR Breast Density Category b: There are scattered areas of fibroglandular density. FINDINGS: Spot compression magnification views were performed of the central to outer right breast. There are diffuse calcifications spanning approximately 15 cm, the majority of which are large and rod-like demonstrating imaging features most suggestive of secretory calcifications, although some of these are  somewhat more punctate and amorphous and asymmetric when compared to the contralateral breast. IMPRESSION: Indeterminate calcifications throughout the outer right breast spanning 15 cm. RECOMMENDATION: Recommend 2 site stereotactic guided biopsy of the calcifications in the right breast, to include the more anterior and more posterior extent of these calcifications. I have discussed the findings and recommendations with the patient. If applicable, a reminder letter will be sent to the patient regarding the next appointment. BI-RADS CATEGORY  4: Suspicious. Electronically Signed   By: Edwin Cap M.D.   On: 12/31/2022 11:11    Pathology:***  [place] AXILLA RIGHT  Result Date: 01/11/2023 CLINICAL DATA:  Patient underwent stereotactic core needle biopsy of the anterior-posterior extents right breast calcifications, biopsies revealing invasive ductal carcinoma, DCIS and atypical ductal hyperplasia. Patient presents for ultrasound evaluation of the right axilla prior to treatment assessment. EXAM: ULTRASOUND OF THE RIGHT AXILLA COMPARISON:  Previous exam(s). FINDINGS: Ultrasound of the right axilla is performed, showing normal lymph nodes. No enlarged or abnormal lymph nodes. IMPRESSION: 1. No right axilla lymphadenopathy. RECOMMENDATION: 1. Treatment as planned for the known right breast carcinoma. I have discussed the findings and recommendations with the patient. If applicable, a reminder letter will be sent to the patient regarding the next appointment. BI-RADS CATEGORY  6: Known biopsy-proven malignancy. Electronically Signed   By: Amie Portland M.D.   On: 01/11/2023 13:52   MM RT BREAST BX W LOC DEV 1ST LESION IMAGE BX SPEC STEREO GUIDE  Addendum Date: 01/09/2023   ADDENDUM REPORT: 01/09/2023 15:13 ADDENDUM: ADDENDUM Pathology revealed FOCAL ATYPICAL DUCTAL HYPERPLASIA (ADH), FIBROADENOMATOID CHANGE WITH CALCIFICATIONS, FIBROCYSTIC CHANGES WITH CALCIFICATIONS of the RIGHT breast, posterior, upper outer  quadrant, (ribbon clip). This was found to be concordant by Dr. Amie Portland, with excision recommended. Pathology revealed GRADE I INVASIVE DUCTAL CARCINOMA, DUCTAL CARCINOMA IN SITU, CRIBRIFORM, LOW TO INTERMEDIATE NUCLEAR GRADE, WITH NECROSIS, CALCIFICATIONS: PRESENT IN DCIS of the RIGHT breast, anterior, upper outer quadrant, (coil clip). This was found to be concordant by Dr. Amie Portland. Pathology results were discussed with the patient by telephone. The patient reported doing well after the biopsies with tenderness at the sites. Post biopsy instructions and care were reviewed and questions were answered. The patient was encouraged to call [facility] for any additional concerns. My direct phone number was provided. The patient was referred to [facility] on January 18, 2023. The patient is scheduled for a RIGHT axillary ultrasound on January 11, 2023 due to the invasive component of the diagnosis. Pathology results reported by Rene Kocher, RN on 01/09/2023. Electronically Signed   By: Amie Portland M.D.   On: 01/09/2023 15:13   Result Date: 01/09/2023 CLINICAL DATA:  Patient presents for stereotactic core  needle biopsy of the anterior-posterior extents right breast calcifications. EXAM: RIGHT BREAST STEREOTACTIC CORE NEEDLE BIOPSY: 2 RIGHT BREAST STEREOTACTIC CORE NEEDLE BIOPSIES PERFORMED COMPARISON:  Previous exam(s). FINDINGS: The patient and I discussed the procedure of stereotactic-guided biopsy including benefits and alternatives. We discussed the high likelihood of a successful procedure. We discussed the risks of the procedure including infection, bleeding, tissue injury, clip migration, and inadequate sampling. Informed written consent was given. The usual time out protocol was performed immediately prior to the procedure. Biopsy #1: Posterior extent of upper outer quadrant calcifications.  Using sterile technique and 1% Lidocaine as local anesthetic, under stereotactic guidance, a 9 gauge vacuum assisted device was used to perform core needle biopsy of calcifications in the posterior, upper outer quadrant of the right breast using a superior approach. Specimen radiograph was performed showing calcifications for which biopsy was performed. Specimens with calcifications are identified for pathology. Lesion quadrant: Upper outer quadrant At the conclusion of the procedure, a ribbon shaped tissue marker clip was deployed into the biopsy cavity. Biopsy #2: Anterior extent of upper outer quadrant calcifications. Using sterile technique and 1% Lidocaine as local anesthetic, under stereotactic guidance, a 9 gauge vacuum assisted device was used to perform core needle biopsy of calcifications in the anterior, upper outer quadrant the right breast using a superior approach. Specimen radiograph was performed showing calcifications for which biopsy was performed. Specimens with calcifications are identified for pathology. Lesion quadrant: Upper outer quadrant At the conclusion of the procedure, a coil shaped tissue marker clip was deployed into the biopsy cavity. Follow-up 2-view mammogram was performed and dictated separately. IMPRESSION: Stereotactic-guided biopsy of the anterior and posterior extent upper outer quadrant right breast calcifications. No apparent complications. Electronically Signed: By: Amie Portland M.D. On: 01/06/2023 11:22   MM RT BREAST BX W LOC DEV EA AD LESION IMG BX SPEC STEREO GUIDE  Addendum Date: 01/09/2023   ADDENDUM REPORT: 01/09/2023 15:13 ADDENDUM: ADDENDUM Pathology revealed FOCAL ATYPICAL DUCTAL HYPERPLASIA (ADH), FIBROADENOMATOID CHANGE WITH CALCIFICATIONS, FIBROCYSTIC CHANGES WITH CALCIFICATIONS of the RIGHT breast, posterior, upper outer quadrant, (ribbon clip). This was found to be concordant by Dr. Amie Portland, with excision recommended. Pathology revealed GRADE I  INVASIVE DUCTAL CARCINOMA, DUCTAL CARCINOMA IN SITU, CRIBRIFORM, LOW TO INTERMEDIATE NUCLEAR GRADE, WITH NECROSIS, CALCIFICATIONS: PRESENT IN DCIS of the RIGHT breast, anterior, upper outer quadrant, (coil clip). This was found to be concordant by Dr. Amie Portland. Pathology results were discussed with the patient by telephone. The patient reported doing well after the biopsies with tenderness at the sites. Post biopsy instructions and care were reviewed and questions were answered. The patient was encouraged to call [facility] for any additional concerns. My direct phone number was provided. The patient was referred to [facility] on January 18, 2023. The patient is scheduled for a RIGHT axillary ultrasound on January 11, 2023 due to the invasive component of the diagnosis. Pathology results reported by Rene Kocher, RN on 01/09/2023. Electronically Signed   By: Amie Portland M.D.   On: 01/09/2023 15:13   Result Date: 01/09/2023 CLINICAL DATA:  Patient presents for stereotactic core needle biopsy of the anterior-posterior extents right breast calcifications. EXAM: RIGHT BREAST STEREOTACTIC CORE NEEDLE BIOPSY: 2 RIGHT BREAST STEREOTACTIC CORE NEEDLE BIOPSIES PERFORMED COMPARISON:  Previous exam(s). FINDINGS: The patient and I discussed the procedure of stereotactic-guided biopsy including benefits and alternatives. We discussed the high likelihood of a successful procedure.  We discussed the risks of the procedure including infection, bleeding, tissue injury, clip migration, and inadequate sampling. Informed written consent was given. The usual time out protocol was performed immediately prior to the procedure. Biopsy #1: Posterior extent of upper outer quadrant calcifications. Using sterile technique and 1% Lidocaine as local anesthetic, under stereotactic guidance, a 9 gauge vacuum assisted device was used  to perform core needle biopsy of calcifications in the posterior, upper outer quadrant of the right breast using a superior approach. Specimen radiograph was performed showing calcifications for which biopsy was performed. Specimens with calcifications are identified for pathology. Lesion quadrant: Upper outer quadrant At the conclusion of the procedure, a ribbon shaped tissue marker clip was deployed into the biopsy cavity. Biopsy #2: Anterior extent of upper outer quadrant calcifications. Using sterile technique and 1% Lidocaine as local anesthetic, under stereotactic guidance, a 9 gauge vacuum assisted device was used to perform core needle biopsy of calcifications in the anterior, upper outer quadrant the right breast using a superior approach. Specimen radiograph was performed showing calcifications for which biopsy was performed. Specimens with calcifications are identified for pathology. Lesion quadrant: Upper outer quadrant At the conclusion of the procedure, a coil shaped tissue marker clip was deployed into the biopsy cavity. Follow-up 2-view mammogram was performed and dictated separately. IMPRESSION: Stereotactic-guided biopsy of the anterior and posterior extent upper outer quadrant right breast calcifications. No apparent complications. Electronically Signed: By: Amie Portland M.D. On: 01/06/2023 11:22   MM CLIP PLACEMENT RIGHT  Result Date: 01/06/2023 CLINICAL DATA:  Assess post biopsy marker clip placements following stereotactic core needle biopsy of right breast calcifications. EXAM: 3D DIAGNOSTIC RIGHT MAMMOGRAM POST STEREOTACTIC BIOPSY COMPARISON:  Previous exam(s). FINDINGS: 3D Mammographic images were obtained following stereotactic guided biopsy of the anterior and posterior aspects a large band right breast calcifications. The ribbon shaped post biopsy marker clip lies along the posterior aspect of the calcifications and the coil shaped post biopsy marker clip lies along the more anterior  aspect. IMPRESSION: Appropriate positioning of the ribbon and coil shaped biopsy marking clips at the site of biopsy in the posterior, upper outer and anterior lateral aspect of the right breast respectively. Final Assessment: Post Procedure Mammograms for Marker Placement Electronically Signed   By: Amie Portland M.D.   On: 01/06/2023 11:32   MM Digital Diagnostic Unilat R  Result Date: 12/31/2022 CLINICAL DATA:  Screening recall for right breast calcifications. EXAM: DIGITAL DIAGNOSTIC UNILATERAL RIGHT MAMMOGRAM TECHNIQUE: Right digital diagnostic mammography was performed. COMPARISON:  Previous exam(s). ACR Breast Density Category b: There are scattered areas of fibroglandular density. FINDINGS: Spot compression magnification views were performed of the central to outer right breast. There are diffuse calcifications spanning approximately 15 cm, the majority of which are large and rod-like demonstrating imaging features most suggestive of secretory calcifications, although some of these are somewhat more punctate and amorphous and asymmetric when compared to the contralateral breast. IMPRESSION: Indeterminate calcifications throughout the outer right breast spanning 15 cm. RECOMMENDATION: Recommend 2 site stereotactic guided biopsy of the calcifications in the right breast, to include the more anterior and more posterior extent of these calcifications. I have discussed the findings and recommendations with the patient. If applicable, a reminder letter will be sent to the patient regarding the next appointment. BI-RADS CATEGORY  4: Suspicious. Electronically Signed   By: Edwin Cap M.D.   On: 12/31/2022 11:11    Assessment and Plan: ***     The length of time of the  face-to-face encounter was *** minutes. More than 50% of time was spent counseling and coordination of care.     Thank you for this referral.

## 2023-01-18 NOTE — Assessment & Plan Note (Signed)
This is a very pleasant 67 year old postmenopausal female patient with newly diagnosed right breast invasive ductal carcinoma, grade 1, DCIS low to intermediate grade, ER/PR positive HER2 1+ by IHC Ki-67 of 2% presented to the breast [facility] for additional recommendations.  Given small tumor, strong ER/PR positivity and low proliferation index, she will proceed with upfront surgery.  Will review final pathology and see if she is a candidate for Oncotype DX.  Will consider Oncotype DX of her final tumor size is more than 5 mm.  If she is not a candidate for Oncotype DX, she may consider radiation based on the type of surgery offered.  After local therapy, we will consider antiestrogen therapy.  We focused our discussion today mostly on antiestrogen therapy.  We have discussed options for antiestrogen therapy today  With regards to Tamoxifen, we discussed that this is a SERM, selective estrogen receptor modulator. We discussed mechanism of action of Tamoxifen, adverse effects on Tamoxifen including but not limited to post menopausal symptoms, increased risk of DVT/PE, increased risk of endometrial cancer, questionable cataracts with long term use and increased risk of cardiovascular events in the study which was not statistically significant. A benefit from Tamoxifen would be improvement in bone density.  With regards to aromatase inhibitors, we discussed mechanism of action, adverse effects including but not limited to post menopausal symptoms, arthralgias, myalgias, increased risk of cardiovascular events and bone loss.  Tamoxifen can be used in premenopausal and post menopausal women. Aromatase inhibitors can only be used in premenopausal women along with OFS.  We are currently considering aromatase inhibitors.  She will return to clinic after surgery to review final pathology results and to discuss additional recommendations.  Thank you for consulting [place] in the care of this patient.  Please do not hesitate to  contact us with any additional questions or concerns.

## 2023-01-18 NOTE — Therapy (Signed)
OUTPATIENT PHYSICAL THERAPY BREAST CANCER BASELINE EVALUATION   Patient Name: [facility] MRN: [id number] DOB:[birth year], 67 y.o., female Today's Date: 01/18/2023  END OF SESSION:  PT End of Session - 01/18/23 1052     Visit Number 1    Number of Visits 2    Date for PT Re-Evaluation 03/15/23    PT Start Time 0958    PT Stop Time 1028    PT Time Calculation (min) 30 min    Activity Tolerance Patient tolerated treatment well    Behavior During Therapy [facility] for tasks assessed/performed             Past Medical History:  Diagnosis Date   Arthritis    Dysphagia 01/17/2017   History of blood transfusion    with childbirth   Hypertension    Hypothyroidism    surgical   Past Surgical History:  Procedure Laterality Date   ABDOMINAL HYSTERECTOMY     ARTHROSCOPIC HAGLUNDS REPAIR Right 01/04/2019   Procedure: ENDOSCOPIC RESECTION HAGLUNDS DEFORMITY;  Surgeon: Kerry Mustard, MD;  Location: [facility];  Service: Orthopedics;  Laterality: Right;   BREAST BIOPSY Right 01/06/2023   MM RT BREAST BX W LOC DEV 1ST LESION IMAGE BX SPEC STEREO GUIDE 01/06/2023 [facility]   BREAST BIOPSY Right 01/06/2023   MM RT BREAST BX W LOC DEV EA AD LESION IMG BX SPEC STEREO GUIDE 01/06/2023 [facility]   COLONOSCOPY     THYROIDECTOMY     90% removed   Patient Active Problem List   Diagnosis Date Noted   Malignant neoplasm of upper-outer quadrant of right breast in female, estrogen receptor positive (HCC) 01/16/2023   No vaccination-pt refuse 01/08/2023   Exercise-induced asthma 01/06/2022   Stage 3a chronic kidney disease (CKD) (HCC) 07/25/2021   Elevated LDL cholesterol level 10/15/2020   Vitamin D deficiency 10/15/2020   Morbid obesity (HCC) 03/22/2018   Hypocalcemia 09/07/2017   BMI 45.0-49.9, adult (HCC) 04/05/2016   Arthritis of both knees 04/05/2016   Essential hypertension 12/22/2014   Hashimoto's thyroiditis 12/22/2014    REFERRING PROVIDER: Dr. Harriette Phillips  REFERRING DIAG: Right breast cancer  THERAPY DIAG:  Malignant neoplasm of upper-outer quadrant of right breast in female, estrogen receptor positive (HCC)  Abnormal posture  Rationale for Evaluation and Treatment: Rehabilitation  ONSET DATE: 12/14/2022  SUBJECTIVE:                                                                                                                                                                                           SUBJECTIVE STATEMENT: Patient reports she is here today to be seen by  her medical team for her newly diagnosed right breast cancer.   PERTINENT HISTORY:  Patient was diagnosed on 12/14/2022 with right grade I invasive ductal carcinoma breast cancer. It measures 15 cm and is located in the upper outer quadrant. It is ER/PR positive and HER2 negative with a Ki67 of 2%.   PATIENT GOALS:   reduce lymphedema risk and learn post op HEP.   PAIN:  Are you having pain? No  PRECAUTIONS: Active CA   RED FLAGS: None   HAND DOMINANCE: right  WEIGHT BEARING RESTRICTIONS: No  FALLS:  Has patient fallen in last 6 months? 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  COGNITION: Overall cognitive status: Within functional limits for tasks assessed    POSTURE:  Forward head and rounded shoulders posture  UPPER EXTREMITY AROM/PROM:  A/PROM RIGHT   eval   Shoulder extension 50  Shoulder flexion 135  Shoulder abduction 142  Shoulder internal rotation 72  Shoulder external rotation 71    (Blank rows = not tested)  A/PROM LEFT   eval  Shoulder extension 57  Shoulder flexion 130   Shoulder abduction 126  Shoulder internal rotation 63  Shoulder external rotation 65    (Blank rows = not tested)  CERVICAL AROM: All within normal limits:    Percent limited  Flexion WNL  Extension WNL  Right lateral flexion WNL  Left lateral flexion WNL  Right rotation 25% limited  Left rotation 25% limited     UPPER EXTREMITY STRENGTH: WNL  LYMPHEDEMA ASSESSMENTS (in cm):   LANDMARK RIGHT   eval  10 cm proximal to olecranon process 36.9  Olecranon process 28.4  10 cm proximal to ulnar styloid process 25.8  Just proximal to ulnar styloid process 18.2  Across hand at thumb web space 21.3  At base of 2nd digit 7.6  (Blank rows = not tested)  LANDMARK LEFT   eval  10 cm proximal to olecranon process 38.8  Olecranon process 28.9  10 cm proximal to ulnar styloid process 25.4  Just proximal to ulnar styloid process 18.5  Across hand at thumb web space 20.4  At base of 2nd digit 7.3  (Blank rows = not tested)  L-DEX LYMPHEDEMA SCREENING:  The patient was assessed using the L-Dex machine today to produce a lymphedema index baseline score. The patient will be reassessed on a regular basis (typically every 3 months) to obtain new L-Dex scores. If the score is > 6.5 points away from his/her baseline score indicating onset of subclinical lymphedema, it will be recommended to wear a compression garment for 4 weeks, 12 hours per day and then be reassessed. If the score continues to be > 6.5 points from baseline at reassessment, we will initiate lymphedema treatment. Assessing in this manner has a 95% rate of preventing clinically significant lymphedema.   L-DEX FLOWSHEETS - 01/18/23 1000       L-DEX LYMPHEDEMA SCREENING   Measurement Type Unilateral    L-DEX MEASUREMENT EXTREMITY Upper Extremity    POSITION  Standing    DOMINANT SIDE Right    At Risk Side Right    BASELINE SCORE (UNILATERAL) -4.5  QUICK DASH SURVEY:  [facility] - 01/18/23 0001      Open a tight or new jar No difficulty    Do heavy household chores (wash walls, wash floors) No difficulty    Carry a shopping bag or briefcase No difficulty    Wash your back No difficulty    Use a knife to cut food No difficulty    Recreational activities in which you take some force or impact through your arm, shoulder, or hand (golf, hammering, tennis) No difficulty    During the past week, to what extent has your arm, shoulder or hand problem interfered with your normal social activities with family, friends, neighbors, or groups? Not at all    During the past week, to what extent has your arm, shoulder or hand problem limited your work or other regular daily activities Not at all    Arm, shoulder, or hand pain. None    Tingling (pins and needles) in your arm, shoulder, or hand None    Difficulty Sleeping No difficulty    DASH Score 0 %              PATIENT EDUCATION:  Education details: Lymphedema risk reduction and post op shoulder/posture HEP Person educated: Patient Education method: Explanation, Demonstration, Handout Education comprehension: Patient verbalized understanding and returned demonstration  HOME EXERCISE PROGRAM: Patient was instructed today in a home exercise program today for post op shoulder range of motion. These included active assist shoulder flexion in sitting, scapular retraction, wall walking with shoulder abduction, and hands behind head external rotation.  She was encouraged to do these twice a day, holding 3 seconds and repeating 5 times when permitted by her physician.   ASSESSMENT:  CLINICAL IMPRESSION: Patient was diagnosed on 12/14/2022 with right grade I invasive ductal carcinoma breast cancer. It measures 15 cm and is located in the upper outer quadrant. It is ER/PR positive and HER2 negative with a Ki67 of 2%. Her multidisciplinary medical team met prior to her assessments to determine a recommended treatment plan. She is planning to have a  right lumpectomy and sentinel node biopsy with a reduction and lift followed by possible radiation and anti-estrogen therapy. She will benefit from a post op PT reassessment to determine needs and from L-Dex screens every 3 months for 2 years to detect subclinical lymphedema.  Pt will benefit from skilled therapeutic intervention to improve on the following deficits: Decreased knowledge of precautions, impaired UE functional use, pain, decreased ROM, postural dysfunction.   PT treatment/interventions: ADL/self-care home management, pt/family education, therapeutic exercise  REHAB POTENTIAL: Excellent  CLINICAL DECISION MAKING: Stable/uncomplicated  EVALUATION COMPLEXITY: Low   GOALS: Goals reviewed with patient? YES  LONG TERM GOALS: (STG=LTG)    Name Target Date Goal status  1 Pt will be able to verbalize understanding of pertinent lymphedema risk reduction practices relevant to her dx specifically related to skin care.  Baseline:  No knowledge 01/18/2023 Achieved at eval  2 Pt will be able to return demo and/or verbalize understanding of the post op HEP related to regaining shoulder ROM. Baseline:  No knowledge 01/18/2023 Achieved at eval  3 Pt will be able to verbalize understanding of the importance of attending the post op After Breast CA Class for further lymphedema risk reduction education and therapeutic exercise.  Baseline:  No knowledge 01/18/2023 Achieved at eval  4 Pt will demo she has regained full shoulder ROM and function post operatively compared to baselines.  Baseline: See objective measurements taken  today. 03/14/2022     PLAN:  PT FREQUENCY/DURATION: EVAL and 1 follow up appointment.   PLAN FOR NEXT SESSION: will reassess 3-4 weeks post op to determine needs.   Patient will follow up at outpatient cancer rehab 3-4 weeks following surgery.  If the patient requires physical therapy at that time, a specific plan will be dictated and sent to the referring physician  for approval. The patient was educated today on appropriate basic range of motion exercises to begin post operatively and the importance of attending the After Breast Cancer class following surgery.  Patient was educated today on lymphedema risk reduction practices as it pertains to recommendations that will benefit the patient immediately following surgery.  She verbalized good understanding.    Physical Therapy Information for After Breast Cancer Surgery/Treatment:  Lymphedema is a swelling condition that you may be at risk for in your arm if you have lymph nodes removed from the armpit area.  After a sentinel node biopsy, the risk is approximately 5-9% and is higher after an axillary node dissection.  There is treatment available for this condition and it is not life-threatening.  Contact your physician or physical therapist with concerns. You may begin the 4 shoulder/posture exercises (see additional sheet) when permitted by your physician (typically a week after surgery).  If you have drains, you may need to wait until those are removed before beginning range of motion exercises.  A general recommendation is to not lift your arms above shoulder height until drains are removed.  These exercises should be done to your tolerance and gently.  This is not a "no pain/no gain" type of recovery so listen to your body and stretch into the range of motion that you can tolerate, stopping if you have pain.  If you are having immediate reconstruction, ask your plastic surgeon about doing exercises as he or she may want you to wait. We encourage you to attend the free one time ABC (After Breast Cancer) class offered by [facility].  You will learn information related to lymphedema risk, prevention and treatment and additional exercises to regain mobility following surgery.  You can call [phone] for more information.  This is offered the 1st and 3rd Monday of each month.  You only attend  the class one time. While undergoing any medical procedure or treatment, try to avoid blood pressure being taken or needle sticks from occurring on the arm on the side of cancer.   This recommendation begins after surgery and continues for the rest of your life.  This may help reduce your risk of getting lymphedema (swelling in your arm). An excellent resource for those seeking information on lymphedema is the National Lymphedema Network's web site. It can be accessed at www.lymphnet.org If you notice swelling in your hand, arm or breast at any time following surgery (even if it is many years from now), please contact your doctor or physical therapist to discuss this.  Lymphedema can be treated at any time but it is easier for you if it is treated early on.  If you feel like your shoulder motion is not returning to normal in a reasonable amount of time, please contact your surgeon or physical therapist.  [facility] [phone]. [address], [place] [address]  ABC CLASS After Breast Cancer Class  After Breast Cancer Class is a specially designed exercise class to assist you in a safe recover after having breast cancer surgery.  In this class you will learn how to get back to full function whether your drains were just removed or if you had surgery a month ago.  This one-time class is held the 1st and 3rd Monday of every month from 11:00 a.m. until 12:00 noon virtually.  This class is FREE and space is limited. For more information or to register for the next available class, call [phone].  Class Goals  Understand specific stretches to improve the flexibility of you chest and shoulder. Learn ways to safely strengthen your upper body and improve your posture. Understand the warning signs of infection and why you may be at risk for an arm infection. Learn about Lymphedema and prevention.  ** You do not attend this class until after surgery.   Drains must be removed to participate  Patient was instructed today in a home exercise program today for post op shoulder range of motion. These included active assist shoulder flexion in sitting, scapular retraction, wall walking with shoulder abduction, and hands behind head external rotation.  She was encouraged to do these twice a day, holding 3 seconds and repeating 5 times when permitted by her physician.  Bethann Punches, [place] 01/18/23 11:07 AM

## 2023-01-18 NOTE — Progress Notes (Signed)
REFERRING PROVIDER: Rachel Moulds, MD  PRIMARY PROVIDER:  Marjie Skiff, NP  PRIMARY REASON FOR VISIT:  1. Malignant neoplasm of upper-outer quadrant of right breast in female, estrogen receptor positive (HCC)   2. Family history of breast cancer    HISTORY OF PRESENT ILLNESS:   Kerry Phillips, a 67 y.o. female, was seen for a [place] cancer genetics consultation at the request of Dr. Al Pimple due to a personal and family history of cancer.  Kerry Phillips presents to clinic today to discuss the possibility of a hereditary predisposition to cancer, to discuss genetic testing, and to further clarify her future cancer risks, as well as potential cancer risks for family members.   In November 2024, at the age of 57, Kerry Phillips was diagnosed with invasive ductal carcinoma of the right breast (ER/PR positive, HER2 negative).  CANCER HISTORY:  Oncology History  Malignant neoplasm of upper-outer quadrant of right breast in female, estrogen receptor positive (HCC)  12/14/2022 Mammogram   In the right breast, calcifications warranting further evaluation with magnified views.  In the left breast no finding suspicious for malignancy.  Ultrasound of the right axilla is performed showing normal lymph nodes.   01/06/2023 Pathology Results   Right breast needle core biopsy upper outer quadrant showed focal atypical ductal hyperplasia, fibroadenomatoid change with calcifications. Right breast needle core biopsy upper outer quadrant anterior aspect showed invasive ductal carcinoma, overall grade 1, DCIS low to intermediate nuclear grade.  Invasive cancer showed ER 95% positive strong staining, PR 20% positive strong staining, Ki-67 of 2% and HER2 1+   01/16/2023 Initial Diagnosis   Malignant neoplasm of upper-outer quadrant of right breast in female, estrogen receptor positive (HCC)   01/18/2023 Cancer Staging   Staging form: Breast, AJCC 8th Edition - Clinical stage from 01/18/2023: Stage IA (cT1a, cN0, cM0,  G1, ER+, PR+, HER2-) - Signed by Rachel Moulds, MD on 01/18/2023 Stage prefix: Initial diagnosis Histologic grading system: 3 grade system Laterality: Right Staged by: Pathologist and managing physician Stage used in treatment planning: Yes National guidelines used in treatment planning: Yes Type of national guideline used in treatment planning: NCCN    Past Medical History:  Diagnosis Date   Arthritis    Dysphagia 01/17/2017   History of blood transfusion    with childbirth   Hypertension    Hypothyroidism    surgical    Past Surgical History:  Procedure Laterality Date   ABDOMINAL HYSTERECTOMY     ARTHROSCOPIC HAGLUNDS REPAIR Right 01/04/2019   Procedure: ENDOSCOPIC RESECTION HAGLUNDS DEFORMITY;  Surgeon: Nadara Mustard, MD;  [facility];  Service: Orthopedics;  Laterality: Right;   BREAST BIOPSY Right 01/06/2023   MM RT BREAST BX W LOC DEV 1ST LESION IMAGE BX SPEC STEREO GUIDE 01/06/2023 [facility]   BREAST BIOPSY Right 01/06/2023   MM RT BREAST BX W LOC DEV EA AD LESION IMG BX SPEC STEREO GUIDE 01/06/2023 [facility]   COLONOSCOPY     THYROIDECTOMY     90% removed    Social History   Socioeconomic History   Marital status: Married    Spouse name: Not on file   Number of children: Not on file   Years of education: Not on file   Highest education level: Not on file  Occupational History   Not on file  Tobacco Use   Smoking status: Never   Smokeless tobacco: Never  Vaping Use   Vaping status: Never Used  Substance and Sexual Activity  Alcohol use: No   Drug use: Never   Sexual activity: Yes  Other Topics Concern   Not on file  Social History Narrative   Not on file   Social Determinants of Health   Financial Resource Strain: Low Risk  (04/11/2022)   Overall Financial Resource Strain (CARDIA)    Difficulty of Paying Living Expenses: Not hard at all  Food Insecurity: No Food Insecurity (01/18/2023)   Hunger Vital Sign    Worried About  Running Out of Food in the Last Year: Never true    Ran Out of Food in the Last Year: Never true  Transportation Needs: No Transportation Needs (01/18/2023)   PRAPARE - Administrator, Civil Service (Medical): No    Lack of Transportation (Non-Medical): No  Physical Activity: Insufficiently Active (04/11/2022)   Exercise Vital Sign    Days of Exercise per Week: 3 days    Minutes of Exercise per Session: 30 min  Stress: No Stress Concern Present (04/11/2022)   [employer] of Occupational Health - Occupational Stress Questionnaire    Feeling of Stress : Not at all  Social Connections: Socially Integrated (04/11/2022)   Social Connection and Isolation Panel [NHANES]    Frequency of Communication with Friends and Family: More than three times a week    Frequency of Social Gatherings with Friends and Family: More than three times a week    Attends Religious Services: More than 4 times per year    Active Member of [employer] or Organizations: Yes    Attends Engineer, structural: More than 4 times per year    Marital Status: Married     FAMILY HISTORY:  We obtained a detailed, 4-generation family history.  Significant diagnoses are listed below: Family History  Problem Relation Age of Onset   Hypertension Mother    Stroke Sister    Hypertension Sister    Lupus Sister    Breast cancer Sister        dx. <50   Breast cancer Maternal Aunt        dx. >50   Hypertension Daughter    Hypertension Son    Breast cancer Other        3 maternal first cousins once removed     Kerry Phillips's sister was diagnosed with breast cancer at an unknown age (<50). One maternal aunt was diagnosed with breast cancer at an unknown age (>50). Three maternal first cousins once removed were diagnosed with breast cancer at unknown ages. Of note, she has no information about her paternal family medical history. When she wrote her father had lung cancer on her intake, she was referring to her  stepfather, not her biological father. Kerry Phillips is unaware of previous family history of genetic testing for hereditary cancer risks. There is no reported Ashkenazi Jewish ancestry.   GENETIC COUNSELING ASSESSMENT: Kerry Phillips is a 67 y.o. female with a personal and family history of cancer which is somewhat suggestive of a hereditary predisposition to cancer. We, therefore, discussed and recommended the following at today's visit.   DISCUSSION: We discussed that 5 - 10% of cancer is hereditary, with most cases of breast cancer associated with BRCA1/2.  There are other genes that can be associated with hereditary breast cancer syndromes.  We discussed that testing is beneficial for several reasons including knowing how to follow individuals after completing their treatment, identifying whether potential treatment options would be beneficial, and understanding if other family members could be at  risk for cancer and allowing them to undergo genetic testing.   We reviewed the characteristics, features and inheritance patterns of hereditary cancer syndromes. We also discussed genetic testing, including the appropriate family members to test, the process of testing, insurance coverage and turn-around-time for results. We discussed the implications of a negative, positive, carrier and/or variant of uncertain significant result. We recommended Kerry Phillips pursue genetic testing for a panel that includes genes associated with breast cancer.   Kerry Phillips elected to have [facility] CancerNext-Expanded Panel. The CancerNext-Expanded gene panel offered by [facility] and includes sequencing, rearrangement, and RNA analysis for the following 76 genes: AIP, ALK, APC, ATM, AXIN2, BAP1, BARD1, BMPR1A, BRCA1, BRCA2, BRIP1, CDC73, CDH1, CDK4, CDKN1B, CDKN2A, CEBPA, CHEK2, CTNNA1, DDX41, DICER1, ETV6, FH, FLCN, GATA2, LZTR1, MAX, MBD4, MEN1, MET, MLH1, MSH2, MSH3, MSH6, MUTYH, NF1, NF2, NTHL1, PALB2, PHOX2B, PMS2, POT1, PRKAR1A, PTCH1,  PTEN, RAD51C, RAD51D, RB1, RET, RUNX1, SDHA, SDHAF2, SDHB, SDHC, SDHD, SMAD4, SMARCA4, SMARCB1, SMARCE1, STK11, SUFU, TMEM127, TP53, TSC1, TSC2, VHL, and WT1 (sequencing and deletion/duplication); EGFR, HOXB13, KIT, MITF, PDGFRA, POLD1, and POLE (sequencing only); EPCAM and GREM1 (deletion/duplication only).    Based on Kerry Phillips's personal and family history of cancer, she meets medical criteria for genetic testing. Despite that she meets criteria, she may still have an out of pocket cost. We discussed that if her out of pocket cost for testing is over $100, the laboratory should contact them to discuss self-pay prices, patient pay assistance programs, if applicable, and other billing options.  PLAN: After considering the risks, benefits, and limitations, Kerry Phillips provided informed consent to pursue genetic testing and the blood sample was sent to [facility] for analysis of the CancerNext-Expanded Panel. Results should be available within approximately 2-3 weeks' time, at which point they will be disclosed by telephone to Kerry Phillips, as will any additional recommendations warranted by these results. Kerry Phillips will receive a summary of her genetic counseling visit and a copy of her results once available. This information will also be available in Epic.   Kerry Phillips questions were answered to her satisfaction today. Our contact information was provided should additional questions or concerns arise. Thank you for the referral and allowing [place] to share in the care of your patient.   Lalla Brothers, MS, [facility] Genetic Counselor [email or web address] (P) [phone]  The patient was seen for a total of 20 minutes in face-to-face genetic counseling. The patient brought her husband. Drs. Pamelia Hoit and/or Mosetta Putt were available to discuss this case as needed.   _______________________________________________________________________ For Office Staff:  Number of people involved in session: 2 Was an  Intern/ student involved with case: no

## 2023-01-18 NOTE — Progress Notes (Signed)
[facility] Clinical Social Work  Initial Assessment   Kerry Phillips is a 67 y.o. year old female accompanied by husband. Clinical Social Work was referred by  [facility]  for assessment of psychosocial needs.   SDOH (Social Determinants of Health) assessments performed: Yes SDOH Interventions    Flowsheet Row Clinical Support from 01/18/2023 in [facility] [place] Clinical Support from 04/11/2022 in [facility] Office Visit from 01/06/2022 in [facility] Clinical Support from 02/15/2021 in [facility]  SDOH Interventions      Food Insecurity Interventions Intervention Not Indicated Intervention Not Indicated -- --  Housing Interventions Intervention Not Indicated Intervention Not Indicated -- Intervention Not Indicated  Transportation Interventions Intervention Not Indicated Intervention Not Indicated -- Intervention Not Indicated  Utilities Interventions Intervention Not Indicated Intervention Not Indicated -- --  Alcohol Usage Interventions -- Intervention Not Indicated (Score <7) -- --  Depression Interventions/Treatment  -- -- Patient refuses Treatment --  Financial Strain Interventions -- Intervention Not Indicated -- Intervention Not Indicated  Physical Activity Interventions -- Intervention Not Indicated -- Intervention Not Indicated  Stress Interventions -- Intervention Not Indicated -- Intervention Not Indicated  Social Connections Interventions -- Intervention Not Indicated -- Intervention Not Indicated       SDOH Screenings   Food Insecurity: No Food Insecurity (01/18/2023)  Housing: Low Risk  (01/18/2023)  Transportation Needs: No Transportation Needs (01/18/2023)  Utilities: Not At Risk (01/18/2023)  Alcohol Screen: Low Risk  (04/11/2022)  Depression (PHQ2-9): Low Risk  (01/13/2023)  Financial Resource Strain: Low Risk  (04/11/2022)  Physical Activity:  Insufficiently Active (04/11/2022)  Social Connections: Socially Integrated (04/11/2022)  Stress: No Stress Concern Present (04/11/2022)  Tobacco Use: Low Risk  (01/18/2023)     Family/Social Information:  Housing Arrangement: patient lives with her husband  of 49 years Family members/support persons in your life? Family and Friends Transportation concerns: no  Employment: works for herself as a Research scientist (medical) .  Income source: Employment Financial concerns: No Type of concern: None Food access concerns: no Services Currently in place:  Medicare + supplement  Coping/ Adjustment to diagnosis: Patient understands treatment plan and what happens next? Yes (lumpectomy, radiation, AI) Patient reported stressors:  reporting feeling minimal stress Patient enjoys  cooking, walking, prayer, time with family, doing projects, staying busy Current coping skills/ strengths: Active sense of humor , Communication skills , Motivation for treatment/growth , Special hobby/interest , and Supportive family/friends     SUMMARY: Current SDOH Barriers:  No major barriers noted today  Clinical Social Work Clinical Goal(s):  No clinical social work goals at this time  Interventions: Discussed common feeling and emotions when being diagnosed with cancer, and the importance of support during treatment Informed patient of the support team roles and support services at [facility] Provided CSW contact information and encouraged patient to call with any questions or concerns   Follow Up Plan: Patient will contact CSW with any support or resource needs Patient verbalizes understanding of plan: Yes    Johnatha Zeidman E Delmar Dondero, LCSW Clinical Social Worker [facility]

## 2023-01-18 NOTE — Progress Notes (Signed)
[facility] CONSULT NOTE  Patient Care Team: Marjie Skiff, NP as PCP - General (Nurse Practitioner) Pershing Proud, RN as Oncology Nurse Navigator Donnelly Angelica, RN as Oncology Nurse Navigator Harriette Bouillon, MD as Consulting Physician (General Surgery) Rachel Moulds, MD as Consulting Physician (Hematology and Oncology) Lonie Peak, MD as Attending Physician (Radiation Oncology)  CHIEF COMPLAINTS/PURPOSE OF CONSULTATION:  Newly diagnosed breast cancer  HISTORY OF PRESENTING ILLNESS:  Kerry Phillips 67 y.o. female is here because of recent diagnosis of right breast IDC  I reviewed her records extensively and collaborated the history with the patient.  SUMMARY OF ONCOLOGIC HISTORY: Oncology History  Malignant neoplasm of upper-outer quadrant of right breast in female, estrogen receptor positive (HCC)  12/14/2022 Mammogram   In the right breast, calcifications warranting further evaluation with magnified views.  In the left breast no finding suspicious for malignancy.  Ultrasound of the right axilla is performed showing normal lymph nodes.   01/06/2023 Pathology Results   Right breast needle core biopsy upper outer quadrant showed focal atypical ductal hyperplasia, fibroadenomatoid change with calcifications. Right breast needle core biopsy upper outer quadrant anterior aspect showed invasive ductal carcinoma, overall grade 1, DCIS low to intermediate nuclear grade.  Invasive cancer showed ER 95% positive strong staining, PR 20% positive strong staining, Ki-67 of 2% and HER2 1+   01/16/2023 Initial Diagnosis   Malignant neoplasm of upper-outer quadrant of right breast in female, estrogen receptor positive (HCC)   01/18/2023 Cancer Staging   Staging form: Breast, AJCC 8th Edition - Clinical stage from 01/18/2023: Stage IA (cT1a, cN0, cM0, G1, ER+, PR+, HER2-) - Signed by Rachel Moulds, MD on 01/18/2023 Stage prefix: Initial diagnosis Histologic grading  system: 3 grade system Laterality: Right Staged by: Pathologist and managing physician Stage used in treatment planning: Yes National guidelines used in treatment planning: Yes Type of national guideline used in treatment planning: NCCN    Ms. Valois is here for an initial appointment with her husband.  At baseline she has high blood pressure, hypothyroidism, otherwise healthy.  Rest of the pertinent 10 point ROS reviewed and negative  MEDICAL HISTORY:  Past Medical History:  Diagnosis Date   Arthritis    Dysphagia 01/17/2017   History of blood transfusion    with childbirth   Hypertension    Hypothyroidism    surgical    SURGICAL HISTORY: Past Surgical History:  Procedure Laterality Date   ABDOMINAL HYSTERECTOMY     ARTHROSCOPIC HAGLUNDS REPAIR Right 01/04/2019   Procedure: ENDOSCOPIC RESECTION HAGLUNDS DEFORMITY;  Surgeon: Nadara Mustard, MD;  Location: [facility] OR;  Service: Orthopedics;  Laterality: Right;   BREAST BIOPSY Right 01/06/2023   MM RT BREAST BX W LOC DEV 1ST LESION IMAGE BX SPEC STEREO GUIDE 01/06/2023 [facility]   BREAST BIOPSY Right 01/06/2023   MM RT BREAST BX W LOC DEV EA AD LESION IMG BX SPEC STEREO GUIDE 01/06/2023 [facility]   COLONOSCOPY     THYROIDECTOMY     90% removed    SOCIAL HISTORY: Social History   Socioeconomic History   Marital status: Married    Spouse name: Not on file   Number of children: Not on file   Years of education: Not on file   Highest education level: Not on file  Occupational History   Not on file  Tobacco Use   Smoking status: Never   Smokeless tobacco: Never  Vaping Use   Vaping status: Never Used  Substance and  Sexual Activity   Alcohol use: No   Drug use: Never   Sexual activity: Yes  Other Topics Concern   Not on file  Social History Narrative   Not on file   Social Determinants of Health   Financial Resource Strain: Low Risk  (04/11/2022)   Overall Financial Resource Strain (CARDIA)    Difficulty  of Paying Living Expenses: Not hard at all  Food Insecurity: No Food Insecurity (01/18/2023)   Hunger Vital Sign    Worried About Running Out of Food in the Last Year: Never true    Ran Out of Food in the Last Year: Never true  Transportation Needs: No Transportation Needs (01/18/2023)   PRAPARE - Administrator, Civil Service (Medical): No    Lack of Transportation (Non-Medical): No  Physical Activity: Insufficiently Active (04/11/2022)   Exercise Vital Sign    Days of Exercise per Week: 3 days    Minutes of Exercise per Session: 30 min  Stress: No Stress Concern Present (04/11/2022)   [employer] of Occupational Health - Occupational Stress Questionnaire    Feeling of Stress : Not at all  Social Connections: Socially Integrated (04/11/2022)   Social Connection and Isolation Panel [NHANES]    Frequency of Communication with Friends and Family: More than three times a week    Frequency of Social Gatherings with Friends and Family: More than three times a week    Attends Religious Services: More than 4 times per year    Active Member of [employer] or Organizations: Yes    Attends Engineer, structural: More than 4 times per year    Marital Status: Married  Catering manager Violence: Not At Risk (01/18/2023)   Humiliation, Afraid, Rape, and Kick questionnaire    Fear of Current or Ex-Partner: No    Emotionally Abused: No    Physically Abused: No    Sexually Abused: No    FAMILY HISTORY: Family History  Problem Relation Age of Onset   Hypertension Mother    Cancer Father        Lung   Stroke Sister    Hypertension Sister    Lupus Sister    Breast cancer Sister    Hypertension Daughter    Hypertension Son    Breast cancer Maternal Aunt     ALLERGIES:  has No Known Allergies.  MEDICATIONS:  Current Outpatient Medications  Medication Sig Dispense Refill   calcitRIOL (ROCALTROL) 0.5 MCG capsule Take 1 capsule (0.5 mcg total) by mouth 2 (two) times daily.  180 capsule 4   levothyroxine (SYNTHROID) 100 MCG tablet TAKE 1 TABLET BY MOUTH EVERY DAY 90 tablet 1   Olmesartan-amLODIPine-HCTZ 40-5-12.5 MG TABS TAKE 1 TABLET BY MOUTH ONCE DAILY 90 tablet 4   albuterol (VENTOLIN HFA) 108 (90 Base) MCG/ACT inhaler Inhale 2 puffs into the lungs every 6 (six) hours as needed. 18 g 2   Iron-Vitamins (GERITOL TONIC PO) Take 5 mLs by mouth daily.     rosuvastatin (CRESTOR) 10 MG tablet Take 1 tablet (10 mg total) by mouth daily. 90 tablet 4   No current facility-administered medications for this visit.    REVIEW OF SYSTEMS:   Constitutional: Denies fevers, chills or abnormal night sweats Eyes: Denies blurriness of vision, double vision or watery eyes Ears, nose, mouth, throat, and face: Denies mucositis or sore throat Respiratory: Denies cough, dyspnea or wheezes Cardiovascular: Denies palpitation, chest discomfort or lower extremity swelling Gastrointestinal:  Denies nausea, heartburn or change in  bowel habits Skin: Denies abnormal skin rashes Lymphatics: Denies new lymphadenopathy or easy bruising Neurological:Denies numbness, tingling or new weaknesses Behavioral/Psych: Mood is stable, no new changes  Breast: Denies any palpable lumps or discharge All other systems were reviewed with the patient and are negative.  PHYSICAL EXAMINATION: ECOG PERFORMANCE STATUS: 0 - Asymptomatic  Vitals:   01/18/23 0903  BP: (!) 152/96  Pulse: (!) 107  Resp: 16  Temp: (!) 97.5 F (36.4 C)  SpO2: 97%   Filed Weights   01/18/23 0903  Weight: 288 lb 3.2 oz (130.7 kg)    GENERAL:alert, no distress and comfortable SKIN: skin color, texture, turgor are normal, no rashes or significant lesions EYES: normal, conjunctiva are pink and non-injected, sclera clear OROPHARYNX:no exudate, no erythema and lips, buccal mucosa, and tongue normal  NECK: supple, thyroid normal size, non-tender, without nodularity LYMPH:  no palpable lymphadenopathy in the cervical, axillary   LUNGS: clear to auscultation and percussion with normal breathing effort HEART: regular rate & rhythm and no murmurs and no lower extremity edema ABDOMEN:abdomen soft, non-tender and normal bowel sounds Musculoskeletal:no cyanosis of digits and no clubbing  PSYCH: alert & oriented x 3 with fluent speech NEURO: no focal motor/sensory deficits BREAST: Postbiopsy changes in the right breast.  No obvious palpable masses.  No regional adenopathy  LABORATORY DATA:  I have reviewed the data as listed Lab Results  Component Value Date   WBC 6.9 01/18/2023   HGB 12.5 01/18/2023   HCT 37.6 01/18/2023   MCV 85.3 01/18/2023   PLT 308 01/18/2023   Lab Results  Component Value Date   NA 138 01/18/2023   K 3.8 01/18/2023   CL 100 01/18/2023   CO2 32 01/18/2023    RADIOGRAPHIC STUDIES: I have personally reviewed the radiological reports and agreed with the findings in the report.  ASSESSMENT AND PLAN:  Malignant neoplasm of upper-outer quadrant of right breast in female, estrogen receptor positive (HCC) This is a very pleasant 67 year old postmenopausal female patient with newly diagnosed right breast invasive ductal carcinoma, grade 1, DCIS low to intermediate grade, ER/PR positive HER2 1+ by IHC Ki-67 of 2% presented to the breast [facility] for additional recommendations.  Given small tumor, strong ER/PR positivity and low proliferation index, she will proceed with upfront surgery.  Will review final pathology and see if she is a candidate for Oncotype DX.  Will consider Oncotype DX of her final tumor size is more than 5 mm.  If she is not a candidate for Oncotype DX, she may consider radiation based on the type of surgery offered.  After local therapy, we will consider antiestrogen therapy.  We focused our discussion today mostly on antiestrogen therapy.  We have discussed options for antiestrogen therapy today  With regards to Tamoxifen, we discussed that this is a SERM, selective estrogen  receptor modulator. We discussed mechanism of action of Tamoxifen, adverse effects on Tamoxifen including but not limited to post menopausal symptoms, increased risk of DVT/PE, increased risk of endometrial cancer, questionable cataracts with long term use and increased risk of cardiovascular events in the study which was not statistically significant. A benefit from Tamoxifen would be improvement in bone density.  With regards to aromatase inhibitors, we discussed mechanism of action, adverse effects including but not limited to post menopausal symptoms, arthralgias, myalgias, increased risk of cardiovascular events and bone loss.  Tamoxifen can be used in premenopausal and post menopausal women. Aromatase inhibitors can only be used in premenopausal women along with  OFS.  We are currently considering aromatase inhibitors.  She will return to clinic after surgery to review final pathology results and to discuss additional recommendations.  Thank you for consulting [place] in the care of this patient.  Please do not hesitate to contact us with any additional questions or concerns.       All questions were answered. The patient knows to call the clinic with any problems, questions or concerns.    Rachel Moulds, MD 01/18/23

## 2023-01-24 ENCOUNTER — Telehealth: Payer: Self-pay | Admitting: *Deleted

## 2023-01-24 ENCOUNTER — Encounter: Payer: Self-pay | Admitting: *Deleted

## 2023-01-24 NOTE — Telephone Encounter (Signed)
Left vm regarding [facility] from 01/18/23. Contact information provided for questions or needs.

## 2023-01-25 ENCOUNTER — Other Ambulatory Visit: Payer: Self-pay | Admitting: Surgery

## 2023-01-25 DIAGNOSIS — C50911 Malignant neoplasm of unspecified site of right female breast: Secondary | ICD-10-CM

## 2023-01-30 ENCOUNTER — Encounter: Payer: Self-pay | Admitting: *Deleted

## 2023-02-17 ENCOUNTER — Telehealth: Payer: Self-pay | Admitting: Genetic Counselor

## 2023-02-17 ENCOUNTER — Encounter: Payer: Self-pay | Admitting: Genetic Counselor

## 2023-02-17 DIAGNOSIS — Z1379 Encounter for other screening for genetic and chromosomal anomalies: Secondary | ICD-10-CM | POA: Insufficient documentation

## 2023-02-17 NOTE — Telephone Encounter (Addendum)
I contacted Kerry Phillips to discuss her genetic testing results. No pathogenic variants were identified in the 76 genes analyzed. 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 Detailed clinic note to follow.  The test report has been scanned into EPIC and is located under the Molecular Pathology section of the Results Review tab.  A portion of the result report is included below for reference.   Lalla Brothers, MS, [facility] Genetic Counselor [name] (P) [phone]

## 2023-02-20 ENCOUNTER — Ambulatory Visit: Payer: Self-pay | Admitting: Genetic Counselor

## 2023-02-20 DIAGNOSIS — Z1379 Encounter for other screening for genetic and chromosomal anomalies: Secondary | ICD-10-CM

## 2023-02-20 NOTE — Progress Notes (Signed)
HPI:   Kerry Phillips was previously seen in the [facility] clinic due to a personal and family history of cancer and concerns regarding a hereditary predisposition to cancer. Please refer to our prior cancer genetics clinic note for more information regarding our discussion, assessment and recommendations, at the time. Kerry Phillips recent genetic test results were disclosed to her, as were recommendations warranted by these results. These results and recommendations are discussed in more detail below.  CANCER HISTORY:  Oncology History  Malignant neoplasm of upper-outer quadrant of right breast in female, estrogen receptor positive (HCC)  12/14/2022 Mammogram   In the right breast, calcifications warranting further evaluation with magnified views.  In the left breast no finding suspicious for malignancy.  Ultrasound of the right axilla is performed showing normal lymph nodes.   01/06/2023 Pathology Results   Right breast needle core biopsy upper outer quadrant showed focal atypical ductal hyperplasia, fibroadenomatoid change with calcifications. Right breast needle core biopsy upper outer quadrant anterior aspect showed invasive ductal carcinoma, overall grade 1, DCIS low to intermediate nuclear grade.  Invasive cancer showed ER 95% positive strong staining, PR 20% positive strong staining, Ki-67 of 2% and HER2 1+   01/16/2023 Initial Diagnosis   Malignant neoplasm of upper-outer quadrant of right breast in female, estrogen receptor positive (HCC)   01/18/2023 Cancer Staging   Staging form: Breast, AJCC 8th Edition - Clinical stage from 01/18/2023: Stage IA (cT1a, cN0, cM0, G1, ER+, PR+, HER2-) - Signed by Rachel Moulds, MD on 01/18/2023 Stage prefix: Initial diagnosis Histologic grading system: 3 grade system Laterality: Right Staged by: Pathologist and managing physician Stage used in treatment planning: Yes National guidelines used in treatment planning: Yes Type of national  guideline used in treatment planning: NCCN    Genetic Testing   [facility] CancerNext-Expanded Panel+RNA was Negative. 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 Report date is 02/16/2023.  The CancerNext-Expanded gene panel offered by [facility] and includes sequencing, rearrangement, and RNA analysis for the following 76 genes: AIP, ALK, APC, ATM, AXIN2, BAP1, BARD1, BMPR1A, BRCA1, BRCA2, BRIP1, CDC73, CDH1, CDK4, CDKN1B, CDKN2A, CEBPA, CHEK2, CTNNA1, DDX41, DICER1, ETV6, FH, FLCN, GATA2, LZTR1, MAX, MBD4, MEN1, MET, MLH1, MSH2, MSH3, MSH6, MUTYH, NF1, NF2, NTHL1, PALB2, PHOX2B, PMS2, POT1, PRKAR1A, PTCH1, PTEN, RAD51C, RAD51D, RB1, RET, RUNX1, SDHA, SDHAF2, SDHB, SDHC, SDHD, SMAD4, SMARCA4, SMARCB1, SMARCE1, STK11, SUFU, TMEM127, TP53, TSC1, TSC2, VHL, and WT1 (sequencing and deletion/duplication); EGFR, HOXB13, KIT, MITF, PDGFRA, POLD1, and POLE (sequencing only); EPCAM and GREM1 (deletion/duplication only).       FAMILY HISTORY:  We obtained a detailed, 4-generation family history.  Significant diagnoses are listed below:      Family History  Problem Relation Age of Onset   Hypertension Mother     Stroke Sister     Hypertension Sister     Lupus Sister     Breast cancer Sister          dx. <50   Breast cancer Maternal Aunt          dx. >50   Hypertension Daughter     Hypertension Son     Breast cancer Other          3 maternal first cousins once removed           Kerry Phillips's sister was diagnosed with breast cancer at an unknown age (<50). One maternal aunt was diagnosed with breast cancer at an unknown age (>50).  Three maternal first cousins once removed were diagnosed with breast cancer at unknown ages. Of note, she has no information about her paternal family medical history. When she wrote her father had lung cancer on her intake, she was referring to her stepfather, not her biological father. Kerry Phillips is unaware of previous family history  of genetic testing for hereditary cancer risks. There is no reported Ashkenazi Jewish ancestry.   GENETIC TEST RESULTS:  The [facility] CancerNext-Expanded Panel found no pathogenic mutations.   The CancerNext-Expanded gene panel offered by [facility] and includes sequencing, rearrangement, and RNA analysis for the following 76 genes: AIP, ALK, APC, ATM, AXIN2, BAP1, BARD1, BMPR1A, BRCA1, BRCA2, BRIP1, CDC73, CDH1, CDK4, CDKN1B, CDKN2A, CEBPA, CHEK2, CTNNA1, DDX41, DICER1, ETV6, FH, FLCN, GATA2, LZTR1, MAX, MBD4, MEN1, MET, MLH1, MSH2, MSH3, MSH6, MUTYH, NF1, NF2, NTHL1, PALB2, PHOX2B, PMS2, POT1, PRKAR1A, PTCH1, PTEN, RAD51C, RAD51D, RB1, RET, RUNX1, SDHA, SDHAF2, SDHB, SDHC, SDHD, SMAD4, SMARCA4, SMARCB1, SMARCE1, STK11, SUFU, TMEM127, TP53, TSC1, TSC2, VHL, and WT1 (sequencing and deletion/duplication); EGFR, HOXB13, KIT, MITF, PDGFRA, POLD1, and POLE (sequencing only); EPCAM and GREM1 (deletion/duplication only).    The test report has been scanned into EPIC and is located under the Molecular Pathology section of the Results Review tab.  A portion of the result report is included below for reference. Genetic testing reported out on 02/16/2023.       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 At this time, it is unknown if this variant is associated with an increased risk for cancer or if it is benign, but most uncertain variants are reclassified to benign. It should not be used to make medical management decisions. With time, we suspect the laboratory will determine the significance of this variant, if any. If the laboratory reclassifies this variant, we will attempt to contact Kerry Phillips to discuss it further.   Even though a pathogenic variant was not identified, possible explanations for the cancer in the family may include: There may be no hereditary risk for cancer in the family. The cancers in Kerry Phillips and/or her family may be due to  other genetic or environmental factors. There may be a gene mutation in one of these genes that current testing methods cannot detect, but that chance is small. There could be another gene that has not yet been discovered, or that we have not yet tested, that is responsible for the cancer diagnoses in the family.  It is also possible there is a hereditary cause for the cancer in the family that Kerry Phillips did not inherit.  Therefore, it is important to remain in touch with cancer genetics in the future so that we can continue to offer Kerry Phillips the most up to date genetic testing.    ADDITIONAL GENETIC TESTING:  We discussed with Kerry Phillips that her genetic testing was fairly extensive.  If there are genes identified to increase cancer risk that can be analyzed in the future, we would be happy to discuss and coordinate this testing at that time.    CANCER SCREENING RECOMMENDATIONS:  Kerry Phillips test result is considered negative (normal).  This means that we have not identified a hereditary cause for her personal and family history of cancer at this time.  An individual's cancer risk and medical management are not determined by genetic test results alone. Overall cancer risk assessment incorporates additional factors, including personal medical history, family history, and any available genetic information that  may result in a personalized plan for cancer prevention and surveillance. Therefore, it is recommended she continue to follow the cancer management and screening guidelines provided by her oncology and primary healthcare provider.  RECOMMENDATIONS FOR FAMILY MEMBERS:   Since she did not inherit a mutation in a cancer predisposition gene included on this panel, her children could not have inherited a mutation from her in one of these genes. Individuals in this family might be at some increased risk of developing cancer, over the general population risk, due to the family history of cancer. We  recommend women in this family have a yearly mammogram beginning at age 17, or 29 years younger than the earliest onset of cancer, an annual clinical breast exam, and perform monthly breast self-exams.  Other members of the family may still carry a pathogenic variant in one of these genes that Kerry Phillips did not inherit. Based on the family history, we recommend the individuals in the family who have a history of cancer consider genetic counseling and testing.  We do not recommend familial testing for the KIT variant of uncertain significance (VUS).  FOLLOW-UP:  Cancer genetics is a rapidly advancing field and it is possible that new genetic tests will be appropriate for her and/or her family members in the future. We encouraged her to remain in contact with cancer genetics on an annual basis so we can update her personal and family histories and let her know of advances in cancer genetics that may benefit this family.   Our contact number was provided. Kerry Phillips questions were answered to her satisfaction, and she knows she is welcome to call us at anytime with additional questions or concerns.   Lalla Brothers, MS, [facility] Genetic Counselor [email or web address] (P) [phone]

## 2023-03-02 NOTE — Progress Notes (Signed)
 Surgical Instructions   Your procedure is scheduled on Thursday, January 9th, 2025. Report to [facility] Main Entrance A at 5:30 A.M., then check in with the Admitting office. Any questions or running late day of surgery: call [phone]  Questions prior to your surgery date: call [phone], Monday-Friday, 8am-4pm. If you experience any cold or flu symptoms such as cough, fever, chills, shortness of breath, etc. between now and your scheduled surgery, please notify us  at the above number.     Remember:  Do not eat after midnight the night before your surgery   You may drink clear liquids until 4:30 the morning of your surgery.   Clear liquids allowed are: Water, Non-Citrus Juices (without pulp), Carbonated Beverages, Clear Tea (no milk, honey, etc.), Black Coffee Only (NO MILK, CREAM OR POWDERED CREAMER of any kind), and Gatorade.    Take these medicines the morning of surgery with A SIP OF WATER: Levothyroxine  (Synthroid ) Rosuvastatin  (Crestor )   May take these medicines IF NEEDED: Acetaminophen  (Tylenol ) Albuterol  (Ventolin  HFA)    One week prior to surgery, STOP taking any Aspirin (unless otherwise instructed by your surgeon) Aleve, Naproxen, Ibuprofen , Motrin , Advil , Goody's, BC's, all herbal medications, fish oil, and non-prescription vitamins.                     Do NOT Smoke (Tobacco/Vaping) for 24 hours prior to your procedure.  If you use a CPAP at night, you may bring your mask/headgear for your overnight stay.   You will be asked to remove any contacts, glasses, piercing's, hearing aid's, dentures/partials prior to surgery. Please bring cases for these items if needed.    Patients discharged the day of surgery will not be allowed to drive home, and someone needs to stay with them for 24 hours.  SURGICAL WAITING ROOM VISITATION Patients may have no more than 2 support people in the waiting area - these visitors may rotate.   Pre-op nurse will coordinate an  appropriate time for 1 ADULT support person, who may not rotate, to accompany patient in pre-op.  Children under the age of 62 must have an adult with them who is not the patient and must remain in the main waiting area with an adult.  If the patient needs to stay at the hospital during part of their recovery, the visitor guidelines for inpatient rooms apply.  Please refer to the [facility] website for the visitor guidelines for any additional information.   If you received a COVID test during your pre-op visit  it is requested that you wear a mask when out in public, stay away from anyone that may not be feeling well and notify your surgeon if you develop symptoms. If you have been in contact with anyone that has tested positive in the last 10 days please notify you surgeon.      Pre-operative CHG Bathing Instructions   You can play a key role in reducing the risk of infection after surgery. Your skin needs to be as free of germs as possible. You can reduce the number of germs on your skin by washing with CHG (chlorhexidine  gluconate) soap before surgery. CHG is an antiseptic soap that kills germs and continues to kill germs even after washing.   DO NOT use if you have an allergy to chlorhexidine /CHG or antibacterial soaps. If your skin becomes reddened or irritated, stop using the CHG and notify one of our RNs at [phone].  TAKE A SHOWER THE NIGHT BEFORE SURGERY AND THE DAY OF SURGERY    Please keep in mind the following:  DO NOT shave, including legs and underarms, 48 hours prior to surgery.   You may shave your face before/day of surgery.  Place clean sheets on your bed the night before surgery Use a clean washcloth (not used since being washed) for each shower. DO NOT sleep with pet's night before surgery.  CHG Shower Instructions:  Wash your face and private area with normal soap. If you choose to wash your hair, wash first with your normal shampoo.  After you use  shampoo/soap, rinse your hair and body thoroughly to remove shampoo/soap residue.  Turn the water OFF and apply half the bottle of CHG soap to a CLEAN washcloth.  Apply CHG soap ONLY FROM YOUR NECK DOWN TO YOUR TOES (washing for 3-5 minutes)  DO NOT use CHG soap on face, private areas, open wounds, or sores.  Pay special attention to the area where your surgery is being performed.  If you are having back surgery, having someone wash your back for you may be helpful. Wait 2 minutes after CHG soap is applied, then you may rinse off the CHG soap.  Pat dry with a clean towel  Put on clean pajamas    Additional instructions for the day of surgery: DO NOT APPLY any lotions, deodorants, cologne, or perfumes.   Do not wear jewelry or makeup Do not wear nail polish, gel polish, artificial nails, or any other type of covering on natural nails (fingers and toes) Do not bring valuables to the hospital. [facility] is not responsible for valuables/personal belongings. Put on clean/comfortable clothes.  Please brush your teeth.  Ask your nurse before applying any prescription medications to the skin.

## 2023-03-03 ENCOUNTER — Other Ambulatory Visit: Payer: Self-pay

## 2023-03-03 ENCOUNTER — Encounter (HOSPITAL_COMMUNITY)
Admission: RE | Admit: 2023-03-03 | Discharge: 2023-03-03 | Disposition: A | Discharge status: Home / Self Care | Payer: Medicare Other | Source: Ambulatory Visit | Attending: Surgery | Admitting: Surgery

## 2023-03-03 ENCOUNTER — Encounter (HOSPITAL_COMMUNITY): Payer: Self-pay

## 2023-03-03 VITALS — BP 134/70 | HR 92 | Temp 98.4°F | Resp 18 | Ht 64.0 in | Wt 284.9 lb

## 2023-03-03 DIAGNOSIS — Z01818 Encounter for other preprocedural examination: Secondary | ICD-10-CM | POA: Diagnosis not present

## 2023-03-03 DIAGNOSIS — Z6841 Body Mass Index (BMI) 40.0 and over, adult: Secondary | ICD-10-CM | POA: Diagnosis not present

## 2023-03-03 DIAGNOSIS — I10 Essential (primary) hypertension: Secondary | ICD-10-CM | POA: Diagnosis not present

## 2023-03-03 DIAGNOSIS — E039 Hypothyroidism, unspecified: Secondary | ICD-10-CM | POA: Diagnosis not present

## 2023-03-03 DIAGNOSIS — C50911 Malignant neoplasm of unspecified site of right female breast: Secondary | ICD-10-CM | POA: Diagnosis not present

## 2023-03-03 HISTORY — DX: Malignant neoplasm of unspecified site of unspecified female breast: C50.919

## 2023-03-03 LAB — CBC
HCT: 35.8 % — ABNORMAL LOW (ref 36.0–46.0)
Hemoglobin: 11.8 g/dL — ABNORMAL LOW (ref 12.0–15.0)
MCH: 28.4 pg (ref 26.0–34.0)
MCHC: 33 g/dL (ref 30.0–36.0)
MCV: 86.3 fL (ref 80.0–100.0)
Platelets: 323 10*3/uL (ref 150–400)
RBC: 4.15 MIL/uL (ref 3.87–5.11)
RDW: 14 % (ref 11.5–15.5)
WBC: 8.6 10*3/uL (ref 4.0–10.5)
nRBC: 0 % (ref 0.0–0.2)

## 2023-03-03 LAB — BASIC METABOLIC PANEL
Anion gap: 9 (ref 5–15)
BUN: 17 mg/dL (ref 8–23)
CO2: 29 mmol/L (ref 22–32)
Calcium: 8.7 mg/dL — ABNORMAL LOW (ref 8.9–10.3)
Chloride: 101 mmol/L (ref 98–111)
Creatinine, Ser: 0.95 mg/dL (ref 0.44–1.00)
GFR, Estimated: >60 mL/min (ref >60)
Glucose, Bld: 82 mg/dL (ref 70–99)
Potassium: 3.9 mmol/L (ref 3.5–5.1)
Sodium: 139 mmol/L (ref 135–145)

## 2023-03-03 NOTE — Progress Notes (Signed)
 PCP - Melanie Polio, NP Cardiologist - denies  PPM/ICD - denies   Chest x-ray - denies EKG - 03/03/23 Stress Test - denies ECHO - denies Cardiac Cath - denies  Sleep Study - denies  DM- denies  Last dose of GLP1 agonist-  n/a   ASA/Blood Thinner Instructions: n/a   ERAS Protcol - yes, no drink   COVID TEST- n/a   Anesthesia review: yes, breast seed placement  Patient denies shortness of breath, fever, cough and chest pain at PAT appointment   All instructions explained to the patient, with a verbal understanding of the material. Patient agrees to go over the instructions while at home for a better understanding. Patient also instructed to self quarantine after being tested for COVID-19. The opportunity to ask questions was provided.

## 2023-03-06 NOTE — Progress Notes (Signed)
 Anesthesia Chart Review:  Case: [id number] Date/Time: 03/09/23 0715   Procedure: RIGHT BREAST SEED LOCALIZED LUMPECTOMY BRACKETED WITH RIGHT SENTINEL LYMPH NODE MAPPING (Right)   Anesthesia type: General   Pre-op diagnosis: RIGHT BREAST CANCER   [facility] ROOM 09 / [facility]   Surgeons: Vanderbilt Ned, MD       DISCUSSION: Patient is a 68 year old female scheduled for the above procedure.  History includes never smoker, right breast cancer, HTN, thyroidectomy with postsurgical hypothyroidism, hysterectomy.  BMI is consistent with morbid obesity.  RSL is scheduled for 03/07/22 at 1:00 PM.  Anesthesia team to evaluate on the day of surgery.  VS: BP 134/70   Pulse 92   Temp 36.9 C   Resp 18   Ht 5' 4 (1.626 m)   Wt 129.2 kg   SpO2 97%   BMI 48.90 kg/m   PROVIDERS: Valerio Melanie DASEN, NP is PCP  Loretha Ash, MD is HEM-ONC Izell Domino, MD is RAD-ONC  LABS: Labs reviewed: Acceptable for surgery. (all labs ordered are listed, but only abnormal results are displayed)  Labs Reviewed  BASIC METABOLIC PANEL - Abnormal; Notable for the following components:      Result Value   Calcium  8.7 (*)    All other components within normal limits  CBC - Abnormal; Notable for the following components:   Hemoglobin 11.8 (*)    HCT 35.8 (*)    All other components within normal limits    EKG: 03/03/23:  Normal sinus rhythm Low voltage QRS Borderline ECG No previous ECGs available Confirmed by Rolan Barrack [phone]) on 03/04/2023 8:25:54 PM  CV: N/A  Past Medical History:  Diagnosis Date   Arthritis    Breast cancer (HCC)    right   Dysphagia 01/17/2017   History of blood transfusion    with childbirth   Hypertension    Hypothyroidism    surgical    Past Surgical History:  Procedure Laterality Date   ABDOMINAL HYSTERECTOMY     ARTHROSCOPIC HAGLUNDS REPAIR Right 01/04/2019   Procedure: ENDOSCOPIC RESECTION HAGLUNDS DEFORMITY;  Surgeon: Harden Jerona GAILS, MD;  Location: [facility]  OR;  Service: Orthopedics;  Laterality: Right;   BREAST BIOPSY Right 01/06/2023   MM RT BREAST BX W LOC DEV 1ST LESION IMAGE BX SPEC STEREO GUIDE 01/06/2023 [facility] MAMMOGRAPHY   BREAST BIOPSY Right 01/06/2023   MM RT BREAST BX W LOC DEV EA AD LESION IMG BX SPEC STEREO GUIDE 01/06/2023 [facility] MAMMOGR[facility]   COLONOSCOPY     THYROIDECTOMY     90% removed    MEDICATIONS:  acetaminophen  (TYLENOL ) 500 MG tablet   albuterol  (VENTOLIN  HFA) 108 (90 Base) MCG/ACT inhaler   calcitRIOL  (ROCALTROL ) 0.5 MCG capsule   cholecalciferol (VITAMIN D3) 25 MCG (1000 UNIT) tablet   Homeopathic Products (THERAWORX RELIEF EX)   Iron-Vitamins (GERITOL TONIC PO)   levothyroxine  (SYNTHROID ) 100 MCG tablet   Olmesartan -amLODIPine -HCTZ 40-5-12.5 MG TABS   OVER THE COUNTER MEDICATION   rosuvastatin  (CRESTOR ) 10 MG tablet   No current facility-administered medications for this encounter.    Isaiah Ruder, PA-C Surgical Short Stay/Anesthesiology [facility] Phone [phone] [facility] Phone [phone] 03/06/2023 4:14 PM

## 2023-03-06 NOTE — H&P (Signed)
 History of Present Illness: Kerry Phillips is a 68 y.o. female who is seen today as an office consultation for evaluation of Breast Cancer  Patient seen in the [facility] today for newly diagnosed right breast cancer. She went for screening mammogram and had a 15 cm area of right breast microcalcifications in the upper outer quadrant. 2 biopsies were done 1 showed atypical ductal hyperplasia the other showed grade 1 invasive ductal carcinoma ER positive, PR positive, HER2/neu negative with a KI 67 of 2%. She has no complaints today.  Review of Systems: A complete review of systems was obtained from the patient. I have reviewed this information and discussed as appropriate with the patient. See HPI as well for other ROS.    Medical History: Past Medical History:  Diagnosis Date  GERD (gastroesophageal reflux disease)   Patient Active Problem List  Diagnosis  Arthritis of both knees  BMI 45.0-49.9, adult (CMS/HHS-HCC)  Elevated LDL cholesterol level  Essential hypertension  Exercise-induced asthma (HHS-HCC)  Gastroesophageal reflux disease  Hashimoto's thyroiditis  History of colonic polyps  Hypocalcemia  Malignant neoplasm of upper-outer quadrant of right breast in female, estrogen receptor positive (CMS/HHS-HCC)  Morbid obesity (CMS/HHS-HCC)  Stage 3a chronic kidney disease (CKD) (CMS/HHS-HCC)  Vitamin D  deficiency   Past Surgical History:  Procedure Laterality Date  Endoscopic resection haglunds deformity Right 01/04/2019    No Known Allergies  Current Outpatient Medications on File Prior to Visit  Medication Sig Dispense Refill  albuterol  MDI, PROVENTIL , VENTOLIN , PROAIR , HFA 90 mcg/actuation inhaler Inhale 2 Inhalations into the lungs every 6 (six) hours as needed  calcitRIOL  (ROCALTROL ) 0.5 MCG capsule Take 0.5 mcg by mouth 2 (two) times daily  levothyroxine  (SYNTHROID ) 100 MCG tablet Take 1 tablet by mouth once daily  olmesartan -amLODIPine -hydroCHLOROthiazide (TRIBENZOR)  40-5-12.5 mg tablet Take 1 tablet by mouth once daily  rosuvastatin  (CRESTOR ) 10 MG tablet Take 1 tablet by mouth once daily   No current facility-administered medications on file prior to visit.   Family History  Family history unknown: Yes    Social History   Tobacco Use  Smoking Status Unknown  Smokeless Tobacco Not on file    Social History   Socioeconomic History  Marital status: Married  Tobacco Use  Smoking status: Unknown  Vaping Use  Vaping status: Unknown  Substance and Sexual Activity  Alcohol use: Defer  Drug use: Defer   Social Drivers of Health   Financial Resource Strain: Low Risk (04/11/2022)  Received from [facility]  Overall Financial Resource Strain (CARDIA)  Difficulty of Paying Living Expenses: Not hard at all  Food Insecurity: No Food Insecurity (04/11/2022)  Received from [facility]  Hunger Vital Sign  Worried About Running Out of Food in the Last Year: Never true  Ran Out of Food in the Last Year: Never true  Transportation Needs: No Transportation Needs (04/11/2022)  Received from [facility] - Transportation  Lack of Transportation (Medical): No  Lack of Transportation (Non-Medical): No  Physical Activity: Insufficiently Active (04/11/2022)  Received from [facility]  Exercise Vital Sign  Days of Exercise per Week: 3 days  Minutes of Exercise per Session: 30 min  Stress: No Stress Concern Present (04/11/2022)  Received from [facility] Occupational [facility] - Occupational Stress Questionnaire  Feeling of Stress : Not at all  Social Connections: Socially Integrated (04/11/2022)  Received from [facility]  Social Connection and Isolation Panel [NHANES]  Frequency of Communication with Friends and Family: More than three  times a week  Frequency of Social Gatherings with Friends and Family: More than three times a week  Attends Religious Services: More than 4 times per year  Active Member of [employer] or  Organizations: Yes  Attends Engineer, Structural: More than 4 times per year  Marital Status: Married   Objective:  There were no vitals filed for this visit.  There is no height or weight on file to calculate BMI.  Physical Exam Exam conducted with a chaperone present.  HENT:  Head: Normocephalic.  Cardiovascular:  Rate and Rhythm: Normal rate.  Pulmonary:  Effort: Pulmonary effort is normal.  Chest:  Breasts: Right: Mass present.  Left: Normal.  Comments: Large pendulous breast. Fullness noted right breast upper outer quadrant measuring about 10 cm. Musculoskeletal:  General: Normal range of motion.  Lymphadenopathy:  Upper Body:  Right upper body: No axillary adenopathy.  Left upper body: No axillary adenopathy.  Skin: General: Skin is warm.  Neurological:  General: No focal deficit present.  Mental Status: She is alert.  Psychiatric:  Mood and Affect: Mood normal.     Labs, Imaging and Diagnostic Testing: Mammogram right reveals a 15 cm area of calcifications. 2 biopsies were done 1 showing invasive ductal carcinoma grade 1 ER positive, PR positive HER2/neu negative and atypical duct hyperplasia from the large area of a combination of pleomorphic as well as benign calcifications right breast upper outer quadrant.  Assessment and Plan:   Diagnoses and all orders for this visit:  Malignant neoplasm of upper-outer quadrant of right breast in female, estrogen receptor positive (CMS/HHS-HCC)   Patient with very large breasts. She is a good candidate for a lumpectomy with reduction. She is interested due to her large breast size about reduction bilaterally for symmetrical purposes. She would like to try to conserve her breast. The large area of disease but given the location in the upper outer quadrant and her large breast size, I think lumpectomy is feasible. Refer to plastic surgery  Recommend bracketed right breast seed localized lumpectomy with right  axillary sent lymph node mapping  Reviewed mastectomies reconstructions. My concern is that there will be a significant cosmetic deformity in her case due to her large breast size with mastectomy with reconstruction.  Reviewed long-term survival, local regional recurrence and additional therapies. She would like to proceed with breast conserving surgery if feasible  Refer to plastic surgery for opinion about this  Reviewed risk of bleeding, infection, flap necrosis, lymphedema, arm stiffness, injury to major blood vessels and nerves, and the need for additional therapies as well as exacerbation of underlying medical problems and anesthesia risk.   DEBBY CURTISTINE SHIPPER, MD

## 2023-03-06 NOTE — Anesthesia Preprocedure Evaluation (Addendum)
 Anesthesia Evaluation  Patient identified by MRN, date of birth, ID band Patient awake    Reviewed: Allergy & Precautions, H&P , NPO status , Patient's Chart, lab work & pertinent test results  Airway Mallampati: III  TM Distance: >3 FB Neck ROM: Full    Dental no notable dental hx. (+) Teeth Intact, Dental Advisory Given   Pulmonary neg pulmonary ROS   Pulmonary exam normal breath sounds clear to auscultation       Cardiovascular Exercise Tolerance: Good hypertension, Pt. on medications  Rhythm:Regular Rate:Normal     Neuro/Psych negative neurological ROS  negative psych ROS   GI/Hepatic negative GI ROS, Neg liver ROS,,,  Endo/Other  Hypothyroidism  Class 3 obesity  Renal/GU Renal disease  negative genitourinary   Musculoskeletal   Abdominal   Peds  Hematology negative hematology ROS (+)   Anesthesia Other Findings   Reproductive/Obstetrics negative OB ROS                             Anesthesia Physical Anesthesia Plan  ASA: 3  Anesthesia Plan: General   Post-op Pain Management: Regional block* and Tylenol  PO (pre-op)*   Induction: Intravenous  PONV Risk Score and Plan: 4 or greater and Ondansetron , Dexamethasone  and Treatment may vary due to age or medical condition  Airway Management Planned: Oral ETT  Additional Equipment:   Intra-op Plan:   Post-operative Plan: Extubation in OR  Informed Consent: I have reviewed the patients History and Physical, chart, labs and discussed the procedure including the risks, benefits and alternatives for the proposed anesthesia with the patient or authorized representative who has indicated his/her understanding and acceptance.     Dental advisory given  Plan Discussed with: CRNA  Anesthesia Plan Comments: (PAT note written 03/06/2023 by Allison Zelenak, PA-C.  )       Anesthesia Quick Evaluation

## 2023-03-08 ENCOUNTER — Ambulatory Visit
Admission: RE | Admit: 2023-03-08 | Discharge: 2023-03-08 | Disposition: A | Discharge status: Home / Self Care | Payer: Medicare Other | Source: Ambulatory Visit | Attending: Surgery | Admitting: Surgery

## 2023-03-08 DIAGNOSIS — C50911 Malignant neoplasm of unspecified site of right female breast: Secondary | ICD-10-CM

## 2023-03-08 HISTORY — PX: BREAST BIOPSY: SHX20

## 2023-03-09 ENCOUNTER — Other Ambulatory Visit: Payer: Self-pay

## 2023-03-09 ENCOUNTER — Ambulatory Visit (HOSPITAL_COMMUNITY): Payer: Medicare Other | Admitting: Vascular Surgery

## 2023-03-09 ENCOUNTER — Ambulatory Visit
Admission: RE | Admit: 2023-03-09 | Discharge: 2023-03-09 | Disposition: A | Discharge status: Home / Self Care | Payer: Medicare Other | Source: Ambulatory Visit | Attending: Surgery | Admitting: Surgery

## 2023-03-09 ENCOUNTER — Encounter (HOSPITAL_COMMUNITY): Payer: Self-pay | Admitting: Surgery

## 2023-03-09 ENCOUNTER — Ambulatory Visit (HOSPITAL_COMMUNITY)
Admission: RE | Admit: 2023-03-09 | Discharge: 2023-03-09 | Disposition: A | Discharge status: Home / Self Care | Payer: Medicare Other | Attending: Surgery | Admitting: Surgery

## 2023-03-09 ENCOUNTER — Ambulatory Visit (HOSPITAL_COMMUNITY): Payer: Medicare Other | Admitting: Anesthesiology

## 2023-03-09 ENCOUNTER — Encounter (HOSPITAL_COMMUNITY)
Admission: RE | Disposition: A | Discharge status: Home / Self Care | Payer: Self-pay | Source: Home / Self Care | Attending: Surgery

## 2023-03-09 DIAGNOSIS — Z6841 Body Mass Index (BMI) 40.0 and over, adult: Secondary | ICD-10-CM | POA: Insufficient documentation

## 2023-03-09 DIAGNOSIS — Z1721 Progesterone receptor positive status: Secondary | ICD-10-CM | POA: Diagnosis not present

## 2023-03-09 DIAGNOSIS — C50411 Malignant neoplasm of upper-outer quadrant of right female breast: Secondary | ICD-10-CM

## 2023-03-09 DIAGNOSIS — Z170 Estrogen receptor positive status [ER+]: Secondary | ICD-10-CM | POA: Diagnosis not present

## 2023-03-09 DIAGNOSIS — E063 Autoimmune thyroiditis: Secondary | ICD-10-CM | POA: Insufficient documentation

## 2023-03-09 DIAGNOSIS — K219 Gastro-esophageal reflux disease without esophagitis: Secondary | ICD-10-CM | POA: Diagnosis not present

## 2023-03-09 DIAGNOSIS — N1831 Chronic kidney disease, stage 3a: Secondary | ICD-10-CM | POA: Diagnosis not present

## 2023-03-09 DIAGNOSIS — I129 Hypertensive chronic kidney disease with stage 1 through stage 4 chronic kidney disease, or unspecified chronic kidney disease: Secondary | ICD-10-CM

## 2023-03-09 DIAGNOSIS — C50911 Malignant neoplasm of unspecified site of right female breast: Secondary | ICD-10-CM

## 2023-03-09 HISTORY — PX: BREAST LUMPECTOMY WITH RADIOACTIVE SEED AND SENTINEL LYMPH NODE BIOPSY: SHX6550

## 2023-03-09 SURGERY — BREAST LUMPECTOMY WITH RADIOACTIVE SEED AND SENTINEL LYMPH NODE BIOPSY
Anesthesia: General | Site: Breast | Laterality: Right

## 2023-03-09 MED ORDER — LACTATED RINGERS IV SOLN: INTRAVENOUS | Status: DC

## 2023-03-09 MED ORDER — ATROPINE SULFATE 0.4 MG/ML IV SOLN
INTRAVENOUS | Status: AC
  Filled 2023-03-09: qty 2

## 2023-03-09 MED ORDER — SODIUM CHLORIDE 0.9 % IV SOLN
3.0000 g | INTRAVENOUS | Status: AC
  Administered 2023-03-09: 3 g via INTRAVENOUS
  Filled 2023-03-09: qty 3

## 2023-03-09 MED ORDER — PROPOFOL 10 MG/ML IV BOLUS
INTRAVENOUS | Status: AC
  Filled 2023-03-09: qty 20

## 2023-03-09 MED ORDER — EPINEPHRINE 1 MG/10ML IJ SOSY
PREFILLED_SYRINGE | INTRAMUSCULAR | Status: AC
  Filled 2023-03-09: qty 10

## 2023-03-09 MED ORDER — PROPOFOL 10 MG/ML IV BOLUS
INTRAVENOUS | Status: DC | PRN
  Administered 2023-03-09: 130 mg via INTRAVENOUS

## 2023-03-09 MED ORDER — DEXAMETHASONE SODIUM PHOSPHATE 10 MG/ML IJ SOLN
INTRAMUSCULAR | Status: DC | PRN
  Administered 2023-03-09: 5 mg via INTRAVENOUS

## 2023-03-09 MED ORDER — BUPIVACAINE-EPINEPHRINE (PF) 0.25% -1:200000 IJ SOLN
INTRAMUSCULAR | Status: AC
  Filled 2023-03-09: qty 30

## 2023-03-09 MED ORDER — SUGAMMADEX SODIUM 200 MG/2ML IV SOLN
INTRAVENOUS | Status: DC | PRN
  Administered 2023-03-09: 100 mg via INTRAVENOUS
  Administered 2023-03-09: 300 mg via INTRAVENOUS

## 2023-03-09 MED ORDER — FENTANYL CITRATE (PF) 250 MCG/5ML IJ SOLN
INTRAMUSCULAR | Status: AC
  Filled 2023-03-09: qty 5

## 2023-03-09 MED ORDER — CHLORHEXIDINE GLUCONATE CLOTH 2 % EX PADS: 6.0000 | MEDICATED_PAD | Freq: Once | CUTANEOUS | Status: DC

## 2023-03-09 MED ORDER — MIDAZOLAM HCL 2 MG/2ML IJ SOLN
INTRAMUSCULAR | Status: AC
Start: 2023-03-09 — End: ?
  Filled 2023-03-09: qty 2

## 2023-03-09 MED ORDER — GABAPENTIN 300 MG PO CAPS
300.0000 mg | ORAL_CAPSULE | ORAL | Status: AC
  Administered 2023-03-09: 300 mg via ORAL
  Filled 2023-03-09: qty 1

## 2023-03-09 MED ORDER — OXYCODONE HCL 5 MG PO TABS: 5.0000 mg | ORAL_TABLET | Freq: Four times a day (QID) | ORAL | 0 refills | Status: DC | PRN

## 2023-03-09 MED ORDER — PHENYLEPHRINE HCL-NACL 20-0.9 MG/250ML-% IV SOLN
INTRAVENOUS | Status: DC | PRN
  Administered 2023-03-09: 25 ug/min via INTRAVENOUS

## 2023-03-09 MED ORDER — ONDANSETRON HCL 4 MG/2ML IJ SOLN
INTRAMUSCULAR | Status: AC
  Filled 2023-03-09: qty 4

## 2023-03-09 MED ORDER — FENTANYL CITRATE (PF) 250 MCG/5ML IJ SOLN
INTRAMUSCULAR | Status: DC | PRN
  Administered 2023-03-09 (×3): 50 ug via INTRAVENOUS

## 2023-03-09 MED ORDER — 0.9 % SODIUM CHLORIDE (POUR BTL) OPTIME
TOPICAL | Status: DC | PRN
  Administered 2023-03-09: 1000 mL

## 2023-03-09 MED ORDER — BUPIVACAINE LIPOSOME 1.3 % IJ SUSP
INTRAMUSCULAR | Status: DC | PRN
  Administered 2023-03-09: 10 mL via PERINEURAL

## 2023-03-09 MED ORDER — MAGTRACE LYMPHATIC TRACER
INTRAMUSCULAR | Status: DC | PRN
  Administered 2023-03-09: 2 mL via INTRAMUSCULAR

## 2023-03-09 MED ORDER — SUGAMMADEX SODIUM 200 MG/2ML IV SOLN: INTRAVENOUS | Status: DC | PRN

## 2023-03-09 MED ORDER — BUPIVACAINE LIPOSOME 1.3 % IJ SUSP
INTRAMUSCULAR | Status: AC
  Filled 2023-03-09: qty 10

## 2023-03-09 MED ORDER — DEXAMETHASONE SODIUM PHOSPHATE 10 MG/ML IJ SOLN
INTRAMUSCULAR | Status: AC
  Filled 2023-03-09: qty 2

## 2023-03-09 MED ORDER — ACETAMINOPHEN 500 MG PO TABS: 1000.0000 mg | ORAL_TABLET | Freq: Once | ORAL | Status: DC

## 2023-03-09 MED ORDER — CHLORHEXIDINE GLUCONATE 0.12 % MT SOLN: 15.0000 mL | Freq: Once | OROMUCOSAL | Status: AC

## 2023-03-09 MED ORDER — ONDANSETRON HCL 4 MG/2ML IJ SOLN
INTRAMUSCULAR | Status: DC | PRN
  Administered 2023-03-09: 4 mg via INTRAVENOUS

## 2023-03-09 MED ORDER — HEMOSTATIC AGENTS (NO CHARGE) OPTIME
TOPICAL | Status: DC | PRN
  Administered 2023-03-09: 1 via TOPICAL

## 2023-03-09 MED ORDER — ORAL CARE MOUTH RINSE: 15.0000 mL | Freq: Once | OROMUCOSAL | Status: AC

## 2023-03-09 MED ORDER — LIDOCAINE 2% (20 MG/ML) 5 ML SYRINGE
INTRAMUSCULAR | Status: AC
  Filled 2023-03-09: qty 5

## 2023-03-09 MED ORDER — HYDROMORPHONE HCL 1 MG/ML IJ SOLN: 0.2500 mg | INTRAMUSCULAR | Status: DC | PRN

## 2023-03-09 MED ORDER — SUCCINYLCHOLINE CHLORIDE 200 MG/10ML IV SOSY
PREFILLED_SYRINGE | INTRAVENOUS | Status: AC
  Filled 2023-03-09: qty 20

## 2023-03-09 MED ORDER — ROCURONIUM BROMIDE 10 MG/ML (PF) SYRINGE
PREFILLED_SYRINGE | INTRAVENOUS | Status: DC | PRN
  Administered 2023-03-09: 50 mg via INTRAVENOUS

## 2023-03-09 MED ORDER — BUPIVACAINE-EPINEPHRINE 0.25% -1:200000 IJ SOLN
INTRAMUSCULAR | Status: DC | PRN
  Administered 2023-03-09: 12 mL

## 2023-03-09 MED ORDER — PHENYLEPHRINE 80 MCG/ML (10ML) SYRINGE FOR IV PUSH (FOR BLOOD PRESSURE SUPPORT)
PREFILLED_SYRINGE | INTRAVENOUS | Status: AC
  Filled 2023-03-09: qty 10

## 2023-03-09 MED ORDER — ACETAMINOPHEN 500 MG PO TABS
1000.0000 mg | ORAL_TABLET | ORAL | Status: AC
Start: 2023-03-09 — End: 2023-03-09
  Administered 2023-03-09: 1000 mg via ORAL
  Filled 2023-03-09: qty 2

## 2023-03-09 MED ORDER — ROCURONIUM BROMIDE 10 MG/ML (PF) SYRINGE
PREFILLED_SYRINGE | INTRAVENOUS | Status: AC
  Filled 2023-03-09: qty 10

## 2023-03-09 MED ORDER — KETOROLAC TROMETHAMINE 30 MG/ML IJ SOLN
INTRAMUSCULAR | Status: AC
  Filled 2023-03-09: qty 1

## 2023-03-09 MED ORDER — MIDAZOLAM HCL 5 MG/5ML IJ SOLN
INTRAMUSCULAR | Status: DC | PRN
  Administered 2023-03-09 (×2): 1 mg via INTRAVENOUS

## 2023-03-09 MED ORDER — CHLORHEXIDINE GLUCONATE 0.12 % MT SOLN
OROMUCOSAL | Status: AC
  Administered 2023-03-09: 15 mL via OROMUCOSAL
  Filled 2023-03-09: qty 15

## 2023-03-09 MED ORDER — IBUPROFEN 800 MG PO TABS: 800.0000 mg | ORAL_TABLET | Freq: Three times a day (TID) | ORAL | 0 refills | Status: DC | PRN

## 2023-03-09 MED ORDER — BUPIVACAINE-EPINEPHRINE (PF) 0.5% -1:200000 IJ SOLN
INTRAMUSCULAR | Status: DC | PRN
  Administered 2023-03-09: 20 mL via PERINEURAL

## 2023-03-09 MED ORDER — LIDOCAINE 2% (20 MG/ML) 5 ML SYRINGE
INTRAMUSCULAR | Status: DC | PRN
  Administered 2023-03-09: 60 mg via INTRAVENOUS

## 2023-03-09 MED ORDER — PHENYLEPHRINE 80 MCG/ML (10ML) SYRINGE FOR IV PUSH (FOR BLOOD PRESSURE SUPPORT)
PREFILLED_SYRINGE | INTRAVENOUS | Status: DC | PRN
  Administered 2023-03-09: 80 ug via INTRAVENOUS
  Administered 2023-03-09 (×2): 160 ug via INTRAVENOUS
  Administered 2023-03-09 (×3): 80 ug via INTRAVENOUS
  Administered 2023-03-09: 160 ug via INTRAVENOUS

## 2023-03-09 SURGICAL SUPPLY — 42 items
APPLIER CLIP 9.375 MED OPEN (MISCELLANEOUS) ×1 IMPLANT
BAG COUNTER SPONGE SURGICOUNT (BAG) IMPLANT
BINDER BREAST 3XL (GAUZE/BANDAGES/DRESSINGS) IMPLANT
BIOPATCH RED 1 DISK 7.0 (GAUZE/BANDAGES/DRESSINGS) IMPLANT
CANISTER SUCT 3000ML PPV (MISCELLANEOUS) ×1 IMPLANT
CHLORAPREP W/TINT 26 (MISCELLANEOUS) ×1 IMPLANT
CLIP APPLIE 9.375 MED OPEN (MISCELLANEOUS) ×1 IMPLANT
CNTNR URN SCR LID CUP LEK RST (MISCELLANEOUS) IMPLANT
COVER PROBE W GEL 5X96 (DRAPES) ×1 IMPLANT
COVER SURGICAL LIGHT HANDLE (MISCELLANEOUS) ×1 IMPLANT
DERMABOND ADVANCED .7 DNX12 (GAUZE/BANDAGES/DRESSINGS) ×1 IMPLANT
DEVICE DUBIN SPECIMEN MAMMOGRA (MISCELLANEOUS) ×1 IMPLANT
DRAIN CHANNEL 19F RND (DRAIN) IMPLANT
DRAPE CHEST BREAST 15X10 FENES (DRAPES) ×1 IMPLANT
DRSG TEGADERM 2-3/8X2-3/4 SM (GAUZE/BANDAGES/DRESSINGS) IMPLANT
ELECT CAUTERY BLADE 6.4 (BLADE) ×1 IMPLANT
ELECT REM PT RETURN 9FT ADLT (ELECTROSURGICAL) ×1 IMPLANT
ELECTRODE REM PT RTRN 9FT ADLT (ELECTROSURGICAL) ×1 IMPLANT
EVACUATOR SILICONE 100CC (DRAIN) IMPLANT
GAUZE PAD ABD 8X10 STRL (GAUZE/BANDAGES/DRESSINGS) ×1 IMPLANT
GLOVE BIO SURGEON STRL SZ8 (GLOVE) ×1 IMPLANT
GLOVE BIOGEL PI IND STRL 8 (GLOVE) ×1 IMPLANT
GOWN STRL REUS W/ TWL LRG LVL3 (GOWN DISPOSABLE) ×1 IMPLANT
GOWN STRL REUS W/ TWL XL LVL3 (GOWN DISPOSABLE) ×1 IMPLANT
HEMOSTAT ARISTA ABSORB 3G PWDR (HEMOSTASIS) IMPLANT
KIT BASIN OR (CUSTOM PROCEDURE TRAY) ×1 IMPLANT
KIT MARKER MARGIN INK (KITS) ×1 IMPLANT
NDL 18GX1X1/2 (RX/OR ONLY) (NEEDLE) IMPLANT
NDL FILTER BLUNT 18X1 1/2 (NEEDLE) IMPLANT
NDL HYPO 25GX1X1/2 BEV (NEEDLE) ×1 IMPLANT
NEEDLE 18GX1X1/2 (RX/OR ONLY) (NEEDLE) IMPLANT
NEEDLE FILTER BLUNT 18X1 1/2 (NEEDLE) ×1 IMPLANT
NEEDLE HYPO 25GX1X1/2 BEV (NEEDLE) ×2 IMPLANT
NS IRRIG 1000ML POUR BTL (IV SOLUTION) ×1 IMPLANT
PACK GENERAL/GYN (CUSTOM PROCEDURE TRAY) ×1 IMPLANT
SUT ETHILON 2 0 FS 18 (SUTURE) IMPLANT
SUT MNCRL AB 4-0 PS2 18 (SUTURE) ×1 IMPLANT
SUT VIC AB 2-0 SH 18 (SUTURE) IMPLANT
SUT VIC AB 3-0 SH 18 (SUTURE) ×1 IMPLANT
SYR 3ML LL SCALE MARK (SYRINGE) IMPLANT
SYR CONTROL 10ML LL (SYRINGE) ×1 IMPLANT
TOWEL GREEN STERILE FF (TOWEL DISPOSABLE) ×1 IMPLANT

## 2023-03-09 NOTE — Interval H&P Note (Signed)
 History and Physical Interval Note:  03/09/2023 7:16 AM  Kerry Phillips  has presented today for surgery, with the diagnosis of RIGHT BREAST CANCER.  The various methods of treatment have been discussed with the patient and family. After consideration of risks, benefits and other options for treatment, the patient has consented to  Procedure(s): RIGHT BREAST SEED LOCALIZED LUMPECTOMY BRACKETED WITH RIGHT SENTINEL LYMPH NODE MAPPING (Right) as a surgical intervention.  The patient's history has been reviewed, patient examined, no change in status, stable for surgery.  I have reviewed the patient's chart and labs.  Questions were answered to the patient's satisfaction.   The procedure has been discussed with the patient. Alternatives to surgery have been discussed with the patient.  Risks of surgery include bleeding,  Infection,  Seroma formation, death,  and the need for further surgery.   The patient understands and wishes to proceed. Sentinel lymph node mapping and dissection has been discussed with the patient.  Risk of bleeding,  Infection,  Seroma formation,  Additional procedures,,  Shoulder weakness ,  Shoulder stiffness,  Nerve and blood vessel injury and reaction to the mapping dyes have been discussed.  Alternatives to surgery have been discussed with the patient.  The patient agrees to proceed.   Kmari Halter A Tyrece Vanterpool

## 2023-03-09 NOTE — Op Note (Signed)
 Preoperative diagnosis: Stage II right breast cancer upper outer quadrant ER positive  Postoperative diagnosis: Same    Procedure: Right breast bracketed seed localized lumpectomy with deep right axillary sentinel lymph node mapping using mag trace  Surgeon: Debby Shipper, MD  Anesthesia: LMA with pectoral block and 0.25% Marcaine  with epinephrine   EBL: Minimal  Specimen: Right breast tissue with 2 seeds and 2 clips verified by Faxitron, 4 right axillary sentinel nodes sent to pathology  Drains: 19 round  Indications for procedure: The patient is a 68 year old female with a large area of microcalcifications in the right upper outer quadrant of her breast.  Core biopsy showed invasive ductal carcinoma and atypical ductal hyperplasia in 2 separate spots.  She is to undergo breast reduction and presents for a bracketed lumpectomy since she desired breast conserving surgery with breast reduction.  She has been marked by plastic surgery and evaluated.The procedure has been discussed with the patient. Alternatives to surgery have been discussed with the patient.  Risks of surgery include bleeding,  Infection,  Seroma formation, death,  and the need for further surgery.   The patient understands and wishes to proceed. Sentinel lymph node mapping and dissection has been discussed with the patient.  Risk of bleeding,  Infection,  Seroma formation,  Additional procedures,,  Shoulder weakness ,  Shoulder stiffness,  Nerve and blood vessel injury and reaction to the mapping dyes have been discussed.  Alternatives to surgery have been discussed with the patient.  The patient agrees to proceed.    Description of procedure: The patient was met in the holding area questions were answered she was taken back to the operative room placed supine upon the operating room table.  After induction of general anesthesia, under sterile conditions 2 cc of mag trace were injected and massaged for 5 minutes.  The right  breast was then prepped and draped in sterile fashion and a second timeout performed.  Films were available for review.  The neoprobe was used to identify both seeds and these were marked on the skin.  Of note the reduction pattern was marked by the plastic surgeon we discussed appropriate incision placement preoperative.  An incision was made in the lateral right breast.  Dissection was carried down to both seeds 1 was more in the central portion of the breast at about 9:00 the second was in the right breast upper outer quadrant at about 10.  There is a large area of calcifications spanning both.  I opted to excise both and a bracketed approach as 1 specimen.  The tissue was removed and oriented with ink.  Imaging revealed the seed and clip to be present in both specimens.  Irrigation was used.  Hemostasis achieved with cautery and the deep tissue planes were approximated with 3-0 Vicryl.  4 Monocryl was used to close the skin.  The mag trace probe was used.  Increased uptake was identified in the right axilla.  A 5 cm incision was made along the inferior border of the right axilla.  Dissection was carried down through the deep subcutaneous fat into the right Eksir contents.  Signals were identified in the deep level 1 basin but also she had some activity in the subpectoral region.  These areas were dissected out.  Identified 4 sentinel lymph nodes.  These were removed.  There were no significant background spikes afterwards.  Bleeding was controlled with small clips and Arista.  Given the deep nature of the of the lymph node biopsy, 19  round drain was placed.  This was brought out through a separate stab incision and secured to the skin with 2-0 nylon.  After ensuring hemostasis, the deep tissue planes were approximated with 3-0 Vicryl.  4 Monocryl was used to close the skin in a subcuticular fashion.  Dermabond applied.  Breast binder placed.  All counts found to be correct.  The patient was awoke extubated  taken to recovery in satisfactory condition.

## 2023-03-09 NOTE — Transfer of Care (Signed)
 Immediate Anesthesia Transfer of Care Note  Patient: Kerry Phillips  Procedure(s) Performed: RIGHT BREAST SEED LOCALIZED LUMPECTOMY BRACKETED WITH RIGHT SENTINEL LYMPH NODE MAPPING (Right: Breast)  Patient Location: PACU  Anesthesia Type:GA combined with regional for post-op pain  Level of Consciousness: drowsy  Airway & Oxygen Therapy: Patient Spontanous Breathing and Patient connected to face mask oxygen  Post-op Assessment: Report given to RN and Post -op Vital signs reviewed and stable  Post vital signs: Reviewed and stable  Last Vitals:  Vitals Value Taken Time  BP 118/56 03/09/23 0940  Temp    Pulse 88 03/09/23 0942  Resp 24 03/09/23 0942  SpO2 94 % 03/09/23 0942  Vitals shown include unfiled device data.  Last Pain:  Vitals:   03/09/23 0607  PainSc: 0-No pain         Complications: No notable events documented.

## 2023-03-09 NOTE — Anesthesia Procedure Notes (Signed)
 Procedure Name: Intubation Date/Time: 03/09/2023 7:38 AM  Performed by: Claudene Arlin LABOR, CRNAPre-anesthesia Checklist: Patient identified, Emergency Drugs available, Suction available and Patient being monitored Patient Re-evaluated:Patient Re-evaluated prior to induction Oxygen Delivery Method: Circle system utilized Preoxygenation: Pre-oxygenation with 100% oxygen Induction Type: IV induction Ventilation: Mask ventilation without difficulty Laryngoscope Size: Miller and 2 Grade View: Grade I Tube type: Oral Tube size: 7.0 mm Number of attempts: 1 Airway Equipment and Method: Stylet Placement Confirmation: ETT inserted through vocal cords under direct vision, positive ETCO2 and breath sounds checked- equal and bilateral Secured at: 21 cm Tube secured with: Tape Dental Injury: Teeth and Oropharynx as per pre-operative assessment

## 2023-03-09 NOTE — Discharge Instructions (Signed)

## 2023-03-09 NOTE — Anesthesia Procedure Notes (Signed)
 Anesthesia Regional Block: Pectoralis block   Pre-Anesthetic Checklist: , timeout performed,  Correct Patient, Correct Site, Correct Laterality,  Correct Procedure, Correct Position, site marked,  Risks and benefits discussed,  Pre-op evaluation,  At surgeon's request and post-op pain management  Laterality: Right  Prep: Maximum Sterile Barrier Precautions used, chloraprep       Needles:  Injection technique: Single-shot  Needle Type: Echogenic Stimulator Needle     Needle Length: 9cm  Needle Gauge: 21     Additional Needles:   Procedures:,,,, ultrasound used (permanent image in chart),,    Narrative:  Start time: 03/09/2023 7:15 AM End time: 03/09/2023 7:17 AM Injection made incrementally with aspirations every 5 mL. Anesthesiologist: Niels Marien CROME, MD

## 2023-03-09 NOTE — Anesthesia Postprocedure Evaluation (Signed)
 Anesthesia Post Note  Patient: MCKAILA DUFFUS  Procedure(s) Performed: RIGHT BREAST SEED LOCALIZED LUMPECTOMY BRACKETED WITH RIGHT SENTINEL LYMPH NODE MAPPING (Right: Breast)     Patient location during evaluation: PACU Anesthesia Type: General and Regional Level of consciousness: awake and alert Pain management: pain level controlled Vital Signs Assessment: post-procedure vital signs reviewed and stable Respiratory status: spontaneous breathing, nonlabored ventilation and respiratory function stable Cardiovascular status: blood pressure returned to baseline and stable Postop Assessment: no apparent nausea or vomiting Anesthetic complications: no  No notable events documented.  Last Vitals:  Vitals:   03/09/23 0945 03/09/23 1000  BP: 122/62 123/66  Pulse: 85 85  Resp: 16 20  Temp:    SpO2: 93% 95%    Last Pain:  Vitals:   03/09/23 1000  PainSc: 0-No pain                 Catie Chiao,W. EDMOND

## 2023-03-10 ENCOUNTER — Encounter (HOSPITAL_COMMUNITY): Payer: Self-pay | Admitting: Surgery

## 2023-03-13 ENCOUNTER — Encounter (HOSPITAL_BASED_OUTPATIENT_CLINIC_OR_DEPARTMENT_OTHER): Payer: Self-pay | Admitting: Plastic Surgery

## 2023-03-15 ENCOUNTER — Encounter: Payer: Self-pay | Admitting: Surgery

## 2023-03-15 LAB — SURGICAL PATHOLOGY

## 2023-03-16 ENCOUNTER — Telehealth: Payer: Self-pay | Admitting: *Deleted

## 2023-03-16 ENCOUNTER — Encounter: Payer: Self-pay | Admitting: *Deleted

## 2023-03-16 NOTE — Telephone Encounter (Signed)
Received order for oncotype testing. Requisition sent to pathology 

## 2023-03-17 ENCOUNTER — Ambulatory Visit (HOSPITAL_BASED_OUTPATIENT_CLINIC_OR_DEPARTMENT_OTHER): Payer: Medicare Other | Admitting: Anesthesiology

## 2023-03-17 ENCOUNTER — Ambulatory Visit (HOSPITAL_BASED_OUTPATIENT_CLINIC_OR_DEPARTMENT_OTHER)
Admission: RE | Admit: 2023-03-17 | Discharge: 2023-03-17 | Disposition: A | Discharge status: Home / Self Care | Payer: Medicare Other | Attending: Plastic Surgery | Admitting: Plastic Surgery

## 2023-03-17 ENCOUNTER — Other Ambulatory Visit: Payer: Self-pay

## 2023-03-17 ENCOUNTER — Encounter (HOSPITAL_BASED_OUTPATIENT_CLINIC_OR_DEPARTMENT_OTHER): Payer: Self-pay | Admitting: Plastic Surgery

## 2023-03-17 ENCOUNTER — Encounter (HOSPITAL_BASED_OUTPATIENT_CLINIC_OR_DEPARTMENT_OTHER)
Admission: RE | Disposition: A | Discharge status: Home / Self Care | Payer: Self-pay | Source: Home / Self Care | Attending: Plastic Surgery

## 2023-03-17 DIAGNOSIS — C50411 Malignant neoplasm of upper-outer quadrant of right female breast: Secondary | ICD-10-CM | POA: Insufficient documentation

## 2023-03-17 DIAGNOSIS — Z9889 Other specified postprocedural states: Secondary | ICD-10-CM | POA: Insufficient documentation

## 2023-03-17 DIAGNOSIS — Z170 Estrogen receptor positive status [ER+]: Secondary | ICD-10-CM | POA: Diagnosis not present

## 2023-03-17 DIAGNOSIS — I129 Hypertensive chronic kidney disease with stage 1 through stage 4 chronic kidney disease, or unspecified chronic kidney disease: Secondary | ICD-10-CM | POA: Diagnosis not present

## 2023-03-17 DIAGNOSIS — Z1721 Progesterone receptor positive status: Secondary | ICD-10-CM | POA: Insufficient documentation

## 2023-03-17 DIAGNOSIS — N1831 Chronic kidney disease, stage 3a: Secondary | ICD-10-CM | POA: Diagnosis not present

## 2023-03-17 DIAGNOSIS — I10 Essential (primary) hypertension: Secondary | ICD-10-CM | POA: Insufficient documentation

## 2023-03-17 DIAGNOSIS — Z421 Encounter for breast reconstruction following mastectomy: Secondary | ICD-10-CM | POA: Insufficient documentation

## 2023-03-17 DIAGNOSIS — Z01818 Encounter for other preprocedural examination: Secondary | ICD-10-CM

## 2023-03-17 HISTORY — PX: BREAST RECONSTRUCTION: SHX9

## 2023-03-17 HISTORY — PX: BREAST REDUCTION SURGERY: SHX8

## 2023-03-17 SURGERY — RECONSTRUCTION, BREAST
Anesthesia: General | Site: Breast | Laterality: Right

## 2023-03-17 MED ORDER — PROPOFOL 10 MG/ML IV BOLUS
INTRAVENOUS | Status: AC
  Filled 2023-03-17: qty 20

## 2023-03-17 MED ORDER — OXYCODONE HCL 5 MG/5ML PO SOLN: 5.0000 mg | Freq: Once | ORAL | Status: DC | PRN

## 2023-03-17 MED ORDER — DEXAMETHASONE SODIUM PHOSPHATE 10 MG/ML IJ SOLN
INTRAMUSCULAR | Status: DC | PRN
  Administered 2023-03-17: 10 mg via INTRAVENOUS

## 2023-03-17 MED ORDER — ACETAMINOPHEN 325 MG PO TABS: 325.0000 mg | ORAL_TABLET | ORAL | Status: DC | PRN

## 2023-03-17 MED ORDER — ROCURONIUM BROMIDE 100 MG/10ML IV SOLN
INTRAVENOUS | Status: DC | PRN
  Administered 2023-03-17: 20 mg via INTRAVENOUS
  Administered 2023-03-17: 60 mg via INTRAVENOUS

## 2023-03-17 MED ORDER — GABAPENTIN 300 MG PO CAPS
ORAL_CAPSULE | ORAL | Status: AC
Start: 2023-03-17 — End: ?
  Filled 2023-03-17: qty 1

## 2023-03-17 MED ORDER — BUPIVACAINE HCL (PF) 0.5 % IJ SOLN
INTRAMUSCULAR | Status: DC | PRN
  Administered 2023-03-17: 30 mL

## 2023-03-17 MED ORDER — ACETAMINOPHEN 10 MG/ML IV SOLN
1000.0000 mg | Freq: Once | INTRAVENOUS | Status: DC | PRN
Start: 2023-03-17 — End: 2023-03-17

## 2023-03-17 MED ORDER — LACTATED RINGERS IV SOLN: INTRAVENOUS | Status: DC

## 2023-03-17 MED ORDER — FENTANYL CITRATE (PF) 100 MCG/2ML IJ SOLN: 25.0000 ug | INTRAMUSCULAR | Status: DC | PRN

## 2023-03-17 MED ORDER — DROPERIDOL 2.5 MG/ML IJ SOLN: 0.6250 mg | Freq: Once | INTRAMUSCULAR | Status: DC | PRN

## 2023-03-17 MED ORDER — SODIUM CHLORIDE 0.9 % IV SOLN
INTRAVENOUS | Status: AC
  Filled 2023-03-17: qty 3

## 2023-03-17 MED ORDER — MIDAZOLAM HCL 2 MG/2ML IJ SOLN
INTRAMUSCULAR | Status: AC
  Filled 2023-03-17: qty 2

## 2023-03-17 MED ORDER — ACETAMINOPHEN 500 MG PO TABS
ORAL_TABLET | ORAL | Status: AC
Start: 2023-03-17 — End: ?
  Filled 2023-03-17: qty 2

## 2023-03-17 MED ORDER — PHENYLEPHRINE HCL (PRESSORS) 10 MG/ML IV SOLN
INTRAVENOUS | Status: DC | PRN
  Administered 2023-03-17: 80 ug via INTRAVENOUS
  Administered 2023-03-17: 160 ug via INTRAVENOUS
  Administered 2023-03-17: 80 ug via INTRAVENOUS

## 2023-03-17 MED ORDER — ONDANSETRON HCL 4 MG/2ML IJ SOLN
INTRAMUSCULAR | Status: DC | PRN
  Administered 2023-03-17: 4 mg via INTRAVENOUS

## 2023-03-17 MED ORDER — ROCURONIUM BROMIDE 10 MG/ML (PF) SYRINGE
PREFILLED_SYRINGE | INTRAVENOUS | Status: AC
  Filled 2023-03-17: qty 10

## 2023-03-17 MED ORDER — SUGAMMADEX SODIUM 200 MG/2ML IV SOLN
INTRAVENOUS | Status: DC | PRN
  Administered 2023-03-17: 275 mg via INTRAVENOUS

## 2023-03-17 MED ORDER — SCOPOLAMINE 1 MG/3DAYS TD PT72: 1.0000 | MEDICATED_PATCH | TRANSDERMAL | Status: DC

## 2023-03-17 MED ORDER — SUGAMMADEX SODIUM 200 MG/2ML IV SOLN: INTRAVENOUS | Status: DC | PRN

## 2023-03-17 MED ORDER — CHLORHEXIDINE GLUCONATE CLOTH 2 % EX PADS: 6.0000 | MEDICATED_PAD | Freq: Once | CUTANEOUS | Status: DC

## 2023-03-17 MED ORDER — MIDAZOLAM HCL 5 MG/5ML IJ SOLN
INTRAMUSCULAR | Status: DC | PRN
  Administered 2023-03-17: 2 mg via INTRAVENOUS

## 2023-03-17 MED ORDER — FENTANYL CITRATE (PF) 100 MCG/2ML IJ SOLN
INTRAMUSCULAR | Status: AC
  Filled 2023-03-17: qty 2

## 2023-03-17 MED ORDER — OXYCODONE HCL 5 MG PO TABS: 5.0000 mg | ORAL_TABLET | Freq: Once | ORAL | Status: DC | PRN

## 2023-03-17 MED ORDER — CELECOXIB 200 MG PO CAPS
ORAL_CAPSULE | ORAL | Status: AC
  Filled 2023-03-17: qty 1

## 2023-03-17 MED ORDER — FENTANYL CITRATE (PF) 100 MCG/2ML IJ SOLN
INTRAMUSCULAR | Status: DC | PRN
  Administered 2023-03-17: 25 ug via INTRAVENOUS
  Administered 2023-03-17 (×2): 50 ug via INTRAVENOUS
  Administered 2023-03-17: 25 ug via INTRAVENOUS

## 2023-03-17 MED ORDER — LIDOCAINE 2% (20 MG/ML) 5 ML SYRINGE
INTRAMUSCULAR | Status: AC
  Filled 2023-03-17: qty 5

## 2023-03-17 MED ORDER — ONDANSETRON HCL 4 MG/2ML IJ SOLN
INTRAMUSCULAR | Status: AC
  Filled 2023-03-17: qty 2

## 2023-03-17 MED ORDER — 0.9 % SODIUM CHLORIDE (POUR BTL) OPTIME
TOPICAL | Status: DC | PRN
  Administered 2023-03-17: 200 mL

## 2023-03-17 MED ORDER — DEXAMETHASONE SODIUM PHOSPHATE 10 MG/ML IJ SOLN
INTRAMUSCULAR | Status: AC
Start: 2023-03-17 — End: ?
  Filled 2023-03-17: qty 1

## 2023-03-17 MED ORDER — CEFAZOLIN SODIUM 3 G IV SOLR
3.0000 g | INTRAVENOUS | Status: AC
  Administered 2023-03-17: 3 g via INTRAVENOUS

## 2023-03-17 MED ORDER — PHENYLEPHRINE HCL-NACL 20-0.9 MG/250ML-% IV SOLN
INTRAVENOUS | Status: AC
  Filled 2023-03-17: qty 250

## 2023-03-17 MED ORDER — CELECOXIB 200 MG PO CAPS
200.0000 mg | ORAL_CAPSULE | ORAL | Status: AC
  Administered 2023-03-17: 200 mg via ORAL

## 2023-03-17 MED ORDER — PHENYLEPHRINE HCL-NACL 20-0.9 MG/250ML-% IV SOLN
INTRAVENOUS | Status: DC | PRN
  Administered 2023-03-17: 25 ug/min via INTRAVENOUS

## 2023-03-17 MED ORDER — PROPOFOL 10 MG/ML IV BOLUS
INTRAVENOUS | Status: DC | PRN
  Administered 2023-03-17: 130 mg via INTRAVENOUS

## 2023-03-17 MED ORDER — GABAPENTIN 300 MG PO CAPS
300.0000 mg | ORAL_CAPSULE | ORAL | Status: AC
  Administered 2023-03-17: 300 mg via ORAL

## 2023-03-17 MED ORDER — ACETAMINOPHEN 500 MG PO TABS
1000.0000 mg | ORAL_TABLET | ORAL | Status: AC
  Administered 2023-03-17: 1000 mg via ORAL

## 2023-03-17 MED ORDER — ACETAMINOPHEN 160 MG/5ML PO SOLN: 325.0000 mg | ORAL | Status: DC | PRN

## 2023-03-17 MED ORDER — SODIUM CHLORIDE 0.9 % IV SOLN: INTRAVENOUS | Status: DC | PRN

## 2023-03-17 SURGICAL SUPPLY — 45 items
BINDER BREAST 3XL (GAUZE/BANDAGES/DRESSINGS) IMPLANT
BINDER BREAST XXLRG (GAUZE/BANDAGES/DRESSINGS) IMPLANT
BLADE SURG 10 STRL SS (BLADE) ×8 IMPLANT
BLADE SURG 15 STRL LF DISP TIS (BLADE) IMPLANT
BNDG GAUZE DERMACEA FLUFF 4 (GAUZE/BANDAGES/DRESSINGS) ×4 IMPLANT
CANISTER SUCT 1200ML W/VALVE (MISCELLANEOUS) ×2 IMPLANT
CHLORAPREP W/TINT 26 (MISCELLANEOUS) ×4 IMPLANT
COVER BACK TABLE 60X90IN (DRAPES) ×2 IMPLANT
COVER MAYO STAND STRL (DRAPES) ×2 IMPLANT
DERMABOND ADVANCED .7 DNX12 (GAUZE/BANDAGES/DRESSINGS) ×4 IMPLANT
DRAIN CHANNEL 19F RND (DRAIN) IMPLANT
DRAPE TOP ARMCOVERS (MISCELLANEOUS) ×2 IMPLANT
DRAPE U-SHAPE 76X120 STRL (DRAPES) ×2 IMPLANT
DRAPE UTILITY XL STRL (DRAPES) ×2 IMPLANT
ELECT COATED BLADE 2.86 ST (ELECTRODE) ×2 IMPLANT
ELECT REM PT RETURN 9FT ADLT (ELECTROSURGICAL) ×2 IMPLANT
ELECTRODE REM PT RTRN 9FT ADLT (ELECTROSURGICAL) ×2 IMPLANT
EVACUATOR SILICONE 100CC (DRAIN) ×2 IMPLANT
GAUZE PAD ABD 8X10 STRL (GAUZE/BANDAGES/DRESSINGS) ×4 IMPLANT
GLOVE BIO SURGEON STRL SZ 6 (GLOVE) ×4 IMPLANT
GLOVE BIO SURGEON STRL SZ 6.5 (GLOVE) IMPLANT
GLOVE BIOGEL PI IND STRL 6.5 (GLOVE) IMPLANT
GOWN STRL REUS W/ TWL LRG LVL3 (GOWN DISPOSABLE) ×4 IMPLANT
NDL HYPO 25X1 1.5 SAFETY (NEEDLE) ×2 IMPLANT
NEEDLE HYPO 25X1 1.5 SAFETY (NEEDLE) ×2 IMPLANT
NS IRRIG 1000ML POUR BTL (IV SOLUTION) ×2 IMPLANT
PACK BASIN DAY SURGERY FS (CUSTOM PROCEDURE TRAY) ×2 IMPLANT
PENCIL SMOKE EVACUATOR (MISCELLANEOUS) ×2 IMPLANT
PIN SAFETY STERILE (MISCELLANEOUS) ×2 IMPLANT
SHEET MEDIUM DRAPE 40X70 STRL (DRAPES) ×2 IMPLANT
SLEEVE SCD COMPRESS KNEE MED (STOCKING) ×2 IMPLANT
SPONGE T-LAP 18X18 ~~LOC~~+RFID (SPONGE) ×6 IMPLANT
STAPLER SKIN PROX WIDE 3.9 (STAPLE) ×2 IMPLANT
SUT ETHILON 2 0 FS 18 (SUTURE) ×2 IMPLANT
SUT MNCRL AB 4-0 PS2 18 (SUTURE) ×2 IMPLANT
SUT PDS AB 2-0 CT2 27 (SUTURE) IMPLANT
SUT PROLENE 2 0 CT2 30 (SUTURE) IMPLANT
SUT VIC AB 3-0 PS1 18XBRD (SUTURE) IMPLANT
SUT VIC AB 4-0 PS2 18 (SUTURE) ×2 IMPLANT
SYR BULB IRRIG 60ML STRL (SYRINGE) ×2 IMPLANT
SYR CONTROL 10ML LL (SYRINGE) ×2 IMPLANT
TOWEL GREEN STERILE FF (TOWEL DISPOSABLE) ×4 IMPLANT
TUBE CONNECTING 20X1/4 (TUBING) ×2 IMPLANT
UNDERPAD 30X36 HEAVY ABSORB (UNDERPADS AND DIAPERS) ×4 IMPLANT
YANKAUER SUCT BULB TIP NO VENT (SUCTIONS) ×2 IMPLANT

## 2023-03-17 NOTE — Op Note (Signed)
Operative Note   DATE OF OPERATION: 1.17.2025  LOCATION: [facility]-outpatient  SURGICAL DIVISION: Plastic Surgery  PREOPERATIVE DIAGNOSES:  1. Right breast cancer UOQ ER+   POSTOPERATIVE DIAGNOSES:  same  PROCEDURE:  Right oncoplastic breast reduction, left breast reduction  SURGEON: Glenna Fellows MD MBA  ASSISTANT: none  ANESTHESIA:  General.   EBL: 150 ml  COMPLICATIONS: None immediate.   INDICATIONS FOR PROCEDURE:  The patient, Kerry Phillips, is a 68 y.o. female born on [birth year], is here for staged breast reconstruction following right lumpectomy.   FINDINGS: Right reduction total 963 g, left reduction 1329 g  DESCRIPTION OF PROCEDURE:  The patient was marked standing in the preoperative area to mark sternal notch, chest midline, anterior axillary lines, inframammary folds. The location of new nipple areolar complex was marked at level of on inframammary fold on anterior surface breast by palpation. This was marked symmetric over bilateral breasts. With aid of Wise pattern marker, location of new nipple areolar complex and vertical limbs (8 cm) were marked by displacement of breasts along meridian. The patient was taken to the operating room. SCDs were placed and IV antibiotics were given. The patient's operative site was prepped and draped in a sterile fashion. A time out was performed and all information was confirmed to be correct.     I began on left breast. Over left breast, superior medial pedicle marked and nipple areolar complex incised with 45 mm diameter marker. Pedicle deepithlialized and developed to chest wall. Breast tissue resected over lower pole. Medial and lateral flaps developed. Additional superior and lateral breast tissue excised. Breast tailor tacked closed.    I then directed attention to right breast where superior medial pedicle designed. NAC incised with 45 mm diameter marker. The pedicle was deepithelialized. Pedicle developed to chest  wall. Breast tissue resected over lower pole. This included the anterior margin of lumpectomy cavity and was sent as a separate specimen. Medial and lateral flaps developed. Additional superior and lateral breast tissue excised. Breast tailor tacked closed. Patient brought to upright sitting position and assessed for symmetry. Patient returned to supine position. Breast cavities irrigated and hemostasis obtained. Local anesthetic infiltrated throughout each breast. 19 Fr JP placed in each breast and secured with 2-0 nylon. Closure completed bilateral with 3-0 vicryl to approximate dermis along inframammary fold and vertical limb. Additional 2-0 PDS interrupted sutures placed at bilateral T junctions. NAC inset with 3-0 vicryl in dermis. Skin closure completed with 4-0 monocryl subcuticular throughout. Tissue adhesive applied. Dry dressing and breast binder applied.  The patient was allowed to wake from anesthesia, extubated and taken to the recovery room in satisfactory condition.   SPECIMENS: left breast reduction, right breast reduction with anterior lumpectomy cavity, right breast reduction  DRAINS: 19 Fr JP in right and left breast  Glenna Fellows, MD [facility] Plastic & Reconstructive Surgery  Office/ physician access line after hours [phone]

## 2023-03-17 NOTE — Anesthesia Postprocedure Evaluation (Signed)
Anesthesia Post Note  Patient: Kerry Phillips  Procedure(s) Performed: RIGHT ONCOPLASTIC BREAST RECONSTRUCTION (Right: Breast) LEFT MAMMARY REDUCTION  (BREAST) (Left: Breast)     Patient location during evaluation: PACU Anesthesia Type: General Level of consciousness: awake and alert Pain management: pain level controlled Vital Signs Assessment: post-procedure vital signs reviewed and stable Respiratory status: spontaneous breathing, nonlabored ventilation, respiratory function stable and patient connected to nasal cannula oxygen Cardiovascular status: blood pressure returned to baseline and stable Postop Assessment: no apparent nausea or vomiting Anesthetic complications: no  No notable events documented.  Last Vitals:  Vitals:   03/17/23 1500 03/17/23 1517  BP: (!) 156/87 (!) 169/96  Pulse: 91 (!) 102  Resp: (!) 25 16  Temp:  36.4 C  SpO2: 94% 94%    Last Pain:  Vitals:   03/17/23 1517  TempSrc: Temporal  PainSc: 2                  Shelton Silvas

## 2023-03-17 NOTE — Anesthesia Preprocedure Evaluation (Addendum)
Anesthesia Evaluation  Patient identified by MRN, date of birth, ID band Patient awake    Reviewed: Allergy & Precautions, NPO status , Patient's Chart, lab work & pertinent test results  Airway Mallampati: II  TM Distance: >3 FB Neck ROM: Full    Dental  (+) Chipped,    Pulmonary asthma    breath sounds clear to auscultation       Cardiovascular hypertension, Pt. on medications  Rhythm:Regular Rate:Normal     Neuro/Psych negative neurological ROS  negative psych ROS   GI/Hepatic negative GI ROS, Neg liver ROS,,,  Endo/Other  Hypothyroidism    Renal/GU Renal disease     Musculoskeletal  (+) Arthritis ,    Abdominal   Peds  Hematology   Anesthesia Other Findings   Reproductive/Obstetrics                             Anesthesia Physical Anesthesia Plan  ASA: 3  Anesthesia Plan: General   Post-op Pain Management: Tylenol PO (pre-op)* and Toradol IV (intra-op)*   Induction: Intravenous  PONV Risk Score and Plan: 4 or greater and Ondansetron, Dexamethasone, Midazolam and Scopolamine patch - Pre-op  Airway Management Planned: Oral ETT  Additional Equipment: None  Intra-op Plan:   Post-operative Plan: Extubation in OR  Informed Consent: I have reviewed the patients History and Physical, chart, labs and discussed the procedure including the risks, benefits and alternatives for the proposed anesthesia with the patient or authorized representative who has indicated his/her understanding and acceptance.     Dental advisory given  Plan Discussed with: CRNA  Anesthesia Plan Comments:        Anesthesia Quick Evaluation

## 2023-03-17 NOTE — H&P (Signed)
  Subjective  Patient ID: Kerry Phillips is a 68 y.o. female.  HPI  Presents for oncoplastic breast reconstruction. Presented following screening MMG with right breast calcifications. Diagnostic MMG/US showed calcifications throughout the outer right breast spanning 15 cm. [place] axilla normal. Biopsies labeled right UOQ showed focal ADH and right UOQ anterior extent showed IDC with DCIS, ER/PR+, Her2 -.   She underwent lumpectomy with final pathology showed foci of IDC dincontinuously involving area 2 cm, scattered intermediate grade DCIS, resection margins clear, 0/7 SLN.   Oncotype pending  Sister, MA and cousin with breast ca. Genetics negative.  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 Lives with spouse.  Review of Systems  Objective  Physical Exam  Cardiovascular: Normal rate. Normal heart sounds  Pulmonary/Chest Effort normal. Clear to auscultation  Skin  Fitzpatrick 6   Breasts: no palpable masses, grade 3 ptosis bilateral SN to nipple R 39 L 41 BW R 29 L 28 cm Nipple to IMF R 19 L 19 cm  Assessment/Plan  Malignant neoplasm of upper-outer quadrant of right breast in female, estrogen receptor positive S/p right lumpectomy SLN  Plan oncoplastic reconstruction 7-10 d post lumpectomy to ensure pathologic clearance. Reviewed reduction with anchor type scars, drains, post operative visits and limitations, recovery. Diminished sensation nipple and breast skin, risk of nipple loss, wound healing problems, asymmetry. Discussed will have some contraction of breast volume and increased firmness with radiation, less ptosis with aging. This can result in asymmetries long term. Discussed changes with wt gain, loss, aging. Discussed lumpectomy alone can result in NAC displacement, distortion contour breast following lumpectomy and RT, asymmetry breast volume and NAC position. Reviewed purpose of this type reconstruction to prevent these. Reviewed  breast lift or trying to correct NAC displacement post RT more difficult. Counseled I cannot assure her cup size. Reviewed can defer surgery until after therapies complete. In this setting would wait at least 6 months from end RT for any surgery. Reviewed increased risks complications in setting RT. Reviewed any complications from oncoplastic reconstruction procedure may delay start adjuvant therapies.   Given her overall breast size, SN to nipple distance, counseled I feel she is higher risk for complications from reduction surgery including nipple necrosis and wound healing problems and quoted 25-50 % change of these.   Additional risks including hematoma, bleeding, seroma, infection, need for additional procedures, blood clots in legs or lungs, unacceptable cosmetic result reviewed.  Glenna Fellows, MD [facility] Plastic & Reconstructive Surgery  Office/ physician access line after hours [phone]

## 2023-03-17 NOTE — Anesthesia Procedure Notes (Signed)
Procedure Name: Intubation Date/Time: 03/17/2023 10:43 AM  Performed by: Thornell Mule, CRNAPre-anesthesia Checklist: Patient identified, Emergency Drugs available, Suction available and Patient being monitored Patient Re-evaluated:Patient Re-evaluated prior to induction Oxygen Delivery Method: Circle system utilized Preoxygenation: Pre-oxygenation with 100% oxygen Induction Type: IV induction Ventilation: Mask ventilation without difficulty Laryngoscope Size: Miller and 3 Grade View: Grade I Tube type: Oral Tube size: 7.0 mm Number of attempts: 1 Airway Equipment and Method: Stylet and Oral airway Placement Confirmation: ETT inserted through vocal cords under direct vision, positive ETCO2 and breath sounds checked- equal and bilateral Secured at: 21 cm Tube secured with: Tape Dental Injury: Teeth and Oropharynx as per pre-operative assessment

## 2023-03-17 NOTE — Discharge Instructions (Addendum)
  Post Anesthesia Home Care Instructions  Activity: Get plenty of rest for the remainder of the day. A responsible individual must stay with you for 24 hours following the procedure.  For the next 24 hours, DO NOT: -Drive a car -Advertising copywriter -Drink alcoholic beverages -Take any medication unless instructed by your physician -Make any legal decisions or sign important papers.  Meals: Start with liquid foods such as gelatin or soup. Progress to regular foods as tolerated. Avoid greasy, spicy, heavy foods. If nausea and/or vomiting occur, drink only clear liquids until the nausea and/or vomiting subsides. Call your physician if vomiting continues.  Special Instructions/Symptoms: Your throat may feel dry or sore from the anesthesia or the breathing tube placed in your throat during surgery. If this causes discomfort, gargle with warm salt water. The discomfort should disappear within 24 hours.  If you had a scopolamine patch placed behind your ear for the management of post- operative nausea and/or vomiting:  1. The medication in the patch is effective for 72 hours, after which it should be removed.  Wrap patch in a tissue and discard in the trash. Wash hands thoroughly with soap and water. 2. You may remove the patch earlier than 72 hours if you experience unpleasant side effects which may include dry mouth, dizziness or visual disturbances. 3. Avoid touching the patch. Wash your hands with soap and water after contact with the patch.    Tylenol can be taken after 4 pm if needed

## 2023-03-17 NOTE — Transfer of Care (Signed)
Immediate Anesthesia Transfer of Care Note  Patient: Kerry Phillips  Procedure(s) Performed: RIGHT ONCOPLASTIC BREAST RECONSTRUCTION (Right: Breast) LEFT MAMMARY REDUCTION  (BREAST) (Left: Breast)  Patient Location: PACU  Anesthesia Type:General  Level of Consciousness: drowsy, patient cooperative, and responds to stimulation  Airway & Oxygen Therapy: Patient Spontanous Breathing and Patient connected to face mask oxygen  Post-op Assessment: Report given to RN and Post -op Vital signs reviewed and stable  Post vital signs: Reviewed and stable  Last Vitals:  Vitals Value Taken Time  BP 152/104 03/17/23 1430  Temp    Pulse 98 03/17/23 1430  Resp    SpO2 100 % 03/17/23 1430  Vitals shown include unfiled device data.  Last Pain:  Vitals:   03/17/23 1006  TempSrc: Oral  PainSc: 0-No pain      Patients Stated Pain Goal: 5 (03/17/23 1006)  Complications: No notable events documented.

## 2023-03-18 ENCOUNTER — Encounter (HOSPITAL_BASED_OUTPATIENT_CLINIC_OR_DEPARTMENT_OTHER): Payer: Self-pay | Admitting: Plastic Surgery

## 2023-03-20 LAB — SURGICAL PATHOLOGY

## 2023-03-28 ENCOUNTER — Encounter (HOSPITAL_COMMUNITY): Payer: Self-pay

## 2023-03-30 ENCOUNTER — Encounter: Payer: Self-pay | Admitting: *Deleted

## 2023-03-30 ENCOUNTER — Telehealth: Payer: Self-pay | Admitting: *Deleted

## 2023-03-30 DIAGNOSIS — Z170 Estrogen receptor positive status [ER+]: Secondary | ICD-10-CM

## 2023-03-30 NOTE — Telephone Encounter (Signed)
Received notification from pathology that there is no more tissue to send for oncotype testing. Physician team notified.

## 2023-04-04 ENCOUNTER — Inpatient Hospital Stay: Payer: Medicare Other | Attending: Hematology and Oncology | Admitting: Hematology and Oncology

## 2023-04-04 DIAGNOSIS — Z1721 Progesterone receptor positive status: Secondary | ICD-10-CM | POA: Diagnosis not present

## 2023-04-04 DIAGNOSIS — Z170 Estrogen receptor positive status [ER+]: Secondary | ICD-10-CM | POA: Diagnosis not present

## 2023-04-04 DIAGNOSIS — Z1732 Human epidermal growth factor receptor 2 negative status: Secondary | ICD-10-CM | POA: Diagnosis not present

## 2023-04-04 DIAGNOSIS — Z803 Family history of malignant neoplasm of breast: Secondary | ICD-10-CM | POA: Insufficient documentation

## 2023-04-04 DIAGNOSIS — C50411 Malignant neoplasm of upper-outer quadrant of right female breast: Secondary | ICD-10-CM

## 2023-04-04 NOTE — Assessment & Plan Note (Addendum)
 This is a very pleasant 68 year old postmenopausal female patient with newly diagnosed right breast invasive ductal carcinoma, grade 1, DCIS low to intermediate grade, ER/PR positive HER2 1+ by IHC Ki-67 of 2% presented to the breast [facility] for additional recommendations.  Given small tumor, strong ER/PR positivity and low proliferation index, she proceeded with upfront surgery.  Breast Cancer Oncotype testing inconclusive due to discontinuous tumor. Given the grade 1, strongly estrogen positive, progesterone positive, and low growth rate, decision made to proceed without chemotherapy and second oncotype. - Proceed with radiation therapy as planned. Sent an inbasket message to Dr Izell and Dr Vanderbilt. - Start antiestrogen therapy post-radiation.  Post-Mastectomy Healing Small opening noted at surgical site, but healing as expected. - Continue current wound care regimen.  General Health Maintenance / Followup Plans - Follow-up appointment with radiation oncologist on 04/11/2023. - Follow-up appointment with oncologist end of March 2025 to initiate antiestrogen therapy.

## 2023-04-04 NOTE — Progress Notes (Signed)
  Cancer Center CONSULT NOTE  Patient Care Team: Valerio Melanie DASEN, NP as PCP - General (Nurse Practitioner) Glean Stephane BROCKS, RN as Oncology Nurse Navigator Tyree Nanetta SAILOR, RN as Oncology Nurse Navigator Vanderbilt Ned, MD as Consulting Physician (General Surgery) Loretha Ash, MD as Consulting Physician (Hematology and Oncology) Izell Domino, MD as Attending Physician (Radiation Oncology)  CHIEF COMPLAINTS/PURPOSE OF CONSULTATION:  Newly diagnosed breast cancer  HISTORY OF PRESENTING ILLNESS:  Kerry Phillips 68 y.o. female is here because of recent diagnosis of right breast IDC  I reviewed her records extensively and collaborated the history with the patient.  SUMMARY OF ONCOLOGIC HISTORY: Oncology History  Malignant neoplasm of upper-outer quadrant of right breast in female, estrogen receptor positive (HCC)  12/14/2022 Mammogram   In the right breast, calcifications warranting further evaluation with magnified views.  In the left breast no finding suspicious for malignancy.  Ultrasound of the right axilla is performed showing normal lymph nodes.   01/06/2023 Pathology Results   Right breast needle core biopsy upper outer quadrant showed focal atypical ductal hyperplasia, fibroadenomatoid change with calcifications. Right breast needle core biopsy upper outer quadrant anterior aspect showed invasive ductal carcinoma, overall grade 1, DCIS low to intermediate nuclear grade.  Invasive cancer showed ER 95% positive strong staining, PR 20% positive strong staining, Ki-67 of 2% and HER2 1+   01/16/2023 Initial Diagnosis   Malignant neoplasm of upper-outer quadrant of right breast in female, estrogen receptor positive (HCC)   01/18/2023 Cancer Staging   Staging form: Breast, AJCC 8th Edition - Clinical stage from 01/18/2023: Stage IA (cT1a, cN0, cM0, G1, ER+, PR+, HER2-) - Signed by Loretha Ash, MD on 01/18/2023 Stage prefix: Initial diagnosis Histologic grading  system: 3 grade system Laterality: Right Staged by: Pathologist and managing physician Stage used in treatment planning: Yes National guidelines used in treatment planning: Yes Type of national guideline used in treatment planning: NCCN    Genetic Testing   [facility] CancerNext-Expanded Panel+RNA was Negative. 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 Report date is 02/16/2023.  The CancerNext-Expanded gene panel offered by [facility] and includes sequencing, rearrangement, and RNA analysis for the following 76 genes: AIP, ALK, APC, ATM, AXIN2, BAP1, BARD1, BMPR1A, BRCA1, BRCA2, BRIP1, CDC73, CDH1, CDK4, CDKN1B, CDKN2A, CEBPA, CHEK2, CTNNA1, DDX41, DICER1, ETV6, FH, FLCN, GATA2, LZTR1, MAX, MBD4, MEN1, MET, MLH1, MSH2, MSH3, MSH6, MUTYH, NF1, NF2, NTHL1, PALB2, PHOX2B, PMS2, POT1, PRKAR1A, PTCH1, PTEN, RAD51C, RAD51D, RB1, RET, RUNX1, SDHA, SDHAF2, SDHB, SDHC, SDHD, SMAD4, SMARCA4, SMARCB1, SMARCE1, STK11, SUFU, TMEM127, TP53, TSC1, TSC2, VHL, and WT1 (sequencing and deletion/duplication); EGFR, HOXB13, KIT, MITF, PDGFRA, POLD1, and POLE (sequencing only); EPCAM and GREM1 (deletion/duplication only).      Discussed the use of AI scribe software for clinical note transcription with the patient, who gave verbal consent to proceed.  History of Present Illness    Kerry Phillips is a 68 year old female with breast cancer who presents for follow-up after surgery and discussion of further treatment options.  She underwent a mammoplasty on January 17th as part of her breast cancer treatment. The oncotype testing was inconclusive due to insufficient tumor sample, as the tumor was discontinuous. Her breast cancer is characterized as grade one, strongly estrogen and progesterone positive, with a low growth rate of 2%.  She is healing well post-surgery, wearing a supportive bra, and not requiring significant pain medication. There is a small opening in the surgical  site,  which is expected to heal over time.  MEDICAL HISTORY:  Past Medical History:  Diagnosis Date   Arthritis    Breast cancer (HCC)    right   Dysphagia 01/17/2017   History of blood transfusion    with childbirth   Hypertension    Hypothyroidism    surgical    SURGICAL HISTORY: Past Surgical History:  Procedure Laterality Date   ABDOMINAL HYSTERECTOMY     ARTHROSCOPIC HAGLUNDS REPAIR Right 01/04/2019   Procedure: ENDOSCOPIC RESECTION HAGLUNDS DEFORMITY;  Surgeon: Harden Jerona GAILS, MD;  Location: [facility] OR;  Service: Orthopedics;  Laterality: Right;   BREAST BIOPSY Right 01/06/2023   MM RT BREAST BX W LOC DEV 1ST LESION IMAGE BX SPEC STEREO GUIDE 01/06/2023 [facility] MAMMOGRAPHY   BREAST BIOPSY Right 01/06/2023   MM RT BREAST BX W LOC DEV EA AD LESION IMG BX SPEC STEREO GUIDE 01/06/2023 [facility]   BREAST BIOPSY  03/08/2023   MM RT RADIOACTIVE SEED LOC MAMMO GUIDE 03/08/2023 [facility]   BREAST BIOPSY  03/08/2023   MM RT RADIOACTIVE SEED EA ADD LESION LOC MAMMO GUIDE 03/08/2023 [facility]   BREAST LUMPECTOMY WITH RADIOACTIVE SEED AND SENTINEL LYMPH NODE BIOPSY Right 03/09/2023   Procedure: RIGHT BREAST SEED LOCALIZED LUMPECTOMY BRACKETED WITH RIGHT SENTINEL LYMPH NODE MAPPING;  Surgeon: Vanderbilt Ned, MD;  Location: [facility] OR;  Service: General;  Laterality: Right;   BREAST RECONSTRUCTION Right 03/17/2023   Procedure: RIGHT ONCOPLASTIC BREAST RECONSTRUCTION;  Surgeon: Arelia Filippo, MD;  Location: [facility];  Service: Plastics;  Laterality: Right;   BREAST REDUCTION SURGERY Left 03/17/2023   Procedure: LEFT MAMMARY REDUCTION  (BREAST);  Surgeon: Arelia Filippo, MD;  Location:  SURGERY CENTER;  Service: Plastics;  Laterality: Left;   COLONOSCOPY     THYROIDECTOMY     90% removed    SOCIAL HISTORY: Social History   Socioeconomic History   Marital status: Married    Spouse name: Not on file   Number of children: 2   Years of education: Not  on file   Highest education level: Not on file  Occupational History   Not on file  Tobacco Use   Smoking status: Never   Smokeless tobacco: Never  Vaping Use   Vaping status: Never Used  Substance and Sexual Activity   Alcohol use: No   Drug use: Never   Sexual activity: Yes  Other Topics Concern   Not on file  Social History Narrative   Not on file   Social Drivers of Health   Financial Resource Strain: Low Risk  (04/11/2022)   Overall Financial Resource Strain (CARDIA)    Difficulty of Paying Living Expenses: Not hard at all  Food Insecurity: No Food Insecurity (01/18/2023)   Hunger Vital Sign    Worried About Running Out of Food in the Last Year: Never true    Ran Out of Food in the Last Year: Never true  Transportation Needs: No Transportation Needs (01/18/2023)   PRAPARE - Administrator, Civil Service (Medical): No    Lack of Transportation (Non-Medical): No  Physical Activity: Insufficiently Active (04/11/2022)   Exercise Vital Sign    Days of Exercise per Week: 3 days    Minutes of Exercise per Session: 30 min  Stress: No Stress Concern Present (04/11/2022)   [employer] of Occupational Health - Occupational Stress Questionnaire    Feeling of Stress : Not at all  Social Connections: Socially Integrated (04/11/2022)   Social Connection  and Isolation Panel [NHANES]    Frequency of Communication with Friends and Family: More than three times a week    Frequency of Social Gatherings with Friends and Family: More than three times a week    Attends Religious Services: More than 4 times per year    Active Member of [employer] or Organizations: Yes    Attends Engineer, Structural: More than 4 times per year    Marital Status: Married  Catering Manager Violence: Not At Risk (01/18/2023)   Humiliation, Afraid, Rape, and Kick questionnaire    Fear of Current or Ex-Partner: No    Emotionally Abused: No    Physically Abused: No    Sexually Abused: No     FAMILY HISTORY: Family History  Problem Relation Age of Onset   Hypertension Mother    Stroke Sister    Hypertension Sister    Lupus Sister    Breast cancer Sister        dx. <50   Breast cancer Maternal Aunt        dx. >50   Hypertension Daughter    Hypertension Son    Breast cancer Other        3 maternal first cousins once removed    ALLERGIES:  has no known allergies.  MEDICATIONS:  Current Outpatient Medications  Medication Sig Dispense Refill   acetaminophen  (TYLENOL ) 500 MG tablet Take 1,000 mg by mouth every 6 (six) hours as needed for moderate pain (pain score 4-6).     albuterol  (VENTOLIN  HFA) 108 (90 Base) MCG/ACT inhaler Inhale 2 puffs into the lungs every 6 (six) hours as needed. 18 g 2   calcitRIOL  (ROCALTROL ) 0.5 MCG capsule Take 1 capsule (0.5 mcg total) by mouth 2 (two) times daily. 180 capsule 4   cholecalciferol (VITAMIN D3) 25 MCG (1000 UNIT) tablet Take 1,000 Units by mouth daily.     Homeopathic Products (THERAWORX RELIEF EX) Apply 1 Application topically daily as needed (pain).     ibuprofen  (ADVIL ) 800 MG tablet Take 1 tablet (800 mg total) by mouth every 8 (eight) hours as needed. 30 tablet 0   Iron-Vitamins (GERITOL TONIC PO) Take 5 mLs by mouth daily.     levothyroxine  (SYNTHROID ) 100 MCG tablet TAKE 1 TABLET BY MOUTH EVERY DAY 90 tablet 1   Olmesartan -amLODIPine -HCTZ 40-5-12.5 MG TABS TAKE 1 TABLET BY MOUTH ONCE DAILY 90 tablet 4   OVER THE COUNTER MEDICATION Take 2 tablets by mouth daily. Caralluma Fimbriata     oxyCODONE  (OXY IR/ROXICODONE ) 5 MG immediate release tablet Take 1 tablet (5 mg total) by mouth every 6 (six) hours as needed for severe pain (pain score 7-10). 15 tablet 0   rosuvastatin  (CRESTOR ) 10 MG tablet Take 1 tablet (10 mg total) by mouth daily. 90 tablet 4   No current facility-administered medications for this visit.    REVIEW OF SYSTEMS:   Constitutional: Denies fevers, chills or abnormal night sweats Eyes: Denies  blurriness of vision, double vision or watery eyes Ears, nose, mouth, throat, and face: Denies mucositis or sore throat Respiratory: Denies cough, dyspnea or wheezes Cardiovascular: Denies palpitation, chest discomfort or lower extremity swelling Gastrointestinal:  Denies nausea, heartburn or change in bowel habits Skin: Denies abnormal skin rashes Lymphatics: Denies new lymphadenopathy or easy bruising Neurological:Denies numbness, tingling or new weaknesses Behavioral/Psych: Mood is stable, no new changes  Breast: Denies any palpable lumps or discharge All other systems were reviewed with the patient and are negative.  PHYSICAL EXAMINATION: ECOG  PERFORMANCE STATUS: 0 - Asymptomatic  Vitals:   04/04/23 1135  BP: (!) 145/74  Pulse: 98  Resp: 16  Temp: 97.6 F (36.4 C)  SpO2: 98%   Filed Weights   04/04/23 1135  Weight: 278 lb 3.2 oz (126.2 kg)    GENERAL:alert, no distress and comfortable S/p bilateral mammoplasty, overall healing as expected.  No concern for infection.  LABORATORY DATA:  I have reviewed the data as listed Lab Results  Component Value Date   WBC 8.6 03/03/2023   HGB 11.8 (L) 03/03/2023   HCT 35.8 (L) 03/03/2023   MCV 86.3 03/03/2023   PLT 323 03/03/2023   Lab Results  Component Value Date   NA 139 03/03/2023   K 3.9 03/03/2023   CL 101 03/03/2023   CO2 29 03/03/2023    RADIOGRAPHIC STUDIES: I have personally reviewed the radiological reports and agreed with the findings in the report.  ASSESSMENT AND PLAN:  Malignant neoplasm of upper-outer quadrant of right breast in female, estrogen receptor positive (HCC) This is a very pleasant 68 year old postmenopausal female patient with newly diagnosed right breast invasive ductal carcinoma, grade 1, DCIS low to intermediate grade, ER/PR positive HER2 1+ by IHC Ki-67 of 2% presented to the breast [facility] for additional recommendations.  Given small tumor, strong ER/PR positivity and low proliferation index,  she proceeded with upfront surgery.  Breast Cancer Oncotype testing inconclusive due to discontinuous tumor. Given the grade 1, strongly estrogen positive, progesterone positive, and low growth rate, decision made to proceed without chemotherapy and second oncotype. - Proceed with radiation therapy as planned. Sent an inbasket message to Dr Izell and Dr Vanderbilt. - Start antiestrogen therapy post-radiation.  Post-Mastectomy Healing Small opening noted at surgical site, but healing as expected. - Continue current wound care regimen.  General Health Maintenance / Followup Plans - Follow-up appointment with radiation oncologist on 04/11/2023. - Follow-up appointment with oncologist end of March 2025 to initiate antiestrogen therapy.    All questions were answered. The patient knows to call the clinic with any problems, questions or concerns.    Amber Stalls, MD 04/04/23

## 2023-04-05 ENCOUNTER — Encounter: Payer: Self-pay | Admitting: *Deleted

## 2023-04-06 NOTE — Progress Notes (Signed)
Location of Breast Cancer: Malignant neoplasm of upper-outer quadrant of right breast, estrogen receptor positive.  Histology per Pathology Report:    Receptor Status:  Invasive cancer showed ER 95% positive strong staining, PR 20% positive strong staining, Ki-67 of 2% and HER2 1+    Did patient present with symptoms (if so, please note symptoms) or was this found on screening mammography?:  Patient says she didn't have signs and symptoms. She discovered it during a mammogram  Past/Anticipated interventions by surgeon, if any: Dr. Luisa Hart 03/09/2023 Right breast bracketed seed localized lumpectomy with deep axillary sentinel lymph node mapping Breast conserving surgery with breast reduction.  Dr. Leta Baptist on 03/17/2023 Right oncoplastic breast reduction, left breast reduction  Past/Anticipated interventions by medical oncology, if any:  Dr. Al Pimple 04/04/2023 Proceed with radiation therapy as planned. Begin antiestrogen therapy post radiation.   Lymphedema issues, if any:  None  Pain issues, if any:  None  SAFETY ISSUES: Prior radiation? None Pacemaker/ICD? None Possible current pregnancy?N/A Is the patient on methotrexate? None  Current Complaints / other details:     None BP (!) 144/88 (BP Location: Left Arm, Patient Position: Sitting)   Pulse 97   Temp (!) 96.6 F (35.9 C) (Temporal)   Resp 18   Ht 5\' 4"  (1.626 m)   Wt 274 lb 8 oz (124.5 kg)   SpO2 99%   BMI 47.12 kg/m    Wt Readings from Last 3 Encounters:  04/11/23 274 lb 8 oz (124.5 kg)  04/04/23 278 lb 3.2 oz (126.2 kg)  03/17/23 283 lb 4.7 oz (128.5 kg)

## 2023-04-10 NOTE — Progress Notes (Signed)
 Radiation Oncology         [phone] ________________________________  Name: Kerry Phillips MRN: [id number]  Date: 04/11/2023  DOB: 02-27-1956  Breast Consultation  Outpatient  CC: Marjie Skiff, NP  Rachel Moulds, MD  Diagnosis:      ICD-10-CM   1. Malignant neoplasm of upper-outer quadrant of right breast in female, estrogen receptor positive (HCC)  C50.411    Z17.0        Cancer Staging  Malignant neoplasm of upper-outer quadrant of right breast in female, estrogen receptor positive (HCC) Staging form: Breast, AJCC 8th Edition - Clinical stage from 01/18/2023: Stage IA (cT1a, cN0, cM0, G1, ER+, PR+, HER2-) - Signed by Rachel Moulds, MD on 01/18/2023 Stage prefix: Initial diagnosis Histologic grading system: 3 grade system Laterality: Right Staged by: Pathologist and managing physician Stage used in treatment planning: Yes National guidelines used in treatment planning: Yes Type of national guideline used in treatment planning: NCCN - Pathologic stage from 04/11/2023: Stage IA (pT1c, pN0, cM0, G1, ER+, PR+, HER2-) - Signed by Erven Colla, PA-C on 04/11/2023 Stage prefix: Initial diagnosis Method of lymph node assessment: Clinical Multigene prognostic tests performed: Other Histologic grading system: 3 grade system   Stage IA (pT1c, N0, M0) Invasive Ductal Carcinoma of the right breast, ER/PR+, HER2-; s/p right lumpectomy and oncoplastic breast reconstruction  CHIEF COMPLAINT: Here to discuss management of right breast cancer  Narrative:  The patient returns today for follow-up. She was last seen in office on 01-18-23.     Since consultation date, she underwent genetic testing on 02-20-23 due to a personal and family history of cancer and concerns regarding a hereditary predisposition to cancer. Testing revealed no pathogenic mutations.   She later presented for a right breast seed localized lumpectomy bracketed with right sentinel lymph node mapping on 03-09-23  under the care of Dr. Harriette Bouillon. Surgical pathology of right breast revealed: grade 1 invasive ductal carcinoma discontinuously involving a fibrotic area of approximately 2 cm; scattered intermediate grade DCIS; all margins negative for invasive disease; margin status to in situ disease of 1 cm; all lymph nodes negative for carcinoma;  ER status: 95% positive with strong staining; PR status 20 % positive with strong staining, Her2 negative; Ki-67 of 2%. Oncotype testing was inconclusive due to insufficient tumor sample.  Subsequently, she presented for a right breast oncoplastic breast reconstruction and left breast mammary reduction on 03-17-23 under the care of Dr. Leta Baptist. Surgical pathology of right breast with lumpectomy cavity showed benign breast tissue with fat necrosis and fibroblastic reaction consistent with prior procedure changes and is negative for any malignancy. Left breast biopsy was negative for any malignancy. During post-op follow up with Dr. Leta Baptist on 03-22-23, she reported feeling well overall, had drains removed, and was advised on minimal exercise.   Patient with Dr. Al Pimple on 04-04-23, she reported healing well overall, with minimal use of pain medications. She did not complain of any new symptoms at that time. Given the grade 1, strongly estrogen positive, progesterone positive, and low growth rate, decision was made to proceed with radiation therapy without chemotherapy. Antiestrogen therapy will be initiated following radiation completion.   Symptomatically, the patient reports: drainage from her right breast incision that she noticed this morning along with erythema along her right breast that she first noticed two days ago. She has called Dr. Maude Leriche office and is scheduled to see her tomorrow. She denies any fever, chills, or breast pain. She denies any issues with  range of motion.         ALLERGIES:  has no known allergies.  Meds: Current Outpatient Medications   Medication Sig Dispense Refill   acetaminophen (TYLENOL) 500 MG tablet Take 1,000 mg by mouth every 6 (six) hours as needed for moderate pain (pain score 4-6).     albuterol (VENTOLIN HFA) 108 (90 Base) MCG/ACT inhaler Inhale 2 puffs into the lungs every 6 (six) hours as needed. 18 g 2   calcitRIOL (ROCALTROL) 0.5 MCG capsule Take 1 capsule (0.5 mcg total) by mouth 2 (two) times daily. 180 capsule 4   cholecalciferol (VITAMIN D3) 25 MCG (1000 UNIT) tablet Take 1,000 Units by mouth daily.     Iron-Vitamins (GERITOL TONIC PO) Take 5 mLs by mouth daily.     levothyroxine (SYNTHROID) 100 MCG tablet TAKE 1 TABLET BY MOUTH EVERY DAY 90 tablet 1   Olmesartan-amLODIPine-HCTZ 40-5-12.5 MG TABS TAKE 1 TABLET BY MOUTH ONCE DAILY 90 tablet 4   rosuvastatin (CRESTOR) 10 MG tablet Take 1 tablet (10 mg total) by mouth daily. 90 tablet 4   Homeopathic Products (THERAWORX RELIEF EX) Apply 1 Application topically daily as needed (pain). (Patient not taking: Reported on 04/11/2023)     ibuprofen (ADVIL) 800 MG tablet Take 1 tablet (800 mg total) by mouth every 8 (eight) hours as needed. (Patient not taking: Reported on 04/11/2023) 30 tablet 0   OVER THE COUNTER MEDICATION Take 2 tablets by mouth daily. Caralluma Fimbriata (Patient not taking: Reported on 04/11/2023)     oxyCODONE (OXY IR/ROXICODONE) 5 MG immediate release tablet Take 1 tablet (5 mg total) by mouth every 6 (six) hours as needed for severe pain (pain score 7-10). (Patient not taking: Reported on 04/11/2023) 15 tablet 0   No current facility-administered medications for this encounter.    Physical Findings:  height is 5\' 4"  (1.626 m) and weight is 274 lb 8 oz (124.5 kg). Her temporal temperature is 96.6 F (35.9 C) (abnormal). Her blood pressure is 144/88 (abnormal) and her pulse is 97. Her respiration is 18 and oxygen saturation is 99%. .     General: Alert and oriented, in no acute distress HEENT: Head is normocephalic. Extraocular movements are  intact. Oropharynx is clear. Neck: Neck is supple, no palpable cervical or supraclavicular lymphadenopathy. Heart: Regular in rate and rhythm with no murmurs, rubs, or gallops. Chest: Clear to auscultation bilaterally, with no rhonchi, wheezes, or rales. Abdomen: Soft, nontender, nondistended, with no rigidity or guarding. Extremities: No cyanosis or edema. Lymphatics: see Neck Exam Musculoskeletal: symmetric strength and muscle tone throughout. Neurologic: No obvious focalities. Speech is fluent.  Psychiatric: Judgment and insight are intact. Affect is appropriate.  Left breast: Gap filled with granulation tissue visible between wound sides along the inframammary incision.  Right breast: Open wound along the inframammary incision with mild drainage. Erythema along the lateral breast with associated warmth. All other incisions are well approximated. No palpable mass within the breast or axilla.   Lab Findings: Lab Results  Component Value Date   WBC 8.6 03/03/2023   HGB 11.8 (L) 03/03/2023   HCT 35.8 (L) 03/03/2023   MCV 86.3 03/03/2023   PLT 323 03/03/2023    Radiographic Findings: No results found.  Impression/Plan: Stage IA (pT1c, N0, M0) Invasive Ductal Carcinoma of the right breast, ER/PR+, HER2-; s/p right lumpectomy and oncoplastic breast reconstruction  We discussed adjuvant radiotherapy today.  Dr. Basilio Cairo recommends radiation to the right breast in order to reduce the risk of locoregional disease  recurrence. I reviewed the logistics, benefits, risks, and potential side effects of this treatment in detail. Risks may include but not necessary be limited to acute and late injury tissue in the radiation fields such as skin irritation (change in color/pigmentation, itching, dryness, pain, peeling). She may experience fatigue. We also discussed possible risk of long term cosmetic changes or scar tissue. There is also a smaller risk for lung toxicity, lymphedema, musculoskeletal  changes, rib fragility or induction of a second malignancy.  The patient asked good questions which I answered to her satisfaction. She is enthusiastic about proceeding with treatment. A consent form has been signed and placed in her chart.  Patient is exhibiting signs of delayed wound healing and possible infection. She is scheduled to see Dr. Leta Baptist tomorrow, 04/12/23. We will defer treatment of this to her. We will continue with the scheduled CT simulation later today and expect to begin treatment on 05/03/23, to allow for adequate time to heal. The patient will receive 50 Gy in 25 fractions to the right breast.  On date of service, in total, I spent 30 minutes on this encounter. Patient was seen in person.  _____________________________________    Bryan Lemma, PA-C  This document serves as a record of services personally performed by Bryan Lemma, PA-C. It was created on her behalf by Herbie Saxon, a trained medical scribe. The creation of this record is based on the scribe's personal observations and the provider's statements to them. This document has been checked and approved by the attending provider.

## 2023-04-11 ENCOUNTER — Ambulatory Visit
Admission: RE | Admit: 2023-04-11 | Discharge: 2023-04-11 | Disposition: A | Discharge status: Home / Self Care | Payer: Medicare Other | Source: Ambulatory Visit | Attending: Radiation Oncology | Admitting: Radiation Oncology

## 2023-04-11 VITALS — BP 144/88 | HR 97 | Temp 96.6°F | Resp 18 | Ht 64.0 in | Wt 274.5 lb

## 2023-04-11 DIAGNOSIS — Z1732 Human epidermal growth factor receptor 2 negative status: Secondary | ICD-10-CM | POA: Insufficient documentation

## 2023-04-11 DIAGNOSIS — Z1721 Progesterone receptor positive status: Secondary | ICD-10-CM | POA: Insufficient documentation

## 2023-04-11 DIAGNOSIS — Z170 Estrogen receptor positive status [ER+]: Secondary | ICD-10-CM | POA: Diagnosis not present

## 2023-04-11 DIAGNOSIS — C50411 Malignant neoplasm of upper-outer quadrant of right female breast: Secondary | ICD-10-CM | POA: Diagnosis present

## 2023-04-17 ENCOUNTER — Encounter: Payer: Self-pay | Admitting: *Deleted

## 2023-04-17 DIAGNOSIS — Z170 Estrogen receptor positive status [ER+]: Secondary | ICD-10-CM

## 2023-04-20 ENCOUNTER — Encounter: Payer: Self-pay | Admitting: Emergency Medicine

## 2023-04-24 ENCOUNTER — Telehealth: Payer: Self-pay | Admitting: *Deleted

## 2023-04-24 NOTE — Telephone Encounter (Signed)
 Pt called to this RN to ask if she could proceed back to "getting my nails done".  She states she gets "tips with overlay" and has a close relationship with her manicurist.  She also states she understands general tools would have to be sterilized for use as well as some of the supplies she will pay for that will only be used on her nails.  This RN informed her - per Med Onc above would be appropriate but need to inquire with Rad onc for their input for best outcome.  This note will be forwarded to Rad Onc provider and nurse for follow up.

## 2023-04-26 DIAGNOSIS — C50411 Malignant neoplasm of upper-outer quadrant of right female breast: Secondary | ICD-10-CM | POA: Diagnosis not present

## 2023-04-27 ENCOUNTER — Ambulatory Visit: Payer: Medicare Other

## 2023-04-27 VITALS — Ht 64.5 in | Wt 270.0 lb

## 2023-04-27 DIAGNOSIS — Z0000 Encounter for general adult medical examination without abnormal findings: Secondary | ICD-10-CM | POA: Diagnosis not present

## 2023-04-27 NOTE — Progress Notes (Signed)
 Subjective:   Kerry Phillips is a 68 y.o. who presents for a Medicare Wellness preventive visit.  Visit Complete: Virtual I connected with  Kerry Phillips on 04/27/23 by a audio enabled telemedicine application and verified that I am speaking with the correct person using two identifiers.  Patient Location: Home  Provider Location: Home Office  I discussed the limitations of evaluation and management by telemedicine. The patient expressed understanding and agreed to proceed.  Vital Signs: Because this visit was a virtual/telehealth visit, some criteria may be missing or patient reported. Any vitals not documented were not able to be obtained and vitals that have been documented are patient reported.  VideoDeclined- This patient declined Librarian, academic. Therefore the visit was completed with audio only.  AWV Questionnaire: No: Patient Medicare AWV questionnaire was not completed prior to this visit.  Cardiac Risk Factors include: advanced age (>11men, >76 women);hypertension;obesity (BMI >30kg/m2);dyslipidemia     Objective:    Today's Vitals   04/27/23 0802  Weight: 270 lb (122.5 kg)  Height: 5' 4.5" (1.638 m)   Body mass index is 45.63 kg/m.     04/27/2023    8:13 AM 04/11/2023   11:26 AM 03/17/2023   10:03 AM 03/03/2023    2:14 PM 01/18/2023   10:52 AM 04/11/2022    8:48 AM 02/15/2021    9:50 AM  Advanced Directives  Does Patient Have a Medical Advance Directive? Yes Yes Yes Yes No No Yes  Type of Estate agent of [place];Living will Living will Healthcare Power of [place];Living will Healthcare Power of [place];Living will   Healthcare Power of Attorney  Does patient want to make changes to medical advance directive? No - Patient declined No - Patient declined  No - Patient declined     Copy of Healthcare Power of Attorney in Chart? No - copy requested   No - copy requested   No - copy requested  Would patient like  information on creating a medical advance directive?     No - Patient declined No - Patient declined     Current Medications (verified) Outpatient Encounter Medications as of 04/27/2023  Medication Sig   acetaminophen (TYLENOL) 500 MG tablet Take 1,000 mg by mouth every 6 (six) hours as needed for moderate pain (pain score 4-6).   albuterol (VENTOLIN HFA) 108 (90 Base) MCG/ACT inhaler Inhale 2 puffs into the lungs every 6 (six) hours as needed.   calcitRIOL (ROCALTROL) 0.5 MCG capsule Take 1 capsule (0.5 mcg total) by mouth 2 (two) times daily.   cholecalciferol (VITAMIN D3) 25 MCG (1000 UNIT) tablet Take 1,000 Units by mouth daily.   Iron-Vitamins (GERITOL TONIC PO) Take 5 mLs by mouth daily.   levothyroxine (SYNTHROID) 100 MCG tablet TAKE 1 TABLET BY MOUTH EVERY DAY   Olmesartan-amLODIPine-HCTZ 40-5-12.5 MG TABS TAKE 1 TABLET BY MOUTH ONCE DAILY   rosuvastatin (CRESTOR) 10 MG tablet Take 1 tablet (10 mg total) by mouth daily.   Homeopathic Products (THERAWORX RELIEF EX) Apply 1 Application topically daily as needed (pain). (Patient not taking: Reported on 04/11/2023)   ibuprofen (ADVIL) 800 MG tablet Take 1 tablet (800 mg total) by mouth every 8 (eight) hours as needed. (Patient not taking: Reported on 04/27/2023)   OVER THE COUNTER MEDICATION Take 2 tablets by mouth daily. [place] Fimbriata (Patient not taking: Reported on 04/11/2023)   oxyCODONE (OXY IR/ROXICODONE) 5 MG immediate release tablet Take 1 tablet (5 mg total) by mouth every 6 (  six) hours as needed for severe pain (pain score 7-10). (Patient not taking: Reported on 04/27/2023)   No facility-administered encounter medications on file as of 04/27/2023.    Allergies (verified) Patient has no known allergies.   History: Past Medical History:  Diagnosis Date   Arthritis    Breast cancer (HCC)    right   Dysphagia 01/17/2017   History of blood transfusion    with childbirth   Hypertension    Hypothyroidism    surgical    Past Surgical History:  Procedure Laterality Date   ABDOMINAL HYSTERECTOMY     ARTHROSCOPIC HAGLUNDS REPAIR Right 01/04/2019   Procedure: ENDOSCOPIC RESECTION HAGLUNDS DEFORMITY;  Surgeon: Nadara Mustard, MD;  Location: [facility];  Service: Orthopedics;  Laterality: Right;   BREAST BIOPSY Right 01/06/2023   MM RT BREAST BX W LOC DEV 1ST LESION IMAGE BX SPEC STEREO GUIDE 01/06/2023 [facility]   BREAST BIOPSY Right 01/06/2023   MM RT BREAST BX W LOC DEV EA AD LESION IMG BX SPEC STEREO GUIDE 01/06/2023 [facility]   BREAST BIOPSY  03/08/2023   MM RT RADIOACTIVE SEED LOC MAMMO GUIDE 03/08/2023 [facility]   BREAST BIOPSY  03/08/2023   MM RT RADIOACTIVE SEED EA ADD LESION LOC MAMMO GUIDE 03/08/2023 [facility]   BREAST LUMPECTOMY WITH RADIOACTIVE SEED AND SENTINEL LYMPH NODE BIOPSY Right 03/09/2023   Procedure: RIGHT BREAST SEED LOCALIZED LUMPECTOMY BRACKETED WITH RIGHT SENTINEL LYMPH NODE MAPPING;  Surgeon: Harriette Bouillon, MD;  Location: [facility] OR;  Service: General;  Laterality: Right;   BREAST RECONSTRUCTION Right 03/17/2023   Procedure: RIGHT ONCOPLASTIC BREAST RECONSTRUCTION;  Surgeon: Glenna Fellows, MD;  Location: [facility];  Service: Plastics;  Laterality: Right;   BREAST REDUCTION SURGERY Left 03/17/2023   Procedure: LEFT MAMMARY REDUCTION  (BREAST);  Surgeon: Glenna Fellows, MD;  Location: [facility];  Service: Plastics;  Laterality: Left;   COLONOSCOPY     THYROIDECTOMY     90% removed   Family History  Problem Relation Age of Onset   Hypertension Mother    Lung cancer Father    Stroke Sister    Hypertension Sister    Lupus Sister    Breast cancer Sister        dx. <50   Hypertension Daughter    Hypertension Son    Breast cancer Maternal Aunt        dx. >50   Breast cancer Other        3 maternal first cousins once removed   Social History   Socioeconomic History   Marital status: Married    Spouse name: Greer Pickerel   Number of  children: 2   Years of education: Not on file   Highest education level: Not on file  Occupational History   Occupation: self employed  Tobacco Use   Smoking status: Never   Smokeless tobacco: Never  Vaping Use   Vaping status: Never Used  Substance and Sexual Activity   Alcohol use: No   Drug use: Never   Sexual activity: Yes  Other Topics Concern   Not on file  Social History Narrative   Still working-self employed   Social Drivers of Health   Financial Resource Strain: Low Risk  (04/27/2023)   Overall Financial Resource Strain (CARDIA)    Difficulty of Paying Living Expenses: Not hard at all  Food Insecurity: No Food Insecurity (04/27/2023)   Hunger Vital Sign    Worried About Running Out of Food in  the Last Year: Never true    Ran Out of Food in the Last Year: Never true  Transportation Needs: No Transportation Needs (04/27/2023)   PRAPARE - Administrator, Civil Service (Medical): No    Lack of Transportation (Non-Medical): No  Physical Activity: Sufficiently Active (04/27/2023)   Exercise Vital Sign    Days of Exercise per Week: 3 days    Minutes of Exercise per Session: 60 min  Stress: No Stress Concern Present (04/27/2023)   [employer] of Occupational Health - Occupational Stress Questionnaire    Feeling of Stress : Not at all  Social Connections: Socially Integrated (04/27/2023)   Social Connection and Isolation Panel [NHANES]    Frequency of Communication with Friends and Family: More than three times a week    Frequency of Social Gatherings with Friends and Family: More than three times a week    Attends Religious Services: More than 4 times per year    Active Member of [employer] or Organizations: Yes    Attends Engineer, structural: More than 4 times per year    Marital Status: Married    Tobacco Counseling Counseling given: Not Answered    Clinical Intake:  Pre-visit preparation completed: Yes  Pain : No/denies pain     BMI -  recorded: 45.63 Nutritional Status: BMI > 30  Obese Nutritional Risks: None Diabetes: No  How often do you need to have someone help you when you read instructions, pamphlets, or other written materials from your doctor or pharmacy?: 1 - Never  Interpreter Needed?: No  Information entered by :: Tora Kindred, CMA   Activities of Daily Living     04/27/2023    8:04 AM 03/17/2023   10:05 AM  In your present state of health, do you have any difficulty performing the following activities:  Hearing? 0 0  Vision? 0 0  Difficulty concentrating or making decisions? 0 0  Walking or climbing stairs? 0   Dressing or bathing? 0   Doing errands, shopping? 0   Preparing Food and eating ? N   Using the Toilet? N   In the past six months, have you accidently leaked urine? N   Do you have problems with loss of bowel control? N   Managing your Medications? N   Managing your Finances? N   Housekeeping or managing your Housekeeping? N     Patient Care Team: Marjie Skiff, NP as PCP - General (Nurse Practitioner) Pershing Proud, RN as Oncology Nurse Navigator Donnelly Angelica, RN as Oncology Nurse Navigator Harriette Bouillon, MD as Consulting Physician (General Surgery) Rachel Moulds, MD as Consulting Physician (Hematology and Oncology) Lonie Peak, MD as Attending Physician (Radiation Oncology)  Indicate any recent Medical Services you may have received from other than [facility] providers in the past year (date may be approximate).     Assessment:   This is a routine wellness examination for Kerry Phillips.  Hearing/Vision screen Hearing Screening - Comments:: Denies hearing loss Vision Screening - Comments:: Needs eye exam, gave list of local eye doctors   Goals Addressed             This Visit's Progress    Patient Stated       Work up to 5 days a week of exercise       Depression Screen     04/27/2023    8:11 AM 01/18/2023   11:34 AM 01/13/2023   10:16 AM 07/13/2022   10:12  AM 04/11/2022    8:46 AM 01/06/2022   11:10 AM 06/09/2021   10:43 AM  PHQ 2/9 Scores  PHQ - 2 Score 0 0 0 0 0 1 0  PHQ- 9 Score   0 0 0 4 3    Fall Risk     04/27/2023    8:15 AM 01/13/2023   10:15 AM 07/13/2022   10:12 AM 04/11/2022    8:48 AM 06/09/2021   10:40 AM  Fall Risk   Falls in the past year? 0 0 0 0 0  Number falls in past yr: 0 0 0 0 0  Injury with Fall? 0 0 0 0 0  Risk for fall due to : No Fall Risks No Fall Risks No Fall Risks No Fall Risks No Fall Risks  Follow up Falls prevention discussed;Falls evaluation completed Falls evaluation completed Education provided Falls prevention discussed;Falls evaluation completed Falls evaluation completed    MEDICARE RISK AT HOME:  Medicare Risk at Home Any stairs in or around the home?: Yes If so, are there any without handrails?: No Home free of loose throw rugs in walkways, pet beds, electrical cords, etc?: Yes Adequate lighting in your home to reduce risk of falls?: Yes Life alert?: No Use of a cane, walker or w/c?: No Grab bars in the bathroom?: No Shower chair or bench in shower?: No Elevated toilet seat or a handicapped toilet?: No  TIMED UP AND GO:  Was the test performed?  No  Cognitive Function: 6CIT completed        04/27/2023    8:17 AM 04/11/2022    8:53 AM  6CIT Screen  What Year? 0 points 0 points  What month? 0 points 0 points  What time? 0 points 0 points  Count back from 20 0 points 0 points  Months in reverse 0 points 0 points  Repeat phrase 4 points 0 points  Total Score 4 points 0 points    Immunizations Immunization History  Administered Date(s) Administered   Td 08/20/2008    Screening Tests Health Maintenance  Topic Date Due   COVID-19 Vaccine (1) Never done   Zoster Vaccines- Shingrix (1 of 2) Never done   Colonoscopy  02/17/2023   INFLUENZA VACCINE  05/29/2023 (Originally 09/29/2022)   Pneumonia Vaccine 76+ Years old (1 of 2 - PCV) 07/13/2023 (Originally 11/10/1961)   DTaP/Tdap/Td  (2 - Tdap) 01/08/2024 (Originally 08/21/2018)   Medicare Annual Wellness (AWV)  04/26/2024   MAMMOGRAM  12/30/2024   DEXA SCAN  05/30/2030   Hepatitis C Screening  Completed   HPV VACCINES  Aged Out    Health Maintenance  Health Maintenance Due  Topic Date Due   COVID-19 Vaccine (1) Never done   Zoster Vaccines- Shingrix (1 of 2) Never done   Colonoscopy  02/17/2023   Health Maintenance Items Addressed: See Nurse Notes  Additional Screening:  Vision Screening: Recommended annual ophthalmology exams for early detection of glaucoma and other disorders of the eye.  Dental Screening: Recommended annual dental exams for proper oral hygiene  Community Resource Referral / Chronic Care Management: CRR required this visit?  No   CCM required this visit?  No     Plan:     I have personally reviewed and noted the following in the patient's chart:   Medical and social history Use of alcohol, tobacco or illicit drugs  Current medications and supplements including opioid prescriptions. Patient is not currently taking opioid prescriptions. Functional ability and status Nutritional status  Physical activity Advanced directives List of other physicians Hospitalizations, surgeries, and ER visits in previous 12 months Vitals Screenings to include cognitive, depression, and falls Referrals and appointments  In addition, I have reviewed and discussed with patient certain preventive protocols, quality metrics, and best practice recommendations. A written personalized care plan for preventive services as well as general preventive health recommendations were provided to patient.     Tora Kindred, CMA   04/27/2023   After Visit Summary: (MyChart) Due to this being a telephonic visit, the after visit summary with patients personalized plan was offered to patient via MyChart   Notes:  6 CIT Score - 4 Needs eye exam, gave list of local doctors Undecided about getting a Tdap  vaccine Declined Covid, shingles flu and pneumonia vaccines Next colon scheduled for 08/18/23 Patient will start radiation therapy in March for breast cancer. Next MMG TBD

## 2023-04-27 NOTE — Patient Instructions (Addendum)
 Kerry Phillips , Thank you for taking time to come for your Medicare Wellness Visit. I appreciate your ongoing commitment to your health goals. Please review the following plan we discussed and let me know if I can assist you in the future.   Referrals/Orders/Follow-Ups/Clinician Recommendations: Get a routine eye exam every 2 years. I have included a list of doctors in your area.  This is a list of the screening recommended for you and due dates:  Health Maintenance  Topic Date Due   COVID-19 Vaccine (1) Never done   Zoster (Shingles) Vaccine (1 of 2) Never done   Colon Cancer Screening  02/17/2023   Flu Shot  05/29/2023*   Pneumonia Vaccine (1 of 2 - PCV) 07/13/2023*   DTaP/Tdap/Td vaccine (2 - Tdap) 01/08/2024*   Mammogram  12/31/2023   Medicare Annual Wellness Visit  04/26/2024   DEXA scan (bone density measurement)  05/30/2030   Hepatitis C Screening  Completed   HPV Vaccine  Aged Out  *Topic was postponed. The date shown is not the original due date.    Advanced directives: (Copy Requested) Please bring a copy of your health care power of attorney and living will to the office to be added to your chart at your convenience.  Next Medicare Annual Wellness Visit scheduled for next year: Yes, 05/09/24 @ 8:00am (video visit)  There are several Eye Doctors in your area. Here are a few that usually accept all insurance types:  [facility] [address] [place], [place] [address] Phone: [phone]  [facility] [address] [place], [place] [address] Phone: [phone]  [facility] [address] [place], [place] [address] Phone: [phone]  [facility] [address] [place], [place] [address] Phone: [phone]  [facility] [address] [place], [place] [address] Phone: [phone]  Please let us know if you require a referral for an eye exam appointment. Thank you!

## 2023-05-02 ENCOUNTER — Ambulatory Visit: Payer: Medicare Other | Attending: Surgery | Admitting: Physical Therapy

## 2023-05-02 ENCOUNTER — Encounter: Payer: Self-pay | Admitting: Physical Therapy

## 2023-05-02 DIAGNOSIS — C50411 Malignant neoplasm of upper-outer quadrant of right female breast: Secondary | ICD-10-CM | POA: Diagnosis present

## 2023-05-02 DIAGNOSIS — R293 Abnormal posture: Secondary | ICD-10-CM | POA: Diagnosis present

## 2023-05-02 DIAGNOSIS — Z170 Estrogen receptor positive status [ER+]: Secondary | ICD-10-CM | POA: Insufficient documentation

## 2023-05-02 NOTE — Therapy (Signed)
 OUTPATIENT PHYSICAL THERAPY BREAST CANCER POST OP FOLLOW UP   Patient Name: Kerry Phillips MRN: [id number] DOB:12-Aug-1955, 68 y.o., female Today's Date: 05/02/2023  END OF SESSION:  PT End of Session - 05/02/23 1601     Visit Number 2    Number of Visits 2    Date for PT Re-Evaluation 03/15/23    PT Start Time 1600    PT Stop Time 1625    PT Time Calculation (min) 25 min    Activity Tolerance Patient tolerated treatment well    Behavior During Therapy [facility] for tasks assessed/performed             Past Medical History:  Diagnosis Date   Arthritis    Breast cancer (HCC)    right   Dysphagia 01/17/2017   History of blood transfusion    with childbirth   Hypertension    Hypothyroidism    surgical   Past Surgical History:  Procedure Laterality Date   ABDOMINAL HYSTERECTOMY     ARTHROSCOPIC HAGLUNDS REPAIR Right 01/04/2019   Procedure: ENDOSCOPIC RESECTION HAGLUNDS DEFORMITY;  Surgeon: Nadara Mustard, MD;  Location: [facility] OR;  Service: Orthopedics;  Laterality: Right;   BREAST BIOPSY Right 01/06/2023   MM RT BREAST BX W LOC DEV 1ST LESION IMAGE BX SPEC STEREO GUIDE 01/06/2023 [facility]   BREAST BIOPSY Right 01/06/2023   MM RT BREAST BX W LOC DEV EA AD LESION IMG BX SPEC STEREO GUIDE 01/06/2023 [facility]   BREAST BIOPSY  03/08/2023   MM RT RADIOACTIVE SEED LOC MAMMO GUIDE 03/08/2023 [facility]   BREAST BIOPSY  03/08/2023   MM RT RADIOACTIVE SEED EA ADD LESION LOC MAMMO GUIDE 03/08/2023 [facility]   BREAST LUMPECTOMY WITH RADIOACTIVE SEED AND SENTINEL LYMPH NODE BIOPSY Right 03/09/2023   Procedure: RIGHT BREAST SEED LOCALIZED LUMPECTOMY BRACKETED WITH RIGHT SENTINEL LYMPH NODE MAPPING;  Surgeon: Harriette Bouillon, MD;  Location: [facility] OR;  Service: General;  Laterality: Right;   BREAST RECONSTRUCTION Right 03/17/2023   Procedure: RIGHT ONCOPLASTIC BREAST RECONSTRUCTION;  Surgeon: Glenna Fellows, MD;  Location: [facility];  Service: Plastics;   Laterality: Right;   BREAST REDUCTION SURGERY Left 03/17/2023   Procedure: LEFT MAMMARY REDUCTION  (BREAST);  Surgeon: Glenna Fellows, MD;  Location: [facility];  Service: Plastics;  Laterality: Left;   COLONOSCOPY     THYROIDECTOMY     90% removed   Patient Active Problem List   Diagnosis Date Noted   Genetic testing 02/17/2023   Gastroesophageal reflux disease 01/18/2023   History of colonic polyps 01/18/2023   Malignant neoplasm of upper-outer quadrant of right breast in female, estrogen receptor positive (HCC) 01/16/2023   No vaccination-pt refuse 01/08/2023   Exercise-induced asthma 01/06/2022   Stage 3a chronic kidney disease (CKD) (HCC) 07/25/2021   Elevated LDL cholesterol level 10/15/2020   Vitamin D deficiency 10/15/2020   Morbid obesity (HCC) 03/22/2018   Hypocalcemia 09/07/2017   BMI 45.0-49.9, adult (HCC) 04/05/2016   Arthritis of both knees 04/05/2016   Essential hypertension 12/22/2014   Hashimoto's thyroiditis 12/22/2014    PCP: Aura Dials, NP  REFERRING PROVIDER: Dr. Harriette Bouillon   REFERRING DIAG: Right breast cancer    THERAPY DIAG:  Abnormal posture  Malignant neoplasm of upper-outer quadrant of right breast in female, estrogen receptor positive (HCC)  Rationale for Evaluation and Treatment: Rehabilitation  ONSET DATE: 12/14/22  SUBJECTIVE:  SUBJECTIVE STATEMENT: I am doing well with no major issues.   PERTINENT HISTORY:  Patient was diagnosed on 12/14/2022 with right grade I invasive ductal carcinoma breast cancer. It measures 15 cm and is located in the upper outer quadrant. It is ER/PR positive and HER2 negative with a Ki67 of 2%. 03/09/23- R lumpectomy and SLNB 0/7, 03/17/23- R oncoplastic reconstruction and L reduction  PATIENT GOALS:   Reassess how my recovery is going related to arm function, pain, and swelling.  PAIN:  Are you having pain? No  PRECAUTIONS: Recent Surgery, right UE Lymphedema risk,   RED FLAGS: None   ACTIVITY LEVEL / LEISURE: doing her ROM exercises, walking daily 20 min 2x/day   OBJECTIVE:   PATIENT SURVEYS:  QUICK DASH:  Quick Dash - 05/02/23 0001     Open a tight or new jar Mild difficulty    Do heavy household chores (wash walls, wash floors) No difficulty    Carry a shopping bag or briefcase No difficulty    Wash your back No difficulty    Use a knife to cut food No difficulty    Recreational activities in which you take some force or impact through your arm, shoulder, or hand (golf, hammering, tennis) Mild difficulty    During the past week, to what extent has your arm, shoulder or hand problem interfered with your normal social activities with family, friends, neighbors, or groups? Not at all    During the past week, to what extent has your arm, shoulder or hand problem limited your work or other regular daily activities Slightly    Arm, shoulder, or hand pain. Mild    Tingling (pins and needles) in your arm, shoulder, or hand None    Difficulty Sleeping No difficulty    DASH Score 9.09 %              OBSERVATIONS: Well healed scars, some increased scar tissue near SLNB scar - educated pt in scar mobilization to begin after 6 weeks  POSTURE:  Forward head and rounded shoulders posture   UPPER EXTREMITY AROM/PROM:   A/PROM RIGHT   eval   RIGHT 05/02/23  Shoulder extension 50 80  Shoulder flexion 135 167  Shoulder abduction 142 167  Shoulder internal rotation 72 64  Shoulder external rotation 71 71                          (Blank rows = not tested)   A/PROM LEFT   eval LEFT 05/02/23  Shoulder extension 57 63  Shoulder flexion 130 161  Shoulder abduction 126 162  Shoulder internal rotation 63 61  Shoulder external rotation 65 68                          (Blank  rows = not tested)   CERVICAL AROM: All within normal limits:      Percent limited  Flexion WNL  Extension WNL  Right lateral flexion WNL  Left lateral flexion WNL  Right rotation 25% limited  Left rotation 25% limited                 UPPER EXTREMITY STRENGTH: WNL   LYMPHEDEMA ASSESSMENTS (in cm):    LANDMARK RIGHT   eval 05/02/23  10 cm proximal to olecranon process 36.9 35.5  Olecranon process 28.4 28.8  10 cm proximal to ulnar styloid process 25.8 24.3  Just proximal to ulnar styloid  process 18.2 18  Across hand at thumb web space 21.3 21.3  At base of 2nd digit 7.6 7.5  (Blank rows = not tested)   LANDMARK LEFT   eval LEFT 05/02/23  10 cm proximal to olecranon process 38.8 37.6  Olecranon process 28.9 28.5  10 cm proximal to ulnar styloid process 25.4 24.4  Just proximal to ulnar styloid process 18.5 17.6  Across hand at thumb web space 20.4 21.3  At base of 2nd digit 7.3 7.5  (Blank rows = not tested)  Surgery type/Date: 03/09/23- R lumpectomy and SLNB 0/7, 03/17/23- R oncoplastic reconstruction and L reduction Number of lymph nodes removed: 0/7 Current/past treatment (chemo, radiation, hormone therapy): will require radiation, will require an estrogen blocker Other symptoms:  Heaviness/tightness No Pain No Pitting edema No Infections No Decreased scar mobility: still healing Stemmer sign No  PATIENT EDUCATION:  Education details: continue ROM exercises throughout radiation. ABC class to learn about lymphedema risk reduction practices, posture, walking program Person educated: Patient Education method: Explanation and Handouts Education comprehension: verbalized understanding  HOME EXERCISE PROGRAM: Reviewed previously given post op HEP. Walking program  ASSESSMENT:  CLINICAL IMPRESSION: Pt returns to PT after undergoing a R lumpectomy and SLNB 0/7 on 03/09/23 followed by oncoplastic reconstruction on R and L reduction on 03/17/23. Pt's shoulder ROM has  improved greatly in direction of flexion and abduction from baseline. Pt reports she has been doing her ROM exercises daily and has started walking daily. She is not having any issues post op. She does not have any signs of lymphedema. She was educated about ABC class online to learn about lymphedema risk reduction practices. She will be discharged from skilled PT services at this time.   Pt will benefit from skilled therapeutic intervention to improve on the following deficits: Decreased knowledge of precautions, impaired UE functional use, pain, decreased ROM, postural dysfunction.   PT treatment/interventions: ADL/Self care home management, [phone]- PT Re-evaluation, 97110-Therapeutic exercises, 97530- Therapeutic activity, [id number]- Neuromuscular re-education, 97535- Self Care, and [id number]- Manual therapy   GOALS: Goals reviewed with patient? Yes  LONG TERM GOALS:  (STG=LTG)  GOALS Name Target Date  Goal status  1 Pt will demonstrate she has regained full shoulder ROM and function post operatively compared to baselines.  Baseline: 05/02/23 MET     PLAN:  PT FREQUENCY/DURATION: d/c this visit  PLAN FOR NEXT SESSION: d/c this visit and continue L dex screenings every 3 months for the first 2 years post op   [facility]  [address], [address]  [place] [address]  [phone]  After Breast Cancer Class Video It is recommended you view the ABC class video to be educated on lymphedema risk reduction. This video lasts for about 30 minutes. It can be viewed on our website here: [email or web address]  Scar massage You can begin gentle scar massage to you incision sites. Gently place one hand on the incision and move the skin (without sliding on the skin) in various directions. Do this for a few minutes and then you can gently massage either coconut oil or vitamin E cream into the scars.  Compression  garment You should continue wearing your compression bra until you feel like you no longer have swelling.  Home exercise Program Continue doing the exercises you were given until you feel like you can do them without feeling any tightness at the end.   Walking Program Studies show that 30 minutes of walking per day (fast enough to elevate  your heart rate) can significantly reduce the risk of a cancer recurrence. If you can't walk due to other medical reasons, we encourage you to find another activity you could do (like a stationary bike or water exercise).  Posture After breast cancer surgery, people frequently sit with rounded shoulders posture because it puts their incisions on slack and feels better. If you sit like this and scar tissue forms in that position, you can become very tight and have pain sitting or standing with good posture. Try to be aware of your posture and sit and stand up tall to heal properly.  Follow up PT: It is recommended you return every 3 months for the first 3 years following surgery to be assessed on the SOZO machine for an L-Dex score. This helps prevent clinically significant lymphedema in 95% of patients. These follow up screens are 10 minute appointments that you are not billed for.  Milagros Loll [place], PT 05/02/2023, 4:33 PM  PHYSICAL THERAPY DISCHARGE SUMMARY  Visits from Start of Care: 2  Current functional level related to goals / functional outcomes: All goals met   Remaining deficits: None   Education / Equipment: continue ROM exercises throughout radiation. ABC class to learn about lymphedema risk reduction practices, posture, walking program   Patient agrees to discharge. Patient goals were met. Patient is being discharged due to meeting the stated rehab goals.

## 2023-05-03 ENCOUNTER — Ambulatory Visit

## 2023-05-03 ENCOUNTER — Ambulatory Visit
Admission: RE | Admit: 2023-05-03 | Discharge: 2023-05-03 | Disposition: A | Discharge status: Home / Self Care | Payer: Medicare Other | Source: Ambulatory Visit | Attending: Radiation Oncology | Admitting: Radiation Oncology

## 2023-05-03 ENCOUNTER — Other Ambulatory Visit: Payer: Self-pay

## 2023-05-03 ENCOUNTER — Ambulatory Visit
Admission: RE | Admit: 2023-05-03 | Discharge: 2023-05-03 | Discharge status: Home / Self Care | Source: Ambulatory Visit | Attending: Radiation Oncology

## 2023-05-03 DIAGNOSIS — Z1721 Progesterone receptor positive status: Secondary | ICD-10-CM | POA: Insufficient documentation

## 2023-05-03 DIAGNOSIS — Z170 Estrogen receptor positive status [ER+]: Secondary | ICD-10-CM | POA: Insufficient documentation

## 2023-05-03 DIAGNOSIS — C50411 Malignant neoplasm of upper-outer quadrant of right female breast: Secondary | ICD-10-CM | POA: Diagnosis present

## 2023-05-03 DIAGNOSIS — Z1732 Human epidermal growth factor receptor 2 negative status: Secondary | ICD-10-CM | POA: Insufficient documentation

## 2023-05-03 LAB — RAD ONC ARIA SESSION SUMMARY
Course Elapsed Days: 0
Plan Fractions Treated to Date: 1
Plan Prescribed Dose Per Fraction: 2 Gy
Plan Total Fractions Prescribed: 25
Plan Total Prescribed Dose: 50 Gy
Reference Point Dosage Given to Date: 2 Gy
Reference Point Session Dosage Given: 2 Gy
Session Number: 1

## 2023-05-04 ENCOUNTER — Other Ambulatory Visit: Payer: Self-pay

## 2023-05-04 ENCOUNTER — Ambulatory Visit
Admission: RE | Admit: 2023-05-04 | Discharge: 2023-05-04 | Disposition: A | Discharge status: Home / Self Care | Payer: Medicare Other | Source: Ambulatory Visit | Attending: Radiation Oncology | Admitting: Radiation Oncology

## 2023-05-04 DIAGNOSIS — C50411 Malignant neoplasm of upper-outer quadrant of right female breast: Secondary | ICD-10-CM | POA: Diagnosis not present

## 2023-05-04 LAB — RAD ONC ARIA SESSION SUMMARY
Course Elapsed Days: 1
Plan Fractions Treated to Date: 2
Plan Prescribed Dose Per Fraction: 2 Gy
Plan Total Fractions Prescribed: 25
Plan Total Prescribed Dose: 50 Gy
Reference Point Dosage Given to Date: 4 Gy
Reference Point Session Dosage Given: 2 Gy
Session Number: 2

## 2023-05-05 ENCOUNTER — Other Ambulatory Visit: Payer: Self-pay

## 2023-05-05 ENCOUNTER — Ambulatory Visit
Admission: RE | Admit: 2023-05-05 | Discharge: 2023-05-05 | Disposition: A | Discharge status: Home / Self Care | Payer: Medicare Other | Source: Ambulatory Visit | Attending: Radiation Oncology | Admitting: Radiation Oncology

## 2023-05-05 DIAGNOSIS — C50411 Malignant neoplasm of upper-outer quadrant of right female breast: Secondary | ICD-10-CM | POA: Diagnosis not present

## 2023-05-05 LAB — RAD ONC ARIA SESSION SUMMARY
Course Elapsed Days: 2
Plan Fractions Treated to Date: 3
Plan Prescribed Dose Per Fraction: 2 Gy
Plan Total Fractions Prescribed: 25
Plan Total Prescribed Dose: 50 Gy
Reference Point Dosage Given to Date: 6 Gy
Reference Point Session Dosage Given: 2 Gy
Session Number: 3

## 2023-05-08 ENCOUNTER — Other Ambulatory Visit: Payer: Self-pay

## 2023-05-08 ENCOUNTER — Ambulatory Visit
Admission: RE | Admit: 2023-05-08 | Discharge: 2023-05-08 | Disposition: A | Discharge status: Home / Self Care | Payer: Medicare Other | Source: Ambulatory Visit | Attending: Radiation Oncology | Admitting: Radiation Oncology

## 2023-05-08 DIAGNOSIS — C50411 Malignant neoplasm of upper-outer quadrant of right female breast: Secondary | ICD-10-CM | POA: Diagnosis not present

## 2023-05-08 LAB — RAD ONC ARIA SESSION SUMMARY
Course Elapsed Days: 5
Plan Fractions Treated to Date: 4
Plan Prescribed Dose Per Fraction: 2 Gy
Plan Total Fractions Prescribed: 25
Plan Total Prescribed Dose: 50 Gy
Reference Point Dosage Given to Date: 8 Gy
Reference Point Session Dosage Given: 2 Gy
Session Number: 4

## 2023-05-09 ENCOUNTER — Other Ambulatory Visit: Payer: Self-pay

## 2023-05-09 ENCOUNTER — Ambulatory Visit
Admission: RE | Admit: 2023-05-09 | Discharge: 2023-05-09 | Disposition: A | Discharge status: Home / Self Care | Payer: Medicare Other | Source: Ambulatory Visit | Attending: Radiation Oncology | Admitting: Radiation Oncology

## 2023-05-09 DIAGNOSIS — C50411 Malignant neoplasm of upper-outer quadrant of right female breast: Secondary | ICD-10-CM | POA: Diagnosis not present

## 2023-05-09 LAB — RAD ONC ARIA SESSION SUMMARY
Course Elapsed Days: 6
Plan Fractions Treated to Date: 5
Plan Prescribed Dose Per Fraction: 2 Gy
Plan Total Fractions Prescribed: 25
Plan Total Prescribed Dose: 50 Gy
Reference Point Dosage Given to Date: 10 Gy
Reference Point Session Dosage Given: 2 Gy
Session Number: 5

## 2023-05-10 ENCOUNTER — Ambulatory Visit
Admission: RE | Admit: 2023-05-10 | Discharge: 2023-05-10 | Disposition: A | Discharge status: Home / Self Care | Payer: Medicare Other | Source: Ambulatory Visit | Attending: Radiation Oncology | Admitting: Radiation Oncology

## 2023-05-10 ENCOUNTER — Other Ambulatory Visit: Payer: Self-pay

## 2023-05-10 DIAGNOSIS — C50411 Malignant neoplasm of upper-outer quadrant of right female breast: Secondary | ICD-10-CM | POA: Diagnosis not present

## 2023-05-10 LAB — RAD ONC ARIA SESSION SUMMARY
Course Elapsed Days: 7
Plan Fractions Treated to Date: 6
Plan Prescribed Dose Per Fraction: 2 Gy
Plan Total Fractions Prescribed: 25
Plan Total Prescribed Dose: 50 Gy
Reference Point Dosage Given to Date: 12 Gy
Reference Point Session Dosage Given: 2 Gy
Session Number: 6

## 2023-05-11 ENCOUNTER — Other Ambulatory Visit: Payer: Self-pay

## 2023-05-11 ENCOUNTER — Ambulatory Visit
Admission: RE | Admit: 2023-05-11 | Discharge: 2023-05-11 | Disposition: A | Discharge status: Home / Self Care | Payer: Medicare Other | Source: Ambulatory Visit | Attending: Radiation Oncology | Admitting: Radiation Oncology

## 2023-05-11 DIAGNOSIS — C50411 Malignant neoplasm of upper-outer quadrant of right female breast: Secondary | ICD-10-CM | POA: Diagnosis not present

## 2023-05-11 LAB — RAD ONC ARIA SESSION SUMMARY
Course Elapsed Days: 8
Plan Fractions Treated to Date: 7
Plan Prescribed Dose Per Fraction: 2 Gy
Plan Total Fractions Prescribed: 25
Plan Total Prescribed Dose: 50 Gy
Reference Point Dosage Given to Date: 14 Gy
Reference Point Session Dosage Given: 2 Gy
Session Number: 7

## 2023-05-12 ENCOUNTER — Ambulatory Visit
Admission: RE | Admit: 2023-05-12 | Discharge: 2023-05-12 | Disposition: A | Discharge status: Home / Self Care | Payer: Medicare Other | Source: Ambulatory Visit | Attending: Radiation Oncology | Admitting: Radiation Oncology

## 2023-05-12 ENCOUNTER — Other Ambulatory Visit: Payer: Self-pay

## 2023-05-12 DIAGNOSIS — C50411 Malignant neoplasm of upper-outer quadrant of right female breast: Secondary | ICD-10-CM | POA: Diagnosis not present

## 2023-05-12 LAB — RAD ONC ARIA SESSION SUMMARY
Course Elapsed Days: 9
Plan Fractions Treated to Date: 8
Plan Prescribed Dose Per Fraction: 2 Gy
Plan Total Fractions Prescribed: 25
Plan Total Prescribed Dose: 50 Gy
Reference Point Dosage Given to Date: 16 Gy
Reference Point Session Dosage Given: 2 Gy
Session Number: 8

## 2023-05-15 ENCOUNTER — Ambulatory Visit
Admission: RE | Admit: 2023-05-15 | Discharge: 2023-05-15 | Disposition: A | Discharge status: Home / Self Care | Payer: Medicare Other | Source: Ambulatory Visit | Attending: Radiation Oncology | Admitting: Radiation Oncology

## 2023-05-15 ENCOUNTER — Other Ambulatory Visit: Payer: Self-pay

## 2023-05-15 ENCOUNTER — Ambulatory Visit

## 2023-05-15 DIAGNOSIS — C50411 Malignant neoplasm of upper-outer quadrant of right female breast: Secondary | ICD-10-CM | POA: Diagnosis not present

## 2023-05-15 LAB — RAD ONC ARIA SESSION SUMMARY
Course Elapsed Days: 12
Plan Fractions Treated to Date: 9
Plan Prescribed Dose Per Fraction: 2 Gy
Plan Total Fractions Prescribed: 25
Plan Total Prescribed Dose: 50 Gy
Reference Point Dosage Given to Date: 18 Gy
Reference Point Session Dosage Given: 2 Gy
Session Number: 9

## 2023-05-16 ENCOUNTER — Ambulatory Visit
Admission: RE | Admit: 2023-05-16 | Discharge: 2023-05-16 | Disposition: A | Discharge status: Home / Self Care | Payer: Medicare Other | Source: Ambulatory Visit | Attending: Radiation Oncology | Admitting: Radiation Oncology

## 2023-05-16 ENCOUNTER — Other Ambulatory Visit: Payer: Self-pay

## 2023-05-16 DIAGNOSIS — C50411 Malignant neoplasm of upper-outer quadrant of right female breast: Secondary | ICD-10-CM | POA: Diagnosis not present

## 2023-05-16 LAB — RAD ONC ARIA SESSION SUMMARY
Course Elapsed Days: 13
Plan Fractions Treated to Date: 10
Plan Prescribed Dose Per Fraction: 2 Gy
Plan Total Fractions Prescribed: 25
Plan Total Prescribed Dose: 50 Gy
Reference Point Dosage Given to Date: 20 Gy
Reference Point Session Dosage Given: 2 Gy
Session Number: 10

## 2023-05-17 ENCOUNTER — Other Ambulatory Visit: Payer: Self-pay

## 2023-05-17 ENCOUNTER — Ambulatory Visit
Admission: RE | Admit: 2023-05-17 | Discharge: 2023-05-17 | Discharge status: Home / Self Care | Payer: Medicare Other | Source: Ambulatory Visit | Attending: Radiation Oncology

## 2023-05-17 DIAGNOSIS — C50411 Malignant neoplasm of upper-outer quadrant of right female breast: Secondary | ICD-10-CM | POA: Diagnosis not present

## 2023-05-17 LAB — RAD ONC ARIA SESSION SUMMARY
Course Elapsed Days: 14
Plan Fractions Treated to Date: 11
Plan Prescribed Dose Per Fraction: 2 Gy
Plan Total Fractions Prescribed: 25
Plan Total Prescribed Dose: 50 Gy
Reference Point Dosage Given to Date: 22 Gy
Reference Point Session Dosage Given: 2 Gy
Session Number: 11

## 2023-05-18 ENCOUNTER — Ambulatory Visit
Admission: RE | Admit: 2023-05-18 | Discharge: 2023-05-18 | Disposition: A | Discharge status: Home / Self Care | Payer: Medicare Other | Source: Ambulatory Visit | Attending: Radiation Oncology

## 2023-05-18 ENCOUNTER — Other Ambulatory Visit: Payer: Self-pay

## 2023-05-18 DIAGNOSIS — C50411 Malignant neoplasm of upper-outer quadrant of right female breast: Secondary | ICD-10-CM | POA: Diagnosis not present

## 2023-05-18 LAB — RAD ONC ARIA SESSION SUMMARY
Course Elapsed Days: 15
Plan Fractions Treated to Date: 12
Plan Prescribed Dose Per Fraction: 2 Gy
Plan Total Fractions Prescribed: 25
Plan Total Prescribed Dose: 50 Gy
Reference Point Dosage Given to Date: 24 Gy
Reference Point Session Dosage Given: 2 Gy
Session Number: 12

## 2023-05-19 ENCOUNTER — Other Ambulatory Visit: Payer: Self-pay

## 2023-05-19 ENCOUNTER — Ambulatory Visit
Admission: RE | Admit: 2023-05-19 | Discharge: 2023-05-19 | Disposition: A | Discharge status: Home / Self Care | Payer: Medicare Other | Source: Ambulatory Visit | Attending: Radiation Oncology | Admitting: Radiation Oncology

## 2023-05-19 DIAGNOSIS — C50411 Malignant neoplasm of upper-outer quadrant of right female breast: Secondary | ICD-10-CM | POA: Diagnosis not present

## 2023-05-19 LAB — RAD ONC ARIA SESSION SUMMARY
Course Elapsed Days: 16
Plan Fractions Treated to Date: 13
Plan Prescribed Dose Per Fraction: 2 Gy
Plan Total Fractions Prescribed: 25
Plan Total Prescribed Dose: 50 Gy
Reference Point Dosage Given to Date: 26 Gy
Reference Point Session Dosage Given: 2 Gy
Session Number: 13

## 2023-05-22 ENCOUNTER — Ambulatory Visit
Admission: RE | Admit: 2023-05-22 | Discharge: 2023-05-22 | Disposition: A | Discharge status: Home / Self Care | Payer: Medicare Other | Source: Ambulatory Visit | Attending: Radiation Oncology | Admitting: Radiation Oncology

## 2023-05-22 ENCOUNTER — Ambulatory Visit

## 2023-05-22 ENCOUNTER — Other Ambulatory Visit: Payer: Self-pay

## 2023-05-22 DIAGNOSIS — C50411 Malignant neoplasm of upper-outer quadrant of right female breast: Secondary | ICD-10-CM | POA: Diagnosis not present

## 2023-05-22 LAB — RAD ONC ARIA SESSION SUMMARY
Course Elapsed Days: 19
Plan Fractions Treated to Date: 14
Plan Prescribed Dose Per Fraction: 2 Gy
Plan Total Fractions Prescribed: 25
Plan Total Prescribed Dose: 50 Gy
Reference Point Dosage Given to Date: 28 Gy
Reference Point Session Dosage Given: 2 Gy
Session Number: 14

## 2023-05-22 NOTE — Progress Notes (Unsigned)
 [facility] CONSULT NOTE  Patient Care Team: Marjie Skiff, NP as PCP - General (Nurse Practitioner) Pershing Proud, RN as Oncology Nurse Navigator Donnelly Angelica, RN as Oncology Nurse Navigator Harriette Bouillon, MD as Consulting Physician (General Surgery) Rachel Moulds, MD as Consulting Physician (Hematology and Oncology) Lonie Peak, MD as Attending Physician (Radiation Oncology) Charna Elizabeth, MD as Consulting Physician (Gastroenterology) Glenna Fellows, MD as Consulting Physician (Plastic Surgery)  CHIEF COMPLAINTS/PURPOSE OF CONSULTATION:  Newly diagnosed breast cancer  HISTORY OF PRESENTING ILLNESS:  Kerry Phillips 68 y.o. female is here because of recent diagnosis of right breast IDC  I reviewed her records extensively and collaborated the history with the patient.  SUMMARY OF ONCOLOGIC HISTORY: Oncology History  Malignant neoplasm of upper-outer quadrant of right breast in female, estrogen receptor positive (HCC)  12/14/2022 Mammogram   In the right breast, calcifications warranting further evaluation with magnified views.  In the left breast no finding suspicious for malignancy.  Ultrasound of the right axilla is performed showing normal lymph nodes.   01/06/2023 Pathology Results   Right breast needle core biopsy upper outer quadrant showed focal atypical ductal hyperplasia, fibroadenomatoid change with calcifications. Right breast needle core biopsy upper outer quadrant anterior aspect showed invasive ductal carcinoma, overall grade 1, DCIS low to intermediate nuclear grade.  Invasive cancer showed ER 95% positive strong staining, PR 20% positive strong staining, Ki-67 of 2% and HER2 1+   01/16/2023 Initial Diagnosis   Malignant neoplasm of upper-outer quadrant of right breast in female, estrogen receptor positive (HCC)   01/18/2023 Cancer Staging   Staging form: Breast, AJCC 8th Edition - Clinical stage from 01/18/2023: Stage IA (cT1a, cN0,  cM0, G1, ER+, PR+, HER2-) - Signed by Rachel Moulds, MD on 01/18/2023 Stage prefix: Initial diagnosis Histologic grading system: 3 grade system Laterality: Right Staged by: Pathologist and managing physician Stage used in treatment planning: Yes National guidelines used in treatment planning: Yes Type of national guideline used in treatment planning: NCCN    Genetic Testing   [facility] CancerNext-Expanded Panel+RNA was Negative. 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 Report date is 02/16/2023.  The CancerNext-Expanded gene panel offered by [facility] and includes sequencing, rearrangement, and RNA analysis for the following 76 genes: AIP, ALK, APC, ATM, AXIN2, BAP1, BARD1, BMPR1A, BRCA1, BRCA2, BRIP1, CDC73, CDH1, CDK4, CDKN1B, CDKN2A, CEBPA, CHEK2, CTNNA1, DDX41, DICER1, ETV6, FH, FLCN, GATA2, LZTR1, MAX, MBD4, MEN1, MET, MLH1, MSH2, MSH3, MSH6, MUTYH, NF1, NF2, NTHL1, PALB2, PHOX2B, PMS2, POT1, PRKAR1A, PTCH1, PTEN, RAD51C, RAD51D, RB1, RET, RUNX1, SDHA, SDHAF2, SDHB, SDHC, SDHD, SMAD4, SMARCA4, SMARCB1, SMARCE1, STK11, SUFU, TMEM127, TP53, TSC1, TSC2, VHL, and WT1 (sequencing and deletion/duplication); EGFR, HOXB13, KIT, MITF, PDGFRA, POLD1, and POLE (sequencing only); EPCAM and GREM1 (deletion/duplication only).     04/11/2023 Cancer Staging   Staging form: Breast, AJCC 8th Edition - Pathologic stage from 04/11/2023: Stage IA (pT1c, pN0, cM0, G1, ER+, PR+, HER2-) - Signed by Erven Colla, PA-C on 04/11/2023 Stage prefix: Initial diagnosis Method of lymph node assessment: Clinical Multigene prognostic tests performed: Other Histologic grading system: 3 grade system    Discussed the use of AI scribe software for clinical note transcription with the patient, who gave verbal consent to proceed.  History of Present Illness    Kerry Phillips is a 68 year old female with breast cancer who presents for follow-up   MEDICAL HISTORY:  Past Medical  History:  Diagnosis Date  Arthritis    Breast cancer (HCC)    right   Dysphagia 01/17/2017   History of blood transfusion    with childbirth   Hypertension    Hypothyroidism    surgical    SURGICAL HISTORY: Past Surgical History:  Procedure Laterality Date   ABDOMINAL HYSTERECTOMY     ARTHROSCOPIC HAGLUNDS REPAIR Right 01/04/2019   Procedure: ENDOSCOPIC RESECTION HAGLUNDS DEFORMITY;  Surgeon: Nadara Mustard, MD;  Location: [facility] OR;  Service: Orthopedics;  Laterality: Right;   BREAST BIOPSY Right 01/06/2023   MM RT BREAST BX W LOC DEV 1ST LESION IMAGE BX SPEC STEREO GUIDE 01/06/2023 [facility]   BREAST BIOPSY Right 01/06/2023   MM RT BREAST BX W LOC DEV EA AD LESION IMG BX SPEC STEREO GUIDE 01/06/2023 [facility]   BREAST BIOPSY  03/08/2023   MM RT RADIOACTIVE SEED LOC MAMMO GUIDE 03/08/2023 [facility]   BREAST BIOPSY  03/08/2023   MM RT RADIOACTIVE SEED EA ADD LESION LOC MAMMO GUIDE 03/08/2023 [facility]   BREAST LUMPECTOMY WITH RADIOACTIVE SEED AND SENTINEL LYMPH NODE BIOPSY Right 03/09/2023   Procedure: RIGHT BREAST SEED LOCALIZED LUMPECTOMY BRACKETED WITH RIGHT SENTINEL LYMPH NODE MAPPING;  Surgeon: Harriette Bouillon, MD;  Location: [facility] OR;  Service: General;  Laterality: Right;   BREAST RECONSTRUCTION Right 03/17/2023   Procedure: RIGHT ONCOPLASTIC BREAST RECONSTRUCTION;  Surgeon: Glenna Fellows, MD;  Location: [facility];  Service: Plastics;  Laterality: Right;   BREAST REDUCTION SURGERY Left 03/17/2023   Procedure: LEFT MAMMARY REDUCTION  (BREAST);  Surgeon: Glenna Fellows, MD;  Location: [facility];  Service: Plastics;  Laterality: Left;   COLONOSCOPY     THYROIDECTOMY     90% removed    SOCIAL HISTORY: Social History   Socioeconomic History   Marital status: Married    Spouse name: Coy   Number of children: 2   Years of education: Not on file   Highest education level: Not on file  Occupational History   Occupation:  self employed  Tobacco Use   Smoking status: Never   Smokeless tobacco: Never  Vaping Use   Vaping status: Never Used  Substance and Sexual Activity   Alcohol use: No   Drug use: Never   Sexual activity: Yes  Other Topics Concern   Not on file  Social History Narrative   Still working-self employed   Social Drivers of Health   Financial Resource Strain: Low Risk  (04/27/2023)   Overall Financial Resource Strain (CARDIA)    Difficulty of Paying Living Expenses: Not hard at all  Food Insecurity: No Food Insecurity (04/27/2023)   Hunger Vital Sign    Worried About Running Out of Food in the Last Year: Never true    Ran Out of Food in the Last Year: Never true  Transportation Needs: No Transportation Needs (04/27/2023)   PRAPARE - Administrator, Civil Service (Medical): No    Lack of Transportation (Non-Medical): No  Physical Activity: Sufficiently Active (04/27/2023)   Exercise Vital Sign    Days of Exercise per Week: 3 days    Minutes of Exercise per Session: 60 min  Stress: No Stress Concern Present (04/27/2023)   [employer] of Occupational Health - Occupational Stress Questionnaire    Feeling of Stress : Not at all  Social Connections: Socially Integrated (04/27/2023)   Social Connection and Isolation Panel [NHANES]    Frequency of Communication with Friends and Family: More than three times a week  Frequency of Social Gatherings with Friends and Family: More than three times a week    Attends Religious Services: More than 4 times per year    Active Member of [employer] or Organizations: Yes    Attends Engineer, structural: More than 4 times per year    Marital Status: Married  Catering manager Violence: Not At Risk (04/27/2023)   Humiliation, Afraid, Rape, and Kick questionnaire    Fear of Current or Ex-Partner: No    Emotionally Abused: No    Physically Abused: No    Sexually Abused: No    FAMILY HISTORY: Family History  Problem Relation Age of  Onset   Hypertension Mother    Lung cancer Father    Stroke Sister    Hypertension Sister    Lupus Sister    Breast cancer Sister        dx. <50   Hypertension Daughter    Hypertension Son    Breast cancer Maternal Aunt        dx. >50   Breast cancer Other        3 maternal first cousins once removed    ALLERGIES:  has no known allergies.  MEDICATIONS:  Current Outpatient Medications  Medication Sig Dispense Refill   acetaminophen (TYLENOL) 500 MG tablet Take 1,000 mg by mouth every 6 (six) hours as needed for moderate pain (pain score 4-6).     albuterol (VENTOLIN HFA) 108 (90 Base) MCG/ACT inhaler Inhale 2 puffs into the lungs every 6 (six) hours as needed. 18 g 2   calcitRIOL (ROCALTROL) 0.5 MCG capsule Take 1 capsule (0.5 mcg total) by mouth 2 (two) times daily. 180 capsule 4   cholecalciferol (VITAMIN D3) 25 MCG (1000 UNIT) tablet Take 1,000 Units by mouth daily.     Homeopathic Products (THERAWORX RELIEF EX) Apply 1 Application topically daily as needed (pain). (Patient not taking: Reported on 04/11/2023)     ibuprofen (ADVIL) 800 MG tablet Take 1 tablet (800 mg total) by mouth every 8 (eight) hours as needed. (Patient not taking: Reported on 04/27/2023) 30 tablet 0   Iron-Vitamins (GERITOL TONIC PO) Take 5 mLs by mouth daily.     levothyroxine (SYNTHROID) 100 MCG tablet TAKE 1 TABLET BY MOUTH EVERY DAY 90 tablet 1   Olmesartan-amLODIPine-HCTZ 40-5-12.5 MG TABS TAKE 1 TABLET BY MOUTH ONCE DAILY 90 tablet 4   OVER THE COUNTER MEDICATION Take 2 tablets by mouth daily. Caralluma Fimbriata (Patient not taking: Reported on 04/11/2023)     oxyCODONE (OXY IR/ROXICODONE) 5 MG immediate release tablet Take 1 tablet (5 mg total) by mouth every 6 (six) hours as needed for severe pain (pain score 7-10). (Patient not taking: Reported on 04/27/2023) 15 tablet 0   rosuvastatin (CRESTOR) 10 MG tablet Take 1 tablet (10 mg total) by mouth daily. 90 tablet 4   No current facility-administered  medications for this visit.    REVIEW OF SYSTEMS:   Constitutional: Denies fevers, chills or abnormal night sweats Eyes: Denies blurriness of vision, double vision or watery eyes Ears, nose, mouth, throat, and face: Denies mucositis or sore throat Respiratory: Denies cough, dyspnea or wheezes Cardiovascular: Denies palpitation, chest discomfort or lower extremity swelling Gastrointestinal:  Denies nausea, heartburn or change in bowel habits Skin: Denies abnormal skin rashes Lymphatics: Denies new lymphadenopathy or easy bruising Neurological:Denies numbness, tingling or new weaknesses Behavioral/Psych: Mood is stable, no new changes  Breast: Denies any palpable lumps or discharge All other systems were reviewed with the patient  and are negative.  PHYSICAL EXAMINATION: ECOG PERFORMANCE STATUS: 0 - Asymptomatic  There were no vitals filed for this visit.  There were no vitals filed for this visit.   GENERAL:alert, no distress and comfortable S/p bilateral mammoplasty, overall healing as expected.  No concern for infection.  LABORATORY DATA:  I have reviewed the data as listed Lab Results  Component Value Date   WBC 8.6 03/03/2023   HGB 11.8 (L) 03/03/2023   HCT 35.8 (L) 03/03/2023   MCV 86.3 03/03/2023   PLT 323 03/03/2023   Lab Results  Component Value Date   NA 139 03/03/2023   K 3.9 03/03/2023   CL 101 03/03/2023   CO2 29 03/03/2023    RADIOGRAPHIC STUDIES: I have personally reviewed the radiological reports and agreed with the findings in the report.  ASSESSMENT AND PLAN:  No problem-specific Assessment & Plan notes found for this encounter.   All questions were answered. The patient knows to call the clinic with any problems, questions or concerns.    Rachel Moulds, MD 05/22/23

## 2023-05-22 NOTE — Assessment & Plan Note (Signed)
 This is a very pleasant 67 year old postmenopausal female patient with newly diagnosed right breast invasive ductal carcinoma, grade 1, DCIS low to intermediate grade, ER/PR positive HER2 1+ by IHC Ki-67 of 2% presented to the breast [facility] for additional recommendations.  Given small tumor, strong ER/PR positivity and low proliferation index, she proceeded with upfront surgery. Oncotype testing inconclusive due to discontinuous tumor. Given the grade 1, strongly estrogen positive, progesterone positive, and low growth rate, decision made to proceed without chemotherapy and second oncotype. - She is now undergoing adj radiation.  Breast cancer post-radiation therapy She is completing radiation therapy and will start adjuvant hormonal therapy with aromatase inhibitors to reduce recurrence risk. Discussed potential side effects and alternative of tamoxifen as well. - Prescribe aromatase inhibitor starting Jun 29, 2023,  - Monitor for menopausal symptoms such as hot flashes, vaginal dryness, and musculoskeletal pain and bone density loss. - Schedule follow-up appointment in August 2025 to assess medication tolerance. - Consider switching to tamoxifen if aromatase inhibitor is not tolerated. - Order bone density scan to monitor for bone density loss. - Advise on daily exercise for at least 30 minutes and vitamin D supplementation.  Bone health monitoring A 2022 bone density scan was normal. Establish a new baseline due to potential bone density loss from aromatase inhibitors. - Order bone density scan to establish a new baseline. - Advise on vitamin D supplementation and calcium intake if lactose intolerant.  Follow-up  - Schedule follow-up appointment in August 2025 with nurse practitioner Mardella Layman for survivorship and toxicity assessment. - Ensure follow-up appointments are made at checkout.

## 2023-05-23 ENCOUNTER — Other Ambulatory Visit: Payer: Self-pay

## 2023-05-23 ENCOUNTER — Ambulatory Visit
Admission: RE | Admit: 2023-05-23 | Discharge: 2023-05-23 | Disposition: A | Discharge status: Home / Self Care | Payer: Medicare Other | Source: Ambulatory Visit | Attending: Radiation Oncology

## 2023-05-23 ENCOUNTER — Inpatient Hospital Stay (HOSPITAL_BASED_OUTPATIENT_CLINIC_OR_DEPARTMENT_OTHER): Payer: Medicare Other | Admitting: Hematology and Oncology

## 2023-05-23 VITALS — BP 140/73 | HR 99 | Temp 97.1°F | Resp 17 | Wt 277.2 lb

## 2023-05-23 DIAGNOSIS — Z90710 Acquired absence of both cervix and uterus: Secondary | ICD-10-CM | POA: Insufficient documentation

## 2023-05-23 DIAGNOSIS — Z801 Family history of malignant neoplasm of trachea, bronchus and lung: Secondary | ICD-10-CM | POA: Insufficient documentation

## 2023-05-23 DIAGNOSIS — Z170 Estrogen receptor positive status [ER+]: Secondary | ICD-10-CM | POA: Insufficient documentation

## 2023-05-23 DIAGNOSIS — C50411 Malignant neoplasm of upper-outer quadrant of right female breast: Secondary | ICD-10-CM | POA: Insufficient documentation

## 2023-05-23 DIAGNOSIS — Z803 Family history of malignant neoplasm of breast: Secondary | ICD-10-CM | POA: Insufficient documentation

## 2023-05-23 DIAGNOSIS — Z1721 Progesterone receptor positive status: Secondary | ICD-10-CM | POA: Insufficient documentation

## 2023-05-23 DIAGNOSIS — Z780 Asymptomatic menopausal state: Secondary | ICD-10-CM | POA: Diagnosis not present

## 2023-05-23 DIAGNOSIS — Z1732 Human epidermal growth factor receptor 2 negative status: Secondary | ICD-10-CM | POA: Insufficient documentation

## 2023-05-23 DIAGNOSIS — Z13820 Encounter for screening for osteoporosis: Secondary | ICD-10-CM | POA: Diagnosis not present

## 2023-05-23 LAB — RAD ONC ARIA SESSION SUMMARY
Course Elapsed Days: 20
Plan Fractions Treated to Date: 15
Plan Prescribed Dose Per Fraction: 2 Gy
Plan Total Fractions Prescribed: 25
Plan Total Prescribed Dose: 50 Gy
Reference Point Dosage Given to Date: 30 Gy
Reference Point Session Dosage Given: 2 Gy
Session Number: 15

## 2023-05-23 MED ORDER — ANASTROZOLE 1 MG PO TABS: 1.0000 mg | ORAL_TABLET | Freq: Every day | ORAL | 3 refills | Status: AC

## 2023-05-24 ENCOUNTER — Ambulatory Visit
Admission: RE | Admit: 2023-05-24 | Discharge: 2023-05-24 | Disposition: A | Discharge status: Home / Self Care | Payer: Medicare Other | Source: Ambulatory Visit | Attending: Radiation Oncology | Admitting: Radiation Oncology

## 2023-05-24 ENCOUNTER — Other Ambulatory Visit: Payer: Self-pay

## 2023-05-24 DIAGNOSIS — C50411 Malignant neoplasm of upper-outer quadrant of right female breast: Secondary | ICD-10-CM | POA: Diagnosis not present

## 2023-05-24 LAB — RAD ONC ARIA SESSION SUMMARY
Course Elapsed Days: 21
Plan Fractions Treated to Date: 16
Plan Prescribed Dose Per Fraction: 2 Gy
Plan Total Fractions Prescribed: 25
Plan Total Prescribed Dose: 50 Gy
Reference Point Dosage Given to Date: 32 Gy
Reference Point Session Dosage Given: 2 Gy
Session Number: 16

## 2023-05-25 ENCOUNTER — Other Ambulatory Visit: Payer: Self-pay

## 2023-05-25 ENCOUNTER — Ambulatory Visit
Admission: RE | Admit: 2023-05-25 | Discharge: 2023-05-25 | Disposition: A | Discharge status: Home / Self Care | Payer: Medicare Other | Source: Ambulatory Visit | Attending: Radiation Oncology | Admitting: Radiation Oncology

## 2023-05-25 DIAGNOSIS — C50411 Malignant neoplasm of upper-outer quadrant of right female breast: Secondary | ICD-10-CM | POA: Diagnosis not present

## 2023-05-25 LAB — RAD ONC ARIA SESSION SUMMARY
Course Elapsed Days: 22
Plan Fractions Treated to Date: 17
Plan Prescribed Dose Per Fraction: 2 Gy
Plan Total Fractions Prescribed: 25
Plan Total Prescribed Dose: 50 Gy
Reference Point Dosage Given to Date: 34 Gy
Reference Point Session Dosage Given: 2 Gy
Session Number: 17

## 2023-05-26 ENCOUNTER — Other Ambulatory Visit: Payer: Self-pay

## 2023-05-26 ENCOUNTER — Ambulatory Visit
Admission: RE | Admit: 2023-05-26 | Discharge: 2023-05-26 | Disposition: A | Discharge status: Home / Self Care | Payer: Medicare Other | Source: Ambulatory Visit | Attending: Radiation Oncology | Admitting: Radiation Oncology

## 2023-05-26 DIAGNOSIS — C50411 Malignant neoplasm of upper-outer quadrant of right female breast: Secondary | ICD-10-CM | POA: Diagnosis not present

## 2023-05-26 LAB — RAD ONC ARIA SESSION SUMMARY
Course Elapsed Days: 23
Plan Fractions Treated to Date: 18
Plan Prescribed Dose Per Fraction: 2 Gy
Plan Total Fractions Prescribed: 25
Plan Total Prescribed Dose: 50 Gy
Reference Point Dosage Given to Date: 36 Gy
Reference Point Session Dosage Given: 2 Gy
Session Number: 18

## 2023-05-29 ENCOUNTER — Ambulatory Visit
Admission: RE | Admit: 2023-05-29 | Discharge: 2023-05-29 | Disposition: A | Discharge status: Home / Self Care | Source: Ambulatory Visit | Attending: Radiation Oncology

## 2023-05-29 ENCOUNTER — Other Ambulatory Visit: Payer: Self-pay

## 2023-05-29 ENCOUNTER — Ambulatory Visit
Admission: RE | Admit: 2023-05-29 | Discharge: 2023-05-29 | Disposition: A | Discharge status: Home / Self Care | Payer: Medicare Other | Source: Ambulatory Visit | Attending: Radiation Oncology | Admitting: Radiation Oncology

## 2023-05-29 DIAGNOSIS — C50411 Malignant neoplasm of upper-outer quadrant of right female breast: Secondary | ICD-10-CM | POA: Diagnosis not present

## 2023-05-29 LAB — RAD ONC ARIA SESSION SUMMARY
Course Elapsed Days: 26
Plan Fractions Treated to Date: 19
Plan Prescribed Dose Per Fraction: 2 Gy
Plan Total Fractions Prescribed: 25
Plan Total Prescribed Dose: 50 Gy
Reference Point Dosage Given to Date: 38 Gy
Reference Point Session Dosage Given: 2 Gy
Session Number: 19

## 2023-05-30 ENCOUNTER — Other Ambulatory Visit: Payer: Self-pay | Admitting: Nurse Practitioner

## 2023-05-30 ENCOUNTER — Other Ambulatory Visit: Payer: Self-pay

## 2023-05-30 ENCOUNTER — Ambulatory Visit
Admission: RE | Admit: 2023-05-30 | Discharge: 2023-05-30 | Disposition: A | Discharge status: Home / Self Care | Payer: Medicare Other | Source: Ambulatory Visit | Attending: Radiation Oncology | Admitting: Radiation Oncology

## 2023-05-30 DIAGNOSIS — C50411 Malignant neoplasm of upper-outer quadrant of right female breast: Secondary | ICD-10-CM | POA: Diagnosis present

## 2023-05-30 DIAGNOSIS — Z1721 Progesterone receptor positive status: Secondary | ICD-10-CM | POA: Diagnosis not present

## 2023-05-30 DIAGNOSIS — Z1732 Human epidermal growth factor receptor 2 negative status: Secondary | ICD-10-CM | POA: Insufficient documentation

## 2023-05-30 DIAGNOSIS — Z170 Estrogen receptor positive status [ER+]: Secondary | ICD-10-CM | POA: Insufficient documentation

## 2023-05-30 LAB — RAD ONC ARIA SESSION SUMMARY
Course Elapsed Days: 27
Plan Fractions Treated to Date: 20
Plan Prescribed Dose Per Fraction: 2 Gy
Plan Total Fractions Prescribed: 25
Plan Total Prescribed Dose: 50 Gy
Reference Point Dosage Given to Date: 40 Gy
Reference Point Session Dosage Given: 2 Gy
Session Number: 20

## 2023-05-31 ENCOUNTER — Ambulatory Visit
Admission: RE | Admit: 2023-05-31 | Discharge: 2023-05-31 | Disposition: A | Discharge status: Home / Self Care | Payer: Medicare Other | Source: Ambulatory Visit | Attending: Radiation Oncology

## 2023-05-31 ENCOUNTER — Other Ambulatory Visit: Payer: Self-pay

## 2023-05-31 DIAGNOSIS — C50411 Malignant neoplasm of upper-outer quadrant of right female breast: Secondary | ICD-10-CM | POA: Diagnosis not present

## 2023-05-31 LAB — RAD ONC ARIA SESSION SUMMARY
Course Elapsed Days: 28
Plan Fractions Treated to Date: 21
Plan Prescribed Dose Per Fraction: 2 Gy
Plan Total Fractions Prescribed: 25
Plan Total Prescribed Dose: 50 Gy
Reference Point Dosage Given to Date: 42 Gy
Reference Point Session Dosage Given: 2 Gy
Session Number: 21

## 2023-06-01 ENCOUNTER — Other Ambulatory Visit: Payer: Self-pay

## 2023-06-01 ENCOUNTER — Ambulatory Visit
Admission: RE | Admit: 2023-06-01 | Discharge: 2023-06-01 | Disposition: A | Discharge status: Home / Self Care | Payer: Medicare Other | Source: Ambulatory Visit | Attending: Radiation Oncology | Admitting: Radiation Oncology

## 2023-06-01 DIAGNOSIS — C50411 Malignant neoplasm of upper-outer quadrant of right female breast: Secondary | ICD-10-CM | POA: Diagnosis not present

## 2023-06-01 LAB — RAD ONC ARIA SESSION SUMMARY
Course Elapsed Days: 29
Plan Fractions Treated to Date: 22
Plan Prescribed Dose Per Fraction: 2 Gy
Plan Total Fractions Prescribed: 25
Plan Total Prescribed Dose: 50 Gy
Reference Point Dosage Given to Date: 44 Gy
Reference Point Session Dosage Given: 2 Gy
Session Number: 22

## 2023-06-01 NOTE — Telephone Encounter (Signed)
 Requested Prescriptions  Pending Prescriptions Disp Refills   levothyroxine (SYNTHROID) 100 MCG tablet [Pharmacy Med Name: LEVOTHYROXINE 100 MCG TABLET] 90 tablet 0    Sig: TAKE 1 TABLET BY MOUTH EVERY DAY     Endocrinology:  Hypothyroid Agents Passed - 06/01/2023  8:40 AM      Passed - TSH in normal range and within 360 days    TSH  Date Value Ref Range Status  07/13/2022 2.150 0.450 - 4.500 uIU/mL Final         Passed - Valid encounter within last 12 months    Recent Outpatient Visits   None     Future Appointments             In 1 month Cannady, Dorie Rank, NP [facility]

## 2023-06-02 ENCOUNTER — Other Ambulatory Visit: Payer: Self-pay

## 2023-06-02 ENCOUNTER — Ambulatory Visit
Admission: RE | Admit: 2023-06-02 | Discharge: 2023-06-02 | Disposition: A | Discharge status: Home / Self Care | Payer: Medicare Other | Source: Ambulatory Visit | Attending: Radiation Oncology | Admitting: Radiation Oncology

## 2023-06-02 DIAGNOSIS — C50411 Malignant neoplasm of upper-outer quadrant of right female breast: Secondary | ICD-10-CM | POA: Diagnosis not present

## 2023-06-02 LAB — RAD ONC ARIA SESSION SUMMARY
Course Elapsed Days: 30
Plan Fractions Treated to Date: 23
Plan Prescribed Dose Per Fraction: 2 Gy
Plan Total Fractions Prescribed: 25
Plan Total Prescribed Dose: 50 Gy
Reference Point Dosage Given to Date: 46 Gy
Reference Point Session Dosage Given: 2 Gy
Session Number: 23

## 2023-06-05 ENCOUNTER — Other Ambulatory Visit: Payer: Self-pay

## 2023-06-05 ENCOUNTER — Ambulatory Visit
Admission: RE | Admit: 2023-06-05 | Discharge: 2023-06-05 | Disposition: A | Discharge status: Home / Self Care | Source: Ambulatory Visit | Attending: Radiation Oncology

## 2023-06-05 ENCOUNTER — Ambulatory Visit
Admission: RE | Admit: 2023-06-05 | Discharge: 2023-06-05 | Disposition: A | Discharge status: Home / Self Care | Payer: Medicare Other | Source: Ambulatory Visit | Attending: Radiation Oncology

## 2023-06-05 DIAGNOSIS — C50411 Malignant neoplasm of upper-outer quadrant of right female breast: Secondary | ICD-10-CM | POA: Diagnosis not present

## 2023-06-05 LAB — RAD ONC ARIA SESSION SUMMARY
Course Elapsed Days: 33
Plan Fractions Treated to Date: 24
Plan Prescribed Dose Per Fraction: 2 Gy
Plan Total Fractions Prescribed: 25
Plan Total Prescribed Dose: 50 Gy
Reference Point Dosage Given to Date: 48 Gy
Reference Point Session Dosage Given: 2 Gy
Session Number: 24

## 2023-06-06 ENCOUNTER — Other Ambulatory Visit: Payer: Self-pay

## 2023-06-06 ENCOUNTER — Ambulatory Visit
Admission: RE | Admit: 2023-06-06 | Discharge: 2023-06-06 | Disposition: A | Discharge status: Home / Self Care | Payer: Medicare Other | Source: Ambulatory Visit | Attending: Radiation Oncology | Admitting: Radiation Oncology

## 2023-06-06 DIAGNOSIS — C50411 Malignant neoplasm of upper-outer quadrant of right female breast: Secondary | ICD-10-CM | POA: Diagnosis not present

## 2023-06-06 LAB — RAD ONC ARIA SESSION SUMMARY
Course Elapsed Days: 34
Plan Fractions Treated to Date: 25
Plan Prescribed Dose Per Fraction: 2 Gy
Plan Total Fractions Prescribed: 25
Plan Total Prescribed Dose: 50 Gy
Reference Point Dosage Given to Date: 50 Gy
Reference Point Session Dosage Given: 2 Gy
Session Number: 25

## 2023-06-07 NOTE — Radiation Completion Notes (Signed)
 Patient Name: Kerry Phillips, Kerry Phillips MRN: [id number] Date of Birth: 11-Sep-1955 Referring Physician: Aura Dials, M.D. Date of Service: 2023-06-07 Radiation Oncologist: Lonie Peak, M.D. [facility] [place]                             RADIATION ONCOLOGY END OF TREATMENT NOTE     Diagnosis: C50.411 Malignant neoplasm of upper-outer quadrant of right female breast Staging on 2023-04-11: Malignant neoplasm of upper-outer quadrant of right breast in female, estrogen receptor positive (HCC) T=pT1c, N=pN0, M=cM0 Staging on 2023-01-18: Malignant neoplasm of upper-outer quadrant of right breast in female, estrogen receptor positive (HCC) T=cT1a, N=cN0, M=cM0 Intent: Curative     ==========DELIVERED PLANS==========  First Treatment Date: 2023-05-03 Last Treatment Date: 2023-06-06   Plan Name: Breast_R Site: Breast, Right Technique: 3D Mode: Photon Dose Per Fraction: 2 Gy Prescribed Dose (Delivered / Prescribed): 50 Gy / 50 Gy Prescribed Fxs (Delivered / Prescribed): 25 / 25     ==========ON TREATMENT VISIT DATES========== 2023-05-03, 2023-05-15, 2023-05-22, 2023-05-29, 2023-06-05     ==========UPCOMING VISITS========== 07/18/2023 [facility] Marjie Skiff, NP  07/06/2023 [facility] ONC FOLLOW UP 20 Lonie Peak, MD  06/12/2023 [facility] AT [facility] SOZO SCREEN Berna Spare A, PTA        ==========APPENDIX - ON TREATMENT VISIT NOTES==========   See weekly On Treatment Notes in Epic for details in the Media tab (listed as Progress notes on the On Treatment Visit Dates listed above).

## 2023-06-12 ENCOUNTER — Ambulatory Visit: Attending: Surgery

## 2023-06-12 VITALS — Wt 276.2 lb

## 2023-06-12 DIAGNOSIS — Z483 Aftercare following surgery for neoplasm: Secondary | ICD-10-CM | POA: Insufficient documentation

## 2023-06-12 NOTE — Therapy (Signed)
 OUTPATIENT PHYSICAL THERAPY SOZO SCREENING NOTE   Patient Name: Kerry Phillips MRN: [id number] DOB:03/20/1955, 68 y.o., female Today's Date: 06/12/2023  PCP: Marjie Skiff, NP REFERRING PROVIDER: Harriette Bouillon, MD   PT End of Session - 06/12/23 1049     Visit Number 2   # unchanged due to screen only   PT Start Time 1047    PT Stop Time 1058    PT Time Calculation (min) 11 min    Activity Tolerance Patient tolerated treatment well    Behavior During Therapy [facility] for tasks assessed/performed             Past Medical History:  Diagnosis Date   Arthritis    Breast cancer (HCC)    right   Dysphagia 01/17/2017   History of blood transfusion    with childbirth   Hypertension    Hypothyroidism    surgical   Past Surgical History:  Procedure Laterality Date   ABDOMINAL HYSTERECTOMY     ARTHROSCOPIC HAGLUNDS REPAIR Right 01/04/2019   Procedure: ENDOSCOPIC RESECTION HAGLUNDS DEFORMITY;  Surgeon: Nadara Mustard, MD;  Location: [facility] OR;  Service: Orthopedics;  Laterality: Right;   BREAST BIOPSY Right 01/06/2023   MM RT BREAST BX W LOC DEV 1ST LESION IMAGE BX SPEC STEREO GUIDE 01/06/2023 [facility]   BREAST BIOPSY Right 01/06/2023   MM RT BREAST BX W LOC DEV EA AD LESION IMG BX SPEC STEREO GUIDE 01/06/2023 [facility]   BREAST BIOPSY  03/08/2023   MM RT RADIOACTIVE SEED LOC MAMMO GUIDE 03/08/2023 [facility]   BREAST BIOPSY  03/08/2023   MM RT RADIOACTIVE SEED EA ADD LESION LOC MAMMO GUIDE 03/08/2023 [facility]   BREAST LUMPECTOMY WITH RADIOACTIVE SEED AND SENTINEL LYMPH NODE BIOPSY Right 03/09/2023   Procedure: RIGHT BREAST SEED LOCALIZED LUMPECTOMY BRACKETED WITH RIGHT SENTINEL LYMPH NODE MAPPING;  Surgeon: Harriette Bouillon, MD;  Location: [facility] OR;  Service: General;  Laterality: Right;   BREAST RECONSTRUCTION Right 03/17/2023   Procedure: RIGHT ONCOPLASTIC BREAST RECONSTRUCTION;  Surgeon: Glenna Fellows, MD;  Location: [facility];  Service:  Plastics;  Laterality: Right;   BREAST REDUCTION SURGERY Left 03/17/2023   Procedure: LEFT MAMMARY REDUCTION  (BREAST);  Surgeon: Glenna Fellows, MD;  Location: [facility];  Service: Plastics;  Laterality: Left;   COLONOSCOPY     THYROIDECTOMY     90% removed   Patient Active Problem List   Diagnosis Date Noted   Genetic testing 02/17/2023   Gastroesophageal reflux disease 01/18/2023   History of colonic polyps 01/18/2023   Malignant neoplasm of upper-outer quadrant of right breast in female, estrogen receptor positive (HCC) 01/16/2023   No vaccination-pt refuse 01/08/2023   Exercise-induced asthma 01/06/2022   Stage 3a chronic kidney disease (CKD) (HCC) 07/25/2021   Elevated LDL cholesterol level 10/15/2020   Vitamin D deficiency 10/15/2020   Morbid obesity (HCC) 03/22/2018   Hypocalcemia 09/07/2017   BMI 45.0-49.9, adult (HCC) 04/05/2016   Arthritis of both knees 04/05/2016   Essential hypertension 12/22/2014   Hashimoto's thyroiditis 12/22/2014    REFERRING DIAG: right breast cancer at risk for lymphedema  THERAPY DIAG: Aftercare following surgery for neoplasm  PERTINENT HISTORY: Patient was diagnosed on 12/14/2022 with right grade I invasive ductal carcinoma breast cancer. It measures 15 cm and is located in the upper outer quadrant. It is ER/PR positive and HER2 negative with a Ki67 of 2%. 03/09/23- R lumpectomy and SLNB 0/7, 03/17/23- R oncoplastic reconstruction and L reduction  PRECAUTIONS: right UE Lymphedema risk, None  SUBJECTIVE: Pt returns for her first 3 month L-Dex screen.   PAIN:  Are you having pain? No  SOZO SCREENING:  Patient was assessed today using the SOZO machine to determine the lymphedema index score. This was compared to her baseline score. It was determined that she is NOT within the recommended range when compared to her baseline and so she was fitted for a compression garment while in the clinic today and will need to order a  Sigvaris Secure, size M3, from [employer] as she does not fit into [employer] sizes. It is recommended she return in 1 month to be reassessed. If she continues to measure outside the recommended range, physical therapy treatment will be recommended at that time and a referral requested. Also issued handout regarding wear and care of garment, see below.   L-DEX FLOWSHEETS - 06/12/23 1300       L-DEX LYMPHEDEMA SCREENING   Measurement Type Unilateral    L-DEX MEASUREMENT EXTREMITY Upper Extremity    POSITION  Standing    DOMINANT SIDE Right    At Risk Side Right    BASELINE SCORE (UNILATERAL) -4.5    L-DEX SCORE (UNILATERAL) 2.4    VALUE CHANGE (UNILAT) 6.9             L-DEX FLOWSHEETS - 06/12/23 1300       L-DEX LYMPHEDEMA SCREENING   Measurement Type Unilateral    L-DEX MEASUREMENT EXTREMITY Upper Extremity    POSITION  Standing    DOMINANT SIDE Right    At Risk Side Right    BASELINE SCORE (UNILATERAL) -4.5    L-DEX SCORE (UNILATERAL) 2.4    VALUE CHANGE (UNILAT) 6.9               Denyce Flank, PTA 06/12/2023, 1:44 PM    PLEASE KEEP YOUR COMPRESSION GARMENT ON DURING THE DAY TO GET THE BEST SWELLING REDUCTION. HERE ARE SOME ADDITIONAL TIPS: Do not sleep in your garment. If you have pain or notice swelling in your hand or at the top of your shoulder, call your therapist. This may be a sign that you need a different garment. 3.  Take good care of your garment so it lasts longer: Follow washing instructions on your garment label or box. Wash periodically using a mild detergent in warm water.  Do not use fabric softener or bleach.  Place garment in a mesh lingerie bag and use the gentle cycle of the washing machine or hand wash. Tumble dry low or lay flat to dry. TAKE CARE OF YOUR SKIN Apply a low pH moisturizing lotion to your skin daily Avoid scratching your skin Treat skin irritations quickly  Know the 5 warning signs of infection: redness, pain,  warmth to touch, fever and increased swelling.  Call your physician immediately if you notice any of these signs of a possible infection.   Cancer Rehab [phone]

## 2023-06-27 NOTE — Progress Notes (Addendum)
  Kerry Phillips presents today for a follow-up after completing radiation to her right breast on 06/06/2023. Patient is doing well and is exercising and living a healthy lifestyle. Pain: Patient denies pain but does have some breast tenderness. Skin: Skin is healing well, is using vitamin E oil, skin color is coming back to normal. ROM: Patient denies issues with range of motion Lymphedema: None MedOnc F/U: July 2025 Other issues of note:  None  Pt reports Yes No Comments  Tamoxifen []  []    Letrozole []  []    Anastrazole [x]  []     Mammogram [x]  Date: December 31, 2022 []      Encouraged patient to go to appointments and to call our office with any questions or concerns.

## 2023-07-06 ENCOUNTER — Ambulatory Visit
Admission: RE | Admit: 2023-07-06 | Discharge: 2023-07-06 | Disposition: A | Discharge status: Home / Self Care | Source: Ambulatory Visit | Attending: Radiation Oncology | Admitting: Radiation Oncology

## 2023-07-06 DIAGNOSIS — Z170 Estrogen receptor positive status [ER+]: Secondary | ICD-10-CM

## 2023-07-13 ENCOUNTER — Ambulatory Visit: Attending: Surgery

## 2023-07-13 VITALS — Wt 265.2 lb

## 2023-07-13 DIAGNOSIS — Z483 Aftercare following surgery for neoplasm: Secondary | ICD-10-CM | POA: Insufficient documentation

## 2023-07-13 NOTE — Therapy (Signed)
 OUTPATIENT PHYSICAL THERAPY SOZO SCREENING NOTE   Patient Name: MINNETTE MUKES MRN: [id number] DOB:[birth year], 68 y.o., female Today's Date: 07/13/2023  PCP: Lemar Pyles, NP REFERRING PROVIDER: Lemar Pyles, NP   PT End of Session - 07/13/23 1203     Visit Number 2   # unchanged due to screen only   PT Start Time 1201    PT Stop Time 1206    PT Time Calculation (min) 5 min    Activity Tolerance Patient tolerated treatment well    Behavior During Therapy [facility] for tasks assessed/performed             Past Medical History:  Diagnosis Date   Arthritis    Breast cancer (HCC)    right   Dysphagia 01/17/2017   History of blood transfusion    with childbirth   Hypertension    Hypothyroidism    surgical   Past Surgical History:  Procedure Laterality Date   ABDOMINAL HYSTERECTOMY     ARTHROSCOPIC HAGLUNDS REPAIR Right 01/04/2019   Procedure: ENDOSCOPIC RESECTION HAGLUNDS DEFORMITY;  Surgeon: Timothy Ford, MD;  Location: [facility] OR;  Service: Orthopedics;  Laterality: Right;   BREAST BIOPSY Right 01/06/2023   MM RT BREAST BX W LOC DEV 1ST LESION IMAGE BX SPEC STEREO GUIDE 01/06/2023 [facility]   BREAST BIOPSY Right 01/06/2023   MM RT BREAST BX W LOC DEV EA AD LESION IMG BX SPEC STEREO GUIDE 01/06/2023 [facility]   BREAST BIOPSY  03/08/2023   MM RT RADIOACTIVE SEED LOC MAMMO GUIDE 03/08/2023 [facility]   BREAST BIOPSY  03/08/2023   MM RT RADIOACTIVE SEED EA ADD LESION LOC MAMMO GUIDE 03/08/2023 [facility]   BREAST LUMPECTOMY WITH RADIOACTIVE SEED AND SENTINEL LYMPH NODE BIOPSY Right 03/09/2023   Procedure: RIGHT BREAST SEED LOCALIZED LUMPECTOMY BRACKETED WITH RIGHT SENTINEL LYMPH NODE MAPPING;  Surgeon: Sim Dryer, MD;  Location: [facility] OR;  Service: General;  Laterality: Right;   BREAST RECONSTRUCTION Right 03/17/2023   Procedure: RIGHT ONCOPLASTIC BREAST RECONSTRUCTION;  Surgeon: Alger Infield, MD;  Location: [facility];  Service:  Plastics;  Laterality: Right;   BREAST REDUCTION SURGERY Left 03/17/2023   Procedure: LEFT MAMMARY REDUCTION  (BREAST);  Surgeon: Alger Infield, MD;  Location: [facility];  Service: Plastics;  Laterality: Left;   COLONOSCOPY     THYROIDECTOMY     90% removed   Patient Active Problem List   Diagnosis Date Noted   Genetic testing 02/17/2023   Gastroesophageal reflux disease 01/18/2023   History of colonic polyps 01/18/2023   Malignant neoplasm of upper-outer quadrant of right breast in female, estrogen receptor positive (HCC) 01/16/2023   No vaccination-pt refuse 01/08/2023   Exercise-induced asthma 01/06/2022   Stage 3a chronic kidney disease (CKD) (HCC) 07/25/2021   Elevated LDL cholesterol level 10/15/2020   Vitamin D  deficiency 10/15/2020   Morbid obesity (HCC) 03/22/2018   Hypocalcemia 09/07/2017   BMI 45.0-49.9, adult (HCC) 04/05/2016   Arthritis of both knees 04/05/2016   Essential hypertension 12/22/2014   Hashimoto's thyroiditis 12/22/2014    REFERRING DIAG: right breast cancer at risk for lymphedema  THERAPY DIAG:  Aftercare following surgery for neoplasm  PERTINENT HISTORY:  Patient was diagnosed on 12/14/2022 with right grade I invasive ductal carcinoma breast cancer. It measures 15 cm and is located in the upper outer quadrant. It is ER/PR positive and HER2 negative with a Ki67 of 2%. 03/09/23- R lumpectomy and SLNB 0/7, 03/17/23- R oncoplastic reconstruction and  L reduction  PRECAUTIONS: right UE Lymphedema risk, None  SUBJECTIVE: Pt returns for her 1 month L-Dex follow up after having a high change from baseline. "I wore my sleeve every day for 10 hrs like you told me."  PAIN:  Are you having pain? No  SOZO SCREENING: Patient was assessed today using the SOZO machine to determine the lymphedema index score. This was compared to her baseline score. It was determined that she is within the recommended range when compared to her baseline and no  further action is needed at this time. She will continue SOZO screenings. These are done every 3 months for 2 years post operatively followed by every 6 months for 2 years, and then annually.  Advised her to wean off of sleeve over next 1-2 weeks by decreasing wear time to 4-5 hrs. Also to wear it during periods of highly repetitive activities. She verbalized good understanding.   L-DEX FLOWSHEETS - 07/13/23 1200       L-DEX LYMPHEDEMA SCREENING   Measurement Type Unilateral    L-DEX MEASUREMENT EXTREMITY Upper Extremity    POSITION  Standing    DOMINANT SIDE Right    At Risk Side Right    BASELINE SCORE (UNILATERAL) -4.5    L-DEX SCORE (UNILATERAL) -5    VALUE CHANGE (UNILAT) -0.5            P: Pt can return to 3 month L-Dex screens.    Denyce Flank, PTA 07/13/2023, 12:08 PM

## 2023-07-15 NOTE — Patient Instructions (Signed)
 Be Involved in Caring For Your Health:  Taking Medications When medications are taken as directed, they can greatly improve your health. But if they are not taken as prescribed, they may not work. In some cases, not taking them correctly can be harmful. To help ensure your treatment remains effective and safe, understand your medications and how to take them. Bring your medications to each visit for review by your provider.  Your lab results, notes, and after visit summary will be available on My Chart. We strongly encourage you to use this feature. If lab results are abnormal the clinic will contact you with the appropriate steps. If the clinic does not contact you assume the results are satisfactory. You can always view your results on My Chart. If you have questions regarding your health or results, please contact the clinic during office hours. You can also ask questions on My Chart.  We at [facility] are grateful that you chose [place] to provide your care. We strive to provide evidence-based and compassionate care and are always looking for feedback. If you get a survey from the clinic please complete this so we can hear your opinions.  Heart-Healthy Eating Plan Many factors influence your heart health, including eating and exercise habits. Heart health is also called coronary health. Coronary risk increases with abnormal blood fat (lipid) levels. A heart-healthy eating plan includes limiting unhealthy fats, increasing healthy fats, limiting salt (sodium) intake, and making other diet and lifestyle changes. What is my plan? Your health care provider may recommend that: You limit your fat intake to _________% or less of your total calories each day. You limit your saturated fat intake to _________% or less of your total calories each day. You limit the amount of cholesterol in your diet to less than _________ mg per day. You limit the amount of sodium in your diet to less than _________  mg per day. What are tips for following this plan? Cooking Cook foods using methods other than frying. Baking, boiling, grilling, and broiling are all good options. Other ways to reduce fat include: Removing the skin from poultry. Removing all visible fats from meats. Steaming vegetables in water or broth. Meal planning  At meals, imagine dividing your plate into fourths: Fill one-half of your plate with vegetables and green salads. Fill one-fourth of your plate with whole grains. Fill one-fourth of your plate with lean protein foods. Eat 2-4 cups of vegetables per day. One cup of vegetables equals 1 cup (91 g) broccoli or cauliflower florets, 2 medium carrots, 1 large bell pepper, 1 large sweet potato, 1 large tomato, 1 medium white potato, 2 cups (150 g) raw leafy greens. Eat 1-2 cups of fruit per day. One cup of fruit equals 1 small apple, 1 large banana, 1 cup (237 g) mixed fruit, 1 large orange,  cup (82 g) dried fruit, 1 cup (240 mL) 100% fruit juice. Eat more foods that contain soluble fiber. Examples include apples, broccoli, carrots, beans, peas, and barley. Aim to get 25-30 g of fiber per day. Increase your consumption of legumes, nuts, and seeds to 4-5 servings per week. One serving of dried beans or legumes equals  cup (90 g) cooked, 1 serving of nuts is  oz (12 almonds, 24 pistachios, or 7 walnut halves), and 1 serving of seeds equals  oz (8 g). Fats Choose healthy fats more often. Choose monounsaturated and polyunsaturated fats, such as olive and canola oils, avocado oil, flaxseeds, walnuts, almonds, and seeds. Eat  more omega-3 fats. Choose salmon, mackerel, sardines, tuna, flaxseed oil, and ground flaxseeds. Aim to eat fish at least 2 times each week. Check food labels carefully to identify foods with trans fats or high amounts of saturated fat. Limit saturated fats. These are found in animal products, such as meats, butter, and cream. Plant sources of saturated fats  include palm oil, palm kernel oil, and coconut oil. Avoid foods with partially hydrogenated oils in them. These contain trans fats. Examples are stick margarine, some tub margarines, cookies, crackers, and other baked goods. Avoid fried foods. General information Eat more home-cooked food and less restaurant, buffet, and fast food. Limit or avoid alcohol. Limit foods that are high in added sugar and simple starches such as foods made using white refined flour (white breads, pastries, sweets). Lose weight if you are overweight. Losing just 5-10% of your body weight can help your overall health and prevent diseases such as diabetes and heart disease. Monitor your sodium intake, especially if you have high blood pressure. Talk with your health care provider about your sodium intake. Try to incorporate more vegetarian meals weekly. What foods should I eat? Fruits All fresh, canned (in natural juice), or frozen fruits. Vegetables Fresh or frozen vegetables (raw, steamed, roasted, or grilled). Green salads. Grains Most grains. Choose whole wheat and whole grains most of the time. Rice and pasta, including brown rice and pastas made with whole wheat. Meats and other proteins Lean, well-trimmed beef, veal, pork, and lamb. Chicken and [place] without skin. All fish and shellfish. Wild duck, rabbit, pheasant, and venison. Egg whites or low-cholesterol egg substitutes. Dried beans, peas, lentils, and tofu. Seeds and most nuts. Dairy Low-fat or nonfat cheeses, including ricotta and mozzarella. Skim or 1% milk (liquid, powdered, or evaporated). Buttermilk made with low-fat milk. Nonfat or low-fat yogurt. Fats and oils Non-hydrogenated (trans-free) margarines. Vegetable oils, including soybean, sesame, sunflower, olive, avocado, peanut, safflower, corn, canola, and cottonseed. Salad dressings or mayonnaise made with a vegetable oil. Beverages Water (mineral or sparkling). Coffee and tea. Unsweetened ice  tea. Diet beverages. Sweets and desserts Sherbet, gelatin, and fruit ice. Small amounts of dark chocolate. Limit all sweets and desserts. Seasonings and condiments All seasonings and condiments. The items listed above may not be a complete list of foods and beverages you can eat. Contact a dietitian for more options. What foods should I avoid? Fruits Canned fruit in heavy syrup. Fruit in cream or butter sauce. Fried fruit. Limit coconut. Vegetables Vegetables cooked in cheese, cream, or butter sauce. Fried vegetables. Grains Breads made with saturated or trans fats, oils, or whole milk. Croissants. Sweet rolls. Donuts. High-fat crackers, such as cheese crackers and chips. Meats and other proteins Fatty meats, such as hot dogs, ribs, sausage, bacon, rib-eye roast or steak. High-fat deli meats, such as salami and bologna. Caviar. Domestic duck and goose. Organ meats, such as liver. Dairy Cream, sour cream, cream cheese, and creamed cottage cheese. Whole-milk cheeses. Whole or 2% milk (liquid, evaporated, or condensed). Whole buttermilk. Cream sauce or high-fat cheese sauce. Whole-milk yogurt. Fats and oils Meat fat, or shortening. Cocoa butter, hydrogenated oils, palm oil, coconut oil, palm kernel oil. Solid fats and shortenings, including bacon fat, salt pork, lard, and butter. Nondairy cream substitutes. Salad dressings with cheese or sour cream. Beverages Regular sodas and any drinks with added sugar. Sweets and desserts Frosting. Pudding. Cookies. Cakes. Pies. Milk chocolate or white chocolate. Buttered syrups. Full-fat ice cream or ice cream drinks. The items listed above may  not be a complete list of foods and beverages to avoid. Contact a dietitian for more information. Summary Heart-healthy meal planning includes limiting unhealthy fats, increasing healthy fats, limiting salt (sodium) intake and making other diet and lifestyle changes. Lose weight if you are overweight. Losing just  5-10% of your body weight can help your overall health and prevent diseases such as diabetes and heart disease. Focus on eating a balance of foods, including fruits and vegetables, low-fat or nonfat dairy, lean protein, nuts and legumes, whole grains, and heart-healthy oils and fats. This information is not intended to replace advice given to you by your health care provider. Make sure you discuss any questions you have with your health care provider. Document Revised: 03/22/2021 Document Reviewed: 03/22/2021 Elsevier Patient Education  2024 [employer].

## 2023-07-18 ENCOUNTER — Ambulatory Visit: Payer: Self-pay | Admitting: Nurse Practitioner

## 2023-07-18 ENCOUNTER — Encounter: Payer: Self-pay | Admitting: Nurse Practitioner

## 2023-07-18 VITALS — BP 128/78 | HR 98 | Temp 97.6°F | Wt 273.4 lb

## 2023-07-18 DIAGNOSIS — Z6841 Body Mass Index (BMI) 40.0 and over, adult: Secondary | ICD-10-CM

## 2023-07-18 DIAGNOSIS — Z170 Estrogen receptor positive status [ER+]: Secondary | ICD-10-CM

## 2023-07-18 DIAGNOSIS — E559 Vitamin D deficiency, unspecified: Secondary | ICD-10-CM

## 2023-07-18 DIAGNOSIS — C50411 Malignant neoplasm of upper-outer quadrant of right female breast: Secondary | ICD-10-CM

## 2023-07-18 DIAGNOSIS — N1831 Chronic kidney disease, stage 3a: Secondary | ICD-10-CM | POA: Diagnosis not present

## 2023-07-18 DIAGNOSIS — E063 Autoimmune thyroiditis: Secondary | ICD-10-CM

## 2023-07-18 DIAGNOSIS — E782 Mixed hyperlipidemia: Secondary | ICD-10-CM

## 2023-07-18 DIAGNOSIS — Z0000 Encounter for general adult medical examination without abnormal findings: Secondary | ICD-10-CM

## 2023-07-18 DIAGNOSIS — E7800 Pure hypercholesterolemia, unspecified: Secondary | ICD-10-CM

## 2023-07-18 DIAGNOSIS — I10 Essential (primary) hypertension: Secondary | ICD-10-CM

## 2023-07-18 LAB — MICROALBUMIN, URINE WAIVED
Creatinine, Urine Waived: 200 mg/dL (ref 10–300)
Microalb, Ur Waived: 30 mg/L — ABNORMAL HIGH (ref 0–19)
Microalb/Creat Ratio: <30 mg/g (ref <30)

## 2023-07-18 MED ORDER — OLMESARTAN-AMLODIPINE-HCTZ 40-5-12.5 MG PO TABS: ORAL_TABLET | ORAL | 4 refills | Status: AC

## 2023-07-18 MED ORDER — ROSUVASTATIN CALCIUM 10 MG PO TABS: 10.0000 mg | ORAL_TABLET | Freq: Every day | ORAL | 4 refills | Status: AC

## 2023-07-18 MED ORDER — LEVOTHYROXINE SODIUM 100 MCG PO TABS
100.0000 ug | ORAL_TABLET | Freq: Every day | ORAL | 3 refills | Status: AC
Start: 2023-07-18 — End: ?

## 2023-07-18 MED ORDER — CALCITRIOL 0.5 MCG PO CAPS: 0.5000 ug | ORAL_CAPSULE | Freq: Two times a day (BID) | ORAL | 4 refills | Status: AC

## 2023-07-18 NOTE — Progress Notes (Signed)
 BP 128/78 (BP Location: Left Arm, Patient Position: Sitting, Cuff Size: Large)   Pulse 98   Temp 97.6 F (36.4 C) (Oral)   Wt 273 lb 6.4 oz (124 kg)   SpO2 98%   BMI 46.20 kg/m    Subjective:    Patient ID: Kerry Phillips, female    DOB: [birth year], 68 y.o.   MRN: [id number]  HPI: Kerry Phillips is a 68 y.o. female presenting on 07/18/2023 for comprehensive medical examination. Current medical complaints include:none  She currently lives with: husband Menopausal Symptoms: no   Following with oncology for right-sided breast cancer.  Had final radiation treatment on 06/06/23.  Last saw oncology on 07/06/23.  Takes Arimidex .  HYPERTENSION & HYPERLIPIDEMIA Continues to take Olmesartan -Amlodipine - HCTZ daily + Rosuvastatin .  Calcium  levels in 8 range at baseline.  Last level 8.7. Hypertension status: controlled  Satisfied with current treatment? yes Duration of hypertension: chronic BP monitoring frequency: rarely BP range: 120-130/80 BP medication side effects:  no Medication compliance: good compliance Aspirin: no Recurrent headaches: no Visual changes: no Palpitations: no Dyspnea: no Chest pain: no Lower extremity edema: no Dizzy/lightheaded: no  The 10-year ASCVD risk score (Arnett DK, et al., 2019) is: 12.5%   Values used to calculate the score:     Age: 65 years     Sex: Female     Is Non-Hispanic African American: Yes     Diabetic: No     Tobacco smoker: No     Systolic Blood Pressure: 128 mmHg     Is BP treated: No     HDL Cholesterol: 63 mg/dL     Total Cholesterol: 258 mg/dL  HYPOTHYROIDISM Continues Levothyroxine  100 MCG daily. Thyroid  control status:stable Satisfied with current treatment? yes Medication side effects: no Medication compliance: good compliance Etiology of hypothyroidism:  Recent dose adjustment:no Fatigue: no Cold intolerance: no Heat intolerance: no Weight gain: no Weight loss: no Constipation: no Diarrhea/loose stools:  no Palpitations: no Lower extremity edema: no Anxiety/depressed mood: no  CHRONIC KIDNEY DISEASE CKD status: stable Medications renally dose: yes Previous renal evaluation: no Pneumovax: refuses Influenza Vaccine: refuses   Depression Screen done today and results listed below:     07/18/2023    9:52 AM 04/27/2023    8:11 AM 01/18/2023   11:34 AM 01/13/2023   10:16 AM 07/13/2022   10:12 AM  Depression screen PHQ 2/9  Decreased Interest 0 0 0 0 0  Down, Depressed, Hopeless 0 0 0 0 0  PHQ - 2 Score 0 0 0 0 0  Altered sleeping 0   0 0  Tired, decreased energy 0   0 0  Change in appetite 0   0 0  Feeling bad or failure about yourself  0   0 0  Trouble concentrating 0   0 0  Moving slowly or fidgety/restless 0   0 0  Suicidal thoughts 0   0 0  PHQ-9 Score 0   0 0  Difficult doing work/chores Not difficult at all   Not difficult at all Not difficult at all      07/18/2023    9:53 AM 01/13/2023   10:16 AM 07/13/2022   10:12 AM 01/06/2022   11:10 AM  GAD 7 : Generalized Anxiety Score  Nervous, Anxious, on Edge 1 0 0 1  Control/stop worrying 0 0 0 1  Worry too much - different things 0 0 0 1  Trouble relaxing 0 0 0 1  Restless 0 0  0 1  Easily annoyed or irritable 0 0 0 1  Afraid - awful might happen 0 0 0 1  Total GAD 7 Score 1 0 0 7  Anxiety Difficulty Not difficult at all Not difficult at all  Not difficult at all   Functional Status Survey: Is the patient deaf or have difficulty hearing?: No Does the patient have difficulty seeing, even when wearing glasses/contacts?: No Does the patient have difficulty concentrating, remembering, or making decisions?: No Does the patient have difficulty walking or climbing stairs?: No Does the patient have difficulty dressing or bathing?: No Does the patient have difficulty doing errands alone such as visiting a doctor's office or shopping?: No      04/11/2022    8:48 AM 07/13/2022   10:12 AM 01/13/2023   10:15 AM 04/27/2023    8:15  AM 07/18/2023    9:52 AM  Fall Risk  Falls in the past year? 0 0 0 0 0  Was there an injury with Fall? 0 0 0 0 0  Fall Risk Category Calculator 0 0 0 0 0  Patient at Risk for Falls Due to No Fall Risks No Fall Risks No Fall Risks No Fall Risks No Fall Risks  Fall risk Follow up Falls prevention discussed;Falls evaluation completed Education provided Falls evaluation completed Falls prevention discussed;Falls evaluation completed Falls evaluation completed    Past Medical History:  Past Medical History:  Diagnosis Date   Arthritis    Breast cancer (HCC)    right   Dysphagia 01/17/2017   History of blood transfusion    with childbirth   Hypertension    Hypothyroidism    surgical    Surgical History:  Past Surgical History:  Procedure Laterality Date   ABDOMINAL HYSTERECTOMY     ARTHROSCOPIC HAGLUNDS REPAIR Right 01/04/2019   Procedure: ENDOSCOPIC RESECTION HAGLUNDS DEFORMITY;  Surgeon: Timothy Ford, MD;  Location: [facility];  Service: Orthopedics;  Laterality: Right;   BREAST BIOPSY Right 01/06/2023   MM RT BREAST BX W LOC DEV 1ST LESION IMAGE BX SPEC STEREO GUIDE 01/06/2023 [facility]   BREAST BIOPSY Right 01/06/2023   MM RT BREAST BX W LOC DEV EA AD LESION IMG BX SPEC STEREO GUIDE 01/06/2023 [facility]   BREAST BIOPSY  03/08/2023   MM RT RADIOACTIVE SEED LOC MAMMO GUIDE 03/08/2023 [facility]   BREAST BIOPSY  03/08/2023   MM RT RADIOACTIVE SEED EA ADD LESION LOC MAMMO GUIDE 03/08/2023 [facility]   BREAST LUMPECTOMY WITH RADIOACTIVE SEED AND SENTINEL LYMPH NODE BIOPSY Right 03/09/2023   Procedure: RIGHT BREAST SEED LOCALIZED LUMPECTOMY BRACKETED WITH RIGHT SENTINEL LYMPH NODE MAPPING;  Surgeon: Sim Dryer, MD;  Location: [facility] OR;  Service: General;  Laterality: Right;   BREAST RECONSTRUCTION Right 03/17/2023   Procedure: RIGHT ONCOPLASTIC BREAST RECONSTRUCTION;  Surgeon: Alger Infield, MD;  Location: [facility];  Service: Plastics;  Laterality:  Right;   BREAST REDUCTION SURGERY Left 03/17/2023   Procedure: LEFT MAMMARY REDUCTION  (BREAST);  Surgeon: Alger Infield, MD;  Location: [facility];  Service: Plastics;  Laterality: Left;   COLONOSCOPY     THYROIDECTOMY     90% removed    Medications:  Current Outpatient Medications on File Prior to Visit  Medication Sig   acetaminophen  (TYLENOL ) 500 MG tablet Take 1,000 mg by mouth every 6 (six) hours as needed for moderate pain (pain score 4-6).   albuterol  (VENTOLIN  HFA) 108 (90 Base) MCG/ACT inhaler Inhale 2 puffs into  the lungs every 6 (six) hours as needed.   anastrozole  (ARIMIDEX ) 1 MG tablet Take 1 tablet (1 mg total) by mouth daily.   cholecalciferol (VITAMIN D3) 25 MCG (1000 UNIT) tablet Take 1,000 Units by mouth daily.   Iron-Vitamins (GERITOL TONIC PO) Take 5 mLs by mouth daily.   No current facility-administered medications on file prior to visit.    Allergies:  No Known Allergies  Social History:  Social History   Socioeconomic History   Marital status: Married    Spouse name: Coy   Number of children: 2   Years of education: Not on file   Highest education level: Not on file  Occupational History   Occupation: self employed  Tobacco Use   Smoking status: Never   Smokeless tobacco: Never  Vaping Use   Vaping status: Never Used  Substance and Sexual Activity   Alcohol use: No   Drug use: Never   Sexual activity: Yes  Other Topics Concern   Not on file  Social History Narrative   Still working-self employed   Social Drivers of Health   Financial Resource Strain: Low Risk  (04/27/2023)   Overall Financial Resource Strain (CARDIA)    Difficulty of Paying Living Expenses: Not hard at all  Food Insecurity: No Food Insecurity (04/27/2023)   Hunger Vital Sign    Worried About Running Out of Food in the Last Year: Never true    Ran Out of Food in the Last Year: Never true  Transportation Needs: No Transportation Needs (04/27/2023)    PRAPARE - Administrator, Civil Service (Medical): No    Lack of Transportation (Non-Medical): No  Physical Activity: Sufficiently Active (04/27/2023)   Exercise Vital Sign    Days of Exercise per Week: 3 days    Minutes of Exercise per Session: 60 min  Stress: No Stress Concern Present (04/27/2023)   [employer] of Occupational Health - Occupational Stress Questionnaire    Feeling of Stress : Not at all  Social Connections: Socially Integrated (04/27/2023)   Social Connection and Isolation Panel [NHANES]    Frequency of Communication with Friends and Family: More than three times a week    Frequency of Social Gatherings with Friends and Family: More than three times a week    Attends Religious Services: More than 4 times per year    Active Member of [employer] or Organizations: Yes    Attends Engineer, structural: More than 4 times per year    Marital Status: Married  Catering manager Violence: Not At Risk (04/27/2023)   Humiliation, Afraid, Rape, and Kick questionnaire    Fear of Current or Ex-Partner: No    Emotionally Abused: No    Physically Abused: No    Sexually Abused: No   Social History   Tobacco Use  Smoking Status Never  Smokeless Tobacco Never   Social History   Substance and Sexual Activity  Alcohol Use No    Family History:  Family History  Problem Relation Age of Onset   Hypertension Mother    Lung cancer Father    Stroke Sister    Hypertension Sister    Lupus Sister    Breast cancer Sister        dx. <50   Hypertension Daughter    Hypertension Son    Breast cancer Maternal Aunt        dx. >50   Breast cancer Other        3 maternal first  cousins once removed    Past medical history, surgical history, medications, allergies, family history and social history reviewed with patient today and changes made to appropriate areas of the chart.   ROS All other ROS negative except what is listed above and in the HPI.      Objective:     BP 128/78 (BP Location: Left Arm, Patient Position: Sitting, Cuff Size: Large)   Pulse 98   Temp 97.6 F (36.4 C) (Oral)   Wt 273 lb 6.4 oz (124 kg)   SpO2 98%   BMI 46.20 kg/m   Wt Readings from Last 3 Encounters:  07/18/23 273 lb 6.4 oz (124 kg)  07/13/23 265 lb 4 oz (120.3 kg)  06/12/23 276 lb 4 oz (125.3 kg)    Physical Exam Vitals and nursing note reviewed. Exam conducted with a chaperone present.  Constitutional:      General: She is awake. She is not in acute distress.    Appearance: She is well-developed and well-groomed. She is obese. She is not ill-appearing or toxic-appearing.  HENT:     Head: Normocephalic and atraumatic.     Right Ear: Hearing, tympanic membrane, ear canal and external ear normal. No drainage.     Left Ear: Hearing, tympanic membrane, ear canal and external ear normal. No drainage.     Nose: Nose normal.     Right Sinus: No maxillary sinus tenderness or frontal sinus tenderness.     Left Sinus: No maxillary sinus tenderness or frontal sinus tenderness.     Mouth/Throat:     Mouth: Mucous membranes are moist.     Pharynx: Oropharynx is clear. Uvula midline. No pharyngeal swelling, oropharyngeal exudate or posterior oropharyngeal erythema.  Eyes:     General: Lids are normal.        Right eye: No discharge.        Left eye: No discharge.     Extraocular Movements: Extraocular movements intact.     Conjunctiva/sclera: Conjunctivae normal.     Pupils: Pupils are equal, round, and reactive to light.     Visual Fields: Right eye visual fields normal and left eye visual fields normal.  Neck:     Thyroid : No thyromegaly.     Vascular: No carotid bruit.     Trachea: Trachea normal.  Cardiovascular:     Rate and Rhythm: Normal rate and regular rhythm.     Heart sounds: Normal heart sounds. No murmur heard.    No gallop.  Pulmonary:     Effort: Pulmonary effort is normal. No accessory muscle usage or respiratory distress.     Breath sounds: Normal  breath sounds.  Chest:  Breasts:    Right: Normal.     Left: Normal.  Abdominal:     General: Bowel sounds are normal.     Palpations: Abdomen is soft. There is no hepatomegaly or splenomegaly.     Tenderness: There is no abdominal tenderness.  Musculoskeletal:        General: Normal range of motion.     Cervical back: Normal range of motion and neck supple.     Right lower leg: No edema.     Left lower leg: No edema.  Lymphadenopathy:     Head:     Right side of head: No submental, submandibular, tonsillar, preauricular or posterior auricular adenopathy.     Left side of head: No submental, submandibular, tonsillar, preauricular or posterior auricular adenopathy.     Cervical: No cervical adenopathy.  Upper Body:     Right upper body: No supraclavicular, axillary or pectoral adenopathy.     Left upper body: No supraclavicular, axillary or pectoral adenopathy.  Skin:    General: Skin is warm and dry.     Capillary Refill: Capillary refill takes less than 2 seconds.     Findings: No rash.  Neurological:     Mental Status: She is alert and oriented to person, place, and time.     Gait: Gait is intact.     Deep Tendon Reflexes: Reflexes are normal and symmetric.     Reflex Scores:      Brachioradialis reflexes are 2+ on the right side and 2+ on the left side.      Patellar reflexes are 2+ on the right side and 2+ on the left side. Psychiatric:        Attention and Perception: Attention normal.        Mood and Affect: Mood normal.        Speech: Speech normal.        Behavior: Behavior normal. Behavior is cooperative.        Thought Content: Thought content normal.        Judgment: Judgment normal.    Results for orders placed or performed in visit on 06/06/23  Rad Onc [facility] Session Summary   Collection Time: 06/06/23 11:11 AM  Result Value Ref Range   Course ID [id number]    Course Start Date 04/11/2023    Session Number 25    Course First Treatment Date 05/03/2023 11:14  AM    Course Last Treatment Date 06/06/2023 11:10 AM    Course Elapsed Days 34    Reference Point ID Breast_R DP    Reference Point Dosage Given to Date 50 Gy   Reference Point Session Dosage Given 2 Gy   Plan ID Breast_R    Plan Fractions Treated to Date 25    Plan Total Fractions Prescribed 25    Plan Prescribed Dose Per Fraction 2 Gy   Plan Total Prescribed Dose 50.000000 Gy   Plan Primary Reference Point Breast_R DP       Assessment & Plan:   Problem List Items Addressed This Visit       Cardiovascular and Mediastinum   Essential hypertension   Chronic, stable.  BP stable in office today on recheck - initial elevated, just lost mother yesterday.  Recommend she monitor BP at least a few mornings a week at home and document.  DASH diet at home.  Continue current medication regimen and adjust as needed.  Urine ALB 150 on check May 2024, will recheck today, continue Olmesartan  for kidney protection.  Labs today: CBC, CMP, urine ALB.  Return in 6 months.       Relevant Medications   Olmesartan -amLODIPine -HCTZ 40-5-12.5 MG TABS   rosuvastatin  (CRESTOR ) 10 MG tablet   Other Relevant Orders   Microalbumin, Urine Waived   CBC with Differential/Platelet   Comprehensive metabolic panel with GFR     Endocrine   Hashimoto's thyroiditis   Chronic, ongoing.  Continue current medication regimen and adjust as needed.  Recheck labs today.      Relevant Medications   levothyroxine  (SYNTHROID ) 100 MCG tablet   Other Relevant Orders   T4, free   TSH     Genitourinary   Stage 3a chronic kidney disease (CKD) (HCC) - Primary   Stage 3a on labs.  Continue Olmesartan  for kidney protection.  Educated her on kidney disease  and risks for this.  Monitor closely.  Increase water intake.  Consider nephrology if worsening.      Relevant Orders   Microalbumin, Urine Waived   Comprehensive metabolic panel with GFR     Other   Vitamin D  deficiency   Ongoing, taking daily supplement, continue  this and check level today.      Relevant Orders   VITAMIN D  25 Hydroxy (Vit-D Deficiency, Fractures)   Morbid obesity (HCC)   BMI 46.20 with HTN and HLD.  Recommended eating smaller high protein, low fat meals more frequently and exercising 30 mins a day 5 times a week with a goal of 10-15lb weight loss in the next 3 months. Patient voiced their understanding and motivation to adhere to these recommendations.       Mixed hyperlipidemia   Chronic, ongoing.  Tolerating Rosuvastatin .  Will continue this and adjust as needed.  Lipid panel today.      Relevant Medications   Olmesartan -amLODIPine -HCTZ 40-5-12.5 MG TABS   rosuvastatin  (CRESTOR ) 10 MG tablet   Other Relevant Orders   Comprehensive metabolic panel with GFR   Lipid Panel w/o Chol/HDL Ratio   Malignant neoplasm of upper-outer quadrant of right breast in female, estrogen receptor positive (HCC)   Following with oncology.  Will continue this collaboration and treatment as ordered by them.  Recent notes reviewed.      Hypocalcemia   Ongoing, continue supplement -- check CMP.      Relevant Medications   calcitRIOL  (ROCALTROL ) 0.5 MCG capsule   Other Relevant Orders   Comprehensive metabolic panel with GFR   Parathyroid hormone, intact (no Ca)   VITAMIN D  25 Hydroxy (Vit-D Deficiency, Fractures)   BMI 45.0-49.9, adult (HCC)   Refer to morbid obesity plan of care.       Other Visit Diagnoses       Encounter for annual physical exam       Annual physical today with labs and health maintenance reviewed, discussed with patient.        Follow up plan: Return in about 6 months (around 01/18/2024) for HTN/HLD, BREAST CA.   LABORATORY TESTING:  - Pap smear: not applicable  IMMUNIZATIONS:   - Tdap: Tetanus vaccination status reviewed: Refuses - Influenza: Refused - Pneumovax: Refuses - Prevnar: Refuses - HPV: Not applicable - Zostavax vaccine: Refused  SCREENING: -Mammogram: Up to date  - Colonoscopy: is  having performed on June 20th - Bone Density: Up to date -- scheduled for repeat on 01/31/2024 -Hearing Test: Not applicable  -Spirometry: Not applicable   PATIENT COUNSELING:   Advised to take 1 mg of folate supplement per day if capable of pregnancy.   Sexuality: Discussed sexually transmitted diseases, partner selection, use of condoms, avoidance of unintended pregnancy  and contraceptive alternatives.   Advised to avoid cigarette smoking.  I discussed with the patient that most people either abstain from alcohol or drink within safe limits (<=14/week and <=4 drinks/occasion for males, <=7/weeks and <= 3 drinks/occasion for females) and that the risk for alcohol disorders and other health effects rises proportionally with the number of drinks per week and how often a drinker exceeds daily limits.  Discussed cessation/primary prevention of drug use and availability of treatment for abuse.   Diet: Encouraged to adjust caloric intake to maintain  or achieve ideal body weight, to reduce intake of dietary saturated fat and total fat, to limit sodium intake by avoiding high sodium foods and not adding table salt, and to maintain adequate  dietary potassium and calcium  preferably from fresh fruits, vegetables, and low-fat dairy products.    Stressed the importance of regular exercise  Injury prevention: Discussed safety belts, safety helmets, smoke detector, smoking near bedding or upholstery.   Dental health: Discussed importance of regular tooth brushing, flossing, and dental visits.    NEXT PREVENTATIVE PHYSICAL DUE IN 1 YEAR. Return in about 6 months (around 01/18/2024) for HTN/HLD, BREAST CA.

## 2023-07-18 NOTE — Assessment & Plan Note (Signed)
 Refer to morbid obesity plan of care.

## 2023-07-18 NOTE — Assessment & Plan Note (Signed)
 BMI 46.20 with HTN and HLD.  Recommended eating smaller high protein, low fat meals more frequently and exercising 30 mins a day 5 times a week with a goal of 10-15lb weight loss in the next 3 months. Patient voiced their understanding and motivation to adhere to these recommendations.

## 2023-07-18 NOTE — Assessment & Plan Note (Signed)
 Following with oncology.  Will continue this collaboration and treatment as ordered by them.  Recent notes reviewed.

## 2023-07-18 NOTE — Assessment & Plan Note (Signed)
 Ongoing, continue supplement -- check CMP.

## 2023-07-18 NOTE — Assessment & Plan Note (Signed)
 Chronic, stable.  BP stable in office today on recheck - initial elevated, just lost mother yesterday.  Recommend she monitor BP at least a few mornings a week at home and document.  DASH diet at home.  Continue current medication regimen and adjust as needed.  Urine ALB 150 on check May 2024, will recheck today, continue Olmesartan  for kidney protection.  Labs today: CBC, CMP, urine ALB.  Return in 6 months.

## 2023-07-18 NOTE — Assessment & Plan Note (Signed)
 Stage 3a on labs.  Continue Olmesartan for kidney protection.  Educated her on kidney disease and risks for this.  Monitor closely.  Increase water intake.  Consider nephrology if worsening.

## 2023-07-18 NOTE — Assessment & Plan Note (Signed)
Ongoing, taking daily supplement, continue this and check level today. 

## 2023-07-18 NOTE — Assessment & Plan Note (Signed)
 Chronic, ongoing.  Continue current medication regimen and adjust as needed.  Recheck labs today.

## 2023-07-18 NOTE — Assessment & Plan Note (Signed)
 Chronic, ongoing.  Tolerating Rosuvastatin .  Will continue this and adjust as needed.  Lipid panel today.

## 2023-07-19 ENCOUNTER — Ambulatory Visit: Payer: Self-pay | Admitting: Nurse Practitioner

## 2023-07-19 NOTE — Progress Notes (Signed)
 Contacted via MyChart   Good afternoon Kerry Phillips, your labs have returned with exception of PTH which is pending: - Kidney function, creatinine and eGFR, shows ongoing Stage 3a kidney disease with no worsening. - Vitamin D  remains a little low, ensure you are taking Vitamin D3 2000 units daily for bone health. - Remainder of labs stable with no medication changes needed, most impressive is your lipid panel. With low dose of Rosuvastatin  daily your LDL (bad cholesterol) has decreased from 178 to 72 and total cholesterol from 258 to 154.  Bravo!!  Any questions?  I will let you know if PTH is abnormal. Keep being amazing!!  Thank you for allowing me to participate in your care.  I appreciate you. Kindest regards, Jadira Nierman

## 2023-07-20 LAB — T4, FREE: Free T4: 1.37 ng/dL (ref 0.82–1.77)

## 2023-07-20 LAB — CBC WITH DIFFERENTIAL/PLATELET
Basophils Absolute: 0 10*3/uL (ref 0.0–0.2)
Basos: 1 %
EOS (ABSOLUTE): 0.1 10*3/uL (ref 0.0–0.4)
Eos: 1 %
Hematocrit: 37.7 % (ref 34.0–46.6)
Hemoglobin: 12.6 g/dL (ref 11.1–15.9)
Immature Grans (Abs): 0 10*3/uL (ref 0.0–0.1)
Immature Granulocytes: 0 %
Lymphocytes Absolute: 1.1 10*3/uL (ref 0.7–3.1)
Lymphs: 19 %
MCH: 28.3 pg (ref 26.6–33.0)
MCHC: 33.4 g/dL (ref 31.5–35.7)
MCV: 85 fL (ref 79–97)
Monocytes Absolute: 0.4 10*3/uL (ref 0.1–0.9)
Monocytes: 7 %
Neutrophils Absolute: 4.1 10*3/uL (ref 1.4–7.0)
Neutrophils: 72 %
Platelets: 317 10*3/uL (ref 150–450)
RBC: 4.45 x10E6/uL (ref 3.77–5.28)
RDW: 14.8 % (ref 11.7–15.4)
WBC: 5.7 10*3/uL (ref 3.4–10.8)

## 2023-07-20 LAB — COMPREHENSIVE METABOLIC PANEL WITH GFR
ALT: 11 IU/L (ref 0–32)
AST: 17 IU/L (ref 0–40)
Albumin: 4.4 g/dL (ref 3.9–4.9)
Alkaline Phosphatase: 53 IU/L (ref 44–121)
BUN/Creatinine Ratio: 20 (ref 12–28)
BUN: 22 mg/dL (ref 8–27)
Bilirubin Total: 0.4 mg/dL (ref 0.0–1.2)
CO2: 25 mmol/L (ref 20–29)
Calcium: 9.2 mg/dL (ref 8.7–10.3)
Chloride: 100 mmol/L (ref 96–106)
Creatinine, Ser: 1.12 mg/dL — ABNORMAL HIGH (ref 0.57–1.00)
Globulin, Total: 2.6 g/dL (ref 1.5–4.5)
Glucose: 74 mg/dL (ref 70–99)
Potassium: 4 mmol/L (ref 3.5–5.2)
Sodium: 142 mmol/L (ref 134–144)
Total Protein: 7 g/dL (ref 6.0–8.5)
eGFR: 54 mL/min/{1.73_m2} — ABNORMAL LOW (ref >59)

## 2023-07-20 LAB — LIPID PANEL W/O CHOL/HDL RATIO
Cholesterol, Total: 154 mg/dL (ref 100–199)
HDL: 70 mg/dL (ref >39)
LDL Chol Calc (NIH): 72 mg/dL (ref 0–99)
Triglycerides: 58 mg/dL (ref 0–149)
VLDL Cholesterol Cal: 12 mg/dL (ref 5–40)

## 2023-07-20 LAB — TSH: TSH: 3.24 u[IU]/mL (ref 0.450–4.500)

## 2023-07-20 LAB — PARATHYROID HORMONE, INTACT (NO CA): PTH: 12 pg/mL — ABNORMAL LOW (ref 15–65)

## 2023-07-20 LAB — VITAMIN D 25 HYDROXY (VIT D DEFICIENCY, FRACTURES): Vit D, 25-Hydroxy: 26.2 ng/mL — ABNORMAL LOW (ref 30.0–100.0)

## 2023-07-20 NOTE — Progress Notes (Signed)
 Contacted via MyChart   Parathyroid level a little low.  Will recheck next visit:)

## 2023-07-27 ENCOUNTER — Telehealth: Payer: Self-pay

## 2023-07-27 NOTE — Telephone Encounter (Signed)
 Return the patient phone call regarding her Disability form. Pt wanted to know if we have received her forms and wanted to know the turn around date. I let her know 10-14 business days and she verbalized understanding. No questions or concerns at this time.

## 2023-07-31 ENCOUNTER — Other Ambulatory Visit: Payer: Self-pay | Admitting: Radiation Oncology

## 2023-08-04 ENCOUNTER — Telehealth: Payer: Self-pay

## 2023-08-04 NOTE — Telephone Encounter (Signed)
 Notified the pt regarding her Cancer Claim being completed, faxed,and confirmation received. Pt hard copy was emailed to her as requested. No questions or concerns at this time.

## 2023-09-13 DIAGNOSIS — Z923 Personal history of irradiation: Secondary | ICD-10-CM | POA: Insufficient documentation

## 2023-09-25 ENCOUNTER — Other Ambulatory Visit: Payer: Self-pay | Admitting: *Deleted

## 2023-09-25 DIAGNOSIS — Z170 Estrogen receptor positive status [ER+]: Secondary | ICD-10-CM

## 2023-09-26 ENCOUNTER — Inpatient Hospital Stay (HOSPITAL_BASED_OUTPATIENT_CLINIC_OR_DEPARTMENT_OTHER): Admitting: Adult Health

## 2023-09-26 ENCOUNTER — Encounter: Payer: Self-pay | Admitting: Adult Health

## 2023-09-26 ENCOUNTER — Inpatient Hospital Stay: Attending: Adult Health

## 2023-09-26 VITALS — BP 126/73 | HR 87 | Temp 98.3°F | Resp 18 | Ht 64.5 in | Wt 278.9 lb

## 2023-09-26 DIAGNOSIS — Z1732 Human epidermal growth factor receptor 2 negative status: Secondary | ICD-10-CM | POA: Diagnosis not present

## 2023-09-26 DIAGNOSIS — R232 Flushing: Secondary | ICD-10-CM | POA: Diagnosis not present

## 2023-09-26 DIAGNOSIS — C50411 Malignant neoplasm of upper-outer quadrant of right female breast: Secondary | ICD-10-CM

## 2023-09-26 DIAGNOSIS — Z801 Family history of malignant neoplasm of trachea, bronchus and lung: Secondary | ICD-10-CM | POA: Insufficient documentation

## 2023-09-26 DIAGNOSIS — Z90710 Acquired absence of both cervix and uterus: Secondary | ICD-10-CM | POA: Insufficient documentation

## 2023-09-26 DIAGNOSIS — Z923 Personal history of irradiation: Secondary | ICD-10-CM | POA: Insufficient documentation

## 2023-09-26 DIAGNOSIS — N644 Mastodynia: Secondary | ICD-10-CM | POA: Diagnosis not present

## 2023-09-26 DIAGNOSIS — Z79811 Long term (current) use of aromatase inhibitors: Secondary | ICD-10-CM | POA: Diagnosis not present

## 2023-09-26 DIAGNOSIS — Z803 Family history of malignant neoplasm of breast: Secondary | ICD-10-CM | POA: Insufficient documentation

## 2023-09-26 DIAGNOSIS — Z1721 Progesterone receptor positive status: Secondary | ICD-10-CM | POA: Diagnosis not present

## 2023-09-26 DIAGNOSIS — Z170 Estrogen receptor positive status [ER+]: Secondary | ICD-10-CM

## 2023-09-26 LAB — CBC WITH DIFFERENTIAL (CANCER CENTER ONLY)
Abs Immature Granulocytes: 0.02 K/uL (ref 0.00–0.07)
Basophils Absolute: 0.1 K/uL (ref 0.0–0.1)
Basophils Relative: 1 %
Eosinophils Absolute: 0.2 K/uL (ref 0.0–0.5)
Eosinophils Relative: 3 %
HCT: 35.6 % — ABNORMAL LOW (ref 36.0–46.0)
Hemoglobin: 11.9 g/dL — ABNORMAL LOW (ref 12.0–15.0)
Immature Granulocytes: 0 %
Lymphocytes Relative: 20 %
Lymphs Abs: 1.2 K/uL (ref 0.7–4.0)
MCH: 28.1 pg (ref 26.0–34.0)
MCHC: 33.4 g/dL (ref 30.0–36.0)
MCV: 84 fL (ref 80.0–100.0)
Monocytes Absolute: 0.5 K/uL (ref 0.1–1.0)
Monocytes Relative: 8 %
Neutro Abs: 4.1 K/uL (ref 1.7–7.7)
Neutrophils Relative %: 68 %
Platelet Count: 295 K/uL (ref 150–400)
RBC: 4.24 MIL/uL (ref 3.87–5.11)
RDW: 13.7 % (ref 11.5–15.5)
WBC Count: 6 K/uL (ref 4.0–10.5)
nRBC: 0 % (ref 0.0–0.2)

## 2023-09-26 LAB — CMP (CANCER CENTER ONLY)
ALT: 16 U/L (ref 0–44)
AST: 15 U/L (ref 15–41)
Albumin: 4.1 g/dL (ref 3.5–5.0)
Alkaline Phosphatase: 42 U/L (ref 38–126)
Anion gap: 5 (ref 5–15)
BUN: 15 mg/dL (ref 8–23)
CO2: 34 mmol/L — ABNORMAL HIGH (ref 22–32)
Calcium: 8.9 mg/dL (ref 8.9–10.3)
Chloride: 100 mmol/L (ref 98–111)
Creatinine: 1.02 mg/dL — ABNORMAL HIGH (ref 0.44–1.00)
GFR, Estimated: 60 mL/min — ABNORMAL LOW (ref >60)
Glucose, Bld: 91 mg/dL (ref 70–99)
Potassium: 3.8 mmol/L (ref 3.5–5.1)
Sodium: 139 mmol/L (ref 135–145)
Total Bilirubin: 0.5 mg/dL (ref 0.0–1.2)
Total Protein: 7.2 g/dL (ref 6.5–8.1)

## 2023-09-26 NOTE — Progress Notes (Signed)
 SURVIVORSHIP VISIT:  BRIEF ONCOLOGIC HISTORY:  Oncology History  Malignant neoplasm of upper-outer quadrant of right breast in female, estrogen receptor positive (HCC)  12/14/2022 Mammogram   In the right breast, calcifications warranting further evaluation with magnified views.  In the left breast no finding suspicious for malignancy.  Ultrasound of the right axilla is performed showing normal lymph nodes.   01/06/2023 Pathology Results   Right breast needle core biopsy upper outer quadrant showed focal atypical ductal hyperplasia, fibroadenomatoid change with calcifications. Right breast needle core biopsy upper outer quadrant anterior aspect showed invasive ductal carcinoma, overall grade 1, DCIS low to intermediate nuclear grade.  Invasive cancer showed ER 95% positive strong staining, PR 20% positive strong staining, Ki-67 of 2% and HER2 1+   01/16/2023 Initial Diagnosis   Malignant neoplasm of upper-outer quadrant of right breast in female, estrogen receptor positive (HCC)   01/18/2023 Cancer Staging   Staging form: Breast, AJCC 8th Edition - Clinical stage from 01/18/2023: Stage IA (cT1a, cN0, cM0, G1, ER+, PR+, HER2-) - Signed by Loretha Ash, MD on 01/18/2023 Stage prefix: Initial diagnosis Histologic grading system: 3 grade system Laterality: Right Staged by: Pathologist and managing physician Stage used in treatment planning: Yes National guidelines used in treatment planning: Yes Type of national guideline used in treatment planning: NCCN    Genetic Testing   [facility] CancerNext-Expanded Panel+RNA was Negative. 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 Report date is 02/16/2023.  The CancerNext-Expanded gene panel offered by [facility] and includes sequencing, rearrangement, and RNA analysis for the following 76 genes: AIP, ALK, APC, ATM, AXIN2, BAP1, BARD1, BMPR1A, BRCA1, BRCA2, BRIP1, CDC73, CDH1, CDK4, CDKN1B, CDKN2A, CEBPA,  CHEK2, CTNNA1, DDX41, DICER1, ETV6, FH, FLCN, GATA2, LZTR1, MAX, MBD4, MEN1, MET, MLH1, MSH2, MSH3, MSH6, MUTYH, NF1, NF2, NTHL1, PALB2, PHOX2B, PMS2, POT1, PRKAR1A, PTCH1, PTEN, RAD51C, RAD51D, RB1, RET, RUNX1, SDHA, SDHAF2, SDHB, SDHC, SDHD, SMAD4, SMARCA4, SMARCB1, SMARCE1, STK11, SUFU, TMEM127, TP53, TSC1, TSC2, VHL, and WT1 (sequencing and deletion/duplication); EGFR, HOXB13, KIT, MITF, PDGFRA, POLD1, and POLE (sequencing only); EPCAM and GREM1 (deletion/duplication only).     04/11/2023 Cancer Staging   Staging form: Breast, AJCC 8th Edition - Pathologic stage from 04/11/2023: Stage IA (pT1c, pN0, cM0, G1, ER+, PR+, HER2-) - Signed by Wyatt Leeroy HERO, PA-C on 04/11/2023 Stage prefix: Initial diagnosis Method of lymph node assessment: Clinical Multigene prognostic tests performed: Other Histologic grading system: 3 grade system   05/03/2023 - 06/06/2023 Radiation Therapy   Plan Name: Breast_R Site: Breast, Right Technique: 3D Mode: Photon Dose Per Fraction: 2 Gy Prescribed Dose (Delivered / Prescribed): 50 Gy / 50 Gy Prescribed Fxs (Delivered / Prescribed): 25 / 25   06/2023 -  Anti-estrogen oral therapy   Anastrozole      INTERVAL HISTORY:  Kerry Phillips to review her survivorship care plan detailing her treatment course for breast cancer, as well as monitoring long-term side effects of that treatment, education regarding health maintenance, screening, and overall wellness and health promotion.     Discussed the use of AI scribe software for clinical note transcription with the patient, who gave verbal consent to proceed.  History of Present Illness Kerry Phillips is a 68 year old female with stage 1A invasive breast cancer who presents for a survivorship visit.  She has completed treatment for stage 1A right-sided invasive breast cancer, which was estrogen and progesterone receptor positive. Her treatment included a lumpectomy, breast reduction, and radiation therapy. Seven lymph nodes  were  removed and were negative for cancer. She is currently on anastrozole  without issues.  She experiences daily hot flashes, managed with a fan at night, and occasionally feels 'twinges' in the breast post-surgery. Genetic testing was negative for mutations.  She maintains a healthy lifestyle with a focus on diet and exercise, and emphasizes hydration by drinking at least six bottles of water daily.  She has had a bone density test this year and is scheduled for another in two years. Her mammogram is due in October. She is up to date with her pap smear and is aware of the need for regular colon cancer screening and skin checks.    REVIEW OF SYSTEMS:  Review of Systems  Constitutional:  Negative for appetite change, chills, fatigue, fever and unexpected weight change.  HENT:   Negative for hearing loss, lump/mass and trouble swallowing.   Eyes:  Negative for eye problems and icterus.  Respiratory:  Negative for chest tightness, cough and shortness of breath.   Cardiovascular:  Negative for chest pain, leg swelling and palpitations.  Gastrointestinal:  Negative for abdominal distention, abdominal pain, constipation, diarrhea, nausea and vomiting.  Endocrine: Positive for hot flashes.  Genitourinary:  Negative for difficulty urinating.   Musculoskeletal:  Negative for arthralgias.  Skin:  Negative for itching and rash.  Neurological:  Negative for dizziness, extremity weakness, headaches and numbness.  Hematological:  Negative for adenopathy. Does not bruise/bleed easily.  Psychiatric/Behavioral:  Negative for depression. The patient is not nervous/anxious.    Breast: Denies any new nodularity, masses, tenderness, nipple changes, or nipple discharge.       PAST MEDICAL/SURGICAL HISTORY:  Past Medical History:  Diagnosis Date   Arthritis    Breast cancer (HCC)    right   Dysphagia 01/17/2017   History of blood transfusion    with childbirth   Hypertension    Hypothyroidism     surgical   Past Surgical History:  Procedure Laterality Date   ABDOMINAL HYSTERECTOMY     ARTHROSCOPIC HAGLUNDS REPAIR Right 01/04/2019   Procedure: ENDOSCOPIC RESECTION HAGLUNDS DEFORMITY;  Surgeon: Harden Jerona GAILS, MD;  Location: [facility];  Service: Orthopedics;  Laterality: Right;   BREAST BIOPSY Right 01/06/2023   MM RT BREAST BX W LOC DEV 1ST LESION IMAGE BX SPEC STEREO GUIDE 01/06/2023 [facility]   BREAST BIOPSY Right 01/06/2023   MM RT BREAST BX W LOC DEV EA AD LESION IMG BX SPEC STEREO GUIDE 01/06/2023 [facility]   BREAST BIOPSY  03/08/2023   MM RT RADIOACTIVE SEED LOC MAMMO GUIDE 03/08/2023 [facility]   BREAST BIOPSY  03/08/2023   MM RT RADIOACTIVE SEED EA ADD LESION LOC MAMMO GUIDE 03/08/2023 [facility]   BREAST LUMPECTOMY WITH RADIOACTIVE SEED AND SENTINEL LYMPH NODE BIOPSY Right 03/09/2023   Procedure: RIGHT BREAST SEED LOCALIZED LUMPECTOMY BRACKETED WITH RIGHT SENTINEL LYMPH NODE MAPPING;  Surgeon: Vanderbilt Ned, MD;  Location: [facility] OR;  Service: General;  Laterality: Right;   BREAST RECONSTRUCTION Right 03/17/2023   Procedure: RIGHT ONCOPLASTIC BREAST RECONSTRUCTION;  Surgeon: Arelia Filippo, MD;  Location: [facility];  Service: Plastics;  Laterality: Right;   BREAST REDUCTION SURGERY Left 03/17/2023   Procedure: LEFT MAMMARY REDUCTION  (BREAST);  Surgeon: Arelia Filippo, MD;  Location: [facility];  Service: Plastics;  Laterality: Left;   COLONOSCOPY     THYROIDECTOMY     90% removed     ALLERGIES:  No Known Allergies   CURRENT MEDICATIONS:  Outpatient Encounter Medications  as of 09/26/2023  Medication Sig   acetaminophen  (TYLENOL ) 500 MG tablet Take 1,000 mg by mouth every 6 (six) hours as needed for moderate pain (pain score 4-6).   albuterol  (VENTOLIN  HFA) 108 (90 Base) MCG/ACT inhaler Inhale 2 puffs into the lungs every 6 (six) hours as needed.   anastrozole  (ARIMIDEX ) 1 MG tablet Take 1 tablet (1 mg total) by mouth  daily.   calcitRIOL  (ROCALTROL ) 0.5 MCG capsule Take 1 capsule (0.5 mcg total) by mouth 2 (two) times daily.   cholecalciferol (VITAMIN D3) 25 MCG (1000 UNIT) tablet Take 1,000 Units by mouth daily.   Iron-Vitamins (GERITOL TONIC PO) Take 5 mLs by mouth daily.   levothyroxine  (SYNTHROID ) 100 MCG tablet Take 1 tablet (100 mcg total) by mouth daily.   Olmesartan -amLODIPine -HCTZ 40-5-12.5 MG TABS TAKE 1 TABLET BY MOUTH ONCE DAILY   rosuvastatin  (CRESTOR ) 10 MG tablet Take 1 tablet (10 mg total) by mouth daily.   No facility-administered encounter medications on file as of 09/26/2023.     ONCOLOGIC FAMILY HISTORY:  Family History  Problem Relation Age of Onset   Hypertension Mother    Lung cancer Father    Stroke Sister    Hypertension Sister    Lupus Sister    Breast cancer Sister        dx. <50   Hypertension Daughter    Hypertension Son    Breast cancer Maternal Aunt        dx. >50   Breast cancer Other        3 maternal first cousins once removed     SOCIAL HISTORY:  Social History   Socioeconomic History   Marital status: Married    Spouse name: Coy   Number of children: 2   Years of education: Not on file   Highest education level: Not on file  Occupational History   Occupation: self employed  Tobacco Use   Smoking status: Never   Smokeless tobacco: Never  Vaping Use   Vaping status: Never Used  Substance and Sexual Activity   Alcohol use: No   Drug use: Never   Sexual activity: Yes  Other Topics Concern   Not on file  Social History Narrative   Still working-self employed   Social Drivers of Health   Financial Resource Strain: Low Risk  (04/27/2023)   Overall Financial Resource Strain (CARDIA)    Difficulty of Paying Living Expenses: Not hard at all  Food Insecurity: No Food Insecurity (04/27/2023)   Hunger Vital Sign    Worried About Running Out of Food in the Last Year: Never true    Ran Out of Food in the Last Year: Never true  Transportation Needs:  No Transportation Needs (04/27/2023)   PRAPARE - Administrator, Civil Service (Medical): No    Lack of Transportation (Non-Medical): No  Physical Activity: Sufficiently Active (04/27/2023)   Exercise Vital Sign    Days of Exercise per Week: 3 days    Minutes of Exercise per Session: 60 min  Stress: No Stress Concern Present (04/27/2023)   [employer] of Occupational Health - Occupational Stress Questionnaire    Feeling of Stress : Not at all  Social Connections: Socially Integrated (04/27/2023)   Social Connection and Isolation Panel    Frequency of Communication with Friends and Family: More than three times a week    Frequency of Social Gatherings with Friends and Family: More than three times a week    Attends Religious Services: More  than 4 times per year    Active Member of Clubs or Organizations: Yes    Attends Banker Meetings: More than 4 times per year    Marital Status: Married  Catering Manager Violence: Not At Risk (04/27/2023)   Humiliation, Afraid, Rape, and Kick questionnaire    Fear of Current or Ex-Partner: No    Emotionally Abused: No    Physically Abused: No    Sexually Abused: No     OBSERVATIONS/OBJECTIVE:  BP 126/73 (BP Location: Left Arm, Patient Position: Sitting)   Pulse 87   Temp 98.3 F (36.8 C) (Temporal)   Resp 18   Ht 5' 4.5 (1.638 m)   Wt 278 lb 14.4 oz (126.5 kg)   SpO2 100%   BMI 47.13 kg/m  GENERAL: Patient is a well appearing female in no acute distress HEENT:  Sclerae anicteric.  Oropharynx clear and moist. No ulcerations or evidence of oropharyngeal candidiasis. Neck is supple.  NODES:  No cervical, supraclavicular, or axillary lymphadenopathy palpated.  BREAST EXAM:  Right breast s/p lumpectomy and radiation, no sign of local recurrence, left breast benign LUNGS:  Clear to auscultation bilaterally.  No wheezes or rhonchi. HEART:  Regular rate and rhythm. No murmur appreciated. ABDOMEN:  Soft, nontender.   Positive, normoactive bowel sounds. No organomegaly palpated. MSK:  No focal spinal tenderness to palpation. Full range of motion bilaterally in the upper extremities. EXTREMITIES:  No peripheral edema.   SKIN:  Clear with no obvious rashes or skin changes. No nail dyscrasia. NEURO:  Nonfocal. Well oriented.  Appropriate affect.   LABORATORY DATA:  None for this visit.  DIAGNOSTIC IMAGING:  None for this visit.      ASSESSMENT AND PLAN:  Ms.. Phillips is a pleasant 68 y.o. female with Stage IA right breast invasive ductal carcinoma, ER+/PR+/HER2-, diagnosed in 01/2023, treated with lumpectomy, adjuvant radiation therapy, and anti-estrogen therapy with Anastrozole  beginning in 06/2023.  She presents to the Survivorship Clinic for our initial meeting and routine follow-up post-completion of treatment for breast cancer.    1. Stage IA right breast cancer:  Kerry Phillips is continuing to recover from definitive treatment for breast cancer. She will follow-up with her medical oncologist, Dr.Iruku in 6 months with history and physical exam per surveillance protocol.  She will continue her anti-estrogen therapy with Anastrozole . Thus far, she is tolerating the Anastrozole  well, with minimal side effects. Her mammogram is due 11/2023; orders placed today.   She will undergo Guardant reveal testing every 6 months.    Today, a comprehensive survivorship care plan and treatment summary was reviewed with the patient today detailing her breast cancer diagnosis, treatment course, potential late/long-term effects of treatment, appropriate follow-up care with recommendations for the future, and patient education resources.  A copy of this summary, along with a letter will be sent to the patients primary care provider via mail/fax/In Basket message after todays visit.    2. Hot flashes secondary to anastrozole  therapy Experiencing daily hot flashes, manageable with fan use. - Continue current management with fan  use.  3. Post-surgical breast pain/twinges Occasional twinges post-lumpectomy and reduction, common among survivors and tends to decrease over time. - Monitor for changes in pain frequency or severity.  4.  Risk of lymphedema post-right axillary lymph node dissection Potential risk for lymphedema with swelling in the arm. - Monitor for signs of lymphedema. - Attend rehabilitation as needed.  5. Bone health:  Given Kerry Phillips's age/history of breast cancer and her current  treatment regimen including anti-estrogen therapy with Anastrozole , she is at risk for bone demineralization.  Her last DEXA scan occurred in 05/2020 and was normal. Repeat testing scheduled in 01/2024. She was given education on specific activities to promote bone health.  6. Cancer screening:  Due to Kerry Phillips's history and her age, she should receive screening for skin cancers, colon cancer, and gynecologic cancers.  The information and recommendations are listed on the patient's comprehensive care plan/treatment summary and were reviewed in detail with the patient.    7. Health maintenance and wellness promotion: Kerry Phillips was encouraged to consume 5-7 servings of fruits and vegetables per day. We reviewed the Nutrition Rainbow handout.  She was also encouraged to engage in moderate to vigorous exercise for 30 minutes per day most days of the week.  She was instructed to limit her alcohol consumption and continue to abstain from tobacco use.     8. Support services/counseling: It is not uncommon for this period of the patient's cancer care trajectory to be one of many emotions and stressors.   She was given information regarding our available services and encouraged to contact me with any questions or for help enrolling in any of our support group/programs.    Follow up instructions:    -Return to cancer center in 6 months for f/u with Dr. Loretha  -Mammogram due in 11/2023 - Guardant Reveal testing every 6 months -DEXA in  01/2024 -She is welcome to return back to the Survivorship Clinic at any time; no additional follow-up needed at this time.  -Consider referral back to survivorship as a long-term survivor for continued surveillance  The patient was provided an opportunity to ask questions and all were answered. The patient agreed with the plan and demonstrated an understanding of the instructions.   Total encounter time:45 minutes*in face-to-face visit time, chart review, lab review, care coordination, order entry, and documentation of the encounter time.    Morna Kendall, NP 09/26/23 10:50 AM Medical Oncology and Hematology [facility] [address] [place], [place] [address] Tel. [phone]    Fax. [phone]  *Total Encounter Time as defined by the Centers for Medicare and Medicaid Services includes, in addition to the face-to-face time of a patient visit (documented in the note above) non-face-to-face time: obtaining and reviewing outside history, ordering and reviewing medications, tests or procedures, care coordination (communications with other health care professionals or caregivers) and documentation in the medical record.

## 2023-09-27 ENCOUNTER — Telehealth: Payer: Self-pay

## 2023-09-27 NOTE — Telephone Encounter (Signed)
 [facility] Clinical Social Work  Copywriter, advertising (CSW) received voicemail from patient inquiring about financial assistance. CSW returned patient's call on this date to assess further.  Patient denied any immediate SDOH needs. Patient completed radiation treatment in April of 2025.     CSW assessed patient for eligibility for financial assistance.  Patient does not meet eligibility for the [employer] due to patient not being in active treatment and not meeting presumptive eligibility requirements. CSW informed patient active treatment requirement applies to most organizational grants. Therefore, patient would not meet eligibility requirements.    Follow Up Covering CSW will notify primary CSW of message.    Lizbeth Sprague, LCSW Clinical Social Worker [facility]

## 2023-10-02 ENCOUNTER — Inpatient Hospital Stay: Admitting: Adult Health

## 2023-10-03 ENCOUNTER — Telehealth: Payer: Self-pay | Admitting: Licensed Clinical Social Worker

## 2023-10-03 NOTE — Telephone Encounter (Signed)
 [facility] Clinical Social Work  TC to pt to follow-up on resource questions per CSW N. Abdul. Pt answered but unable to speak and stated she will call back.  As pt is out of treatment, no cancer foundations are available for assistance.   Josey Forcier E Aniyah Nobis, LCSW

## 2023-10-11 ENCOUNTER — Encounter: Payer: Self-pay | Admitting: Adult Health

## 2023-10-23 ENCOUNTER — Ambulatory Visit: Attending: Surgery

## 2023-10-23 VITALS — Wt 278.2 lb

## 2023-10-23 DIAGNOSIS — Z483 Aftercare following surgery for neoplasm: Secondary | ICD-10-CM | POA: Insufficient documentation

## 2023-10-23 DIAGNOSIS — I890 Lymphedema, not elsewhere classified: Secondary | ICD-10-CM

## 2023-10-23 NOTE — Therapy (Addendum)
 " OUTPATIENT PHYSICAL THERAPY SOZO SCREENING NOTE   Patient Name: Kerry Phillips MRN: [id number] DOB:1955-12-10, 68 y.o., female Today's Date: 02/19/2024  PCP: Valerio Melanie DASEN, NP REFERRING PROVIDER: Vanderbilt Ned, MD     Past Medical History:  Diagnosis Date   Arthritis    Breast cancer [facility])    right   Dysphagia 01/17/2017   History of blood transfusion    with childbirth   Hypertension    Hypothyroidism    surgical   Past Surgical History:  Procedure Laterality Date   ABDOMINAL HYSTERECTOMY     ARTHROSCOPIC HAGLUNDS REPAIR Right 01/04/2019   Procedure: ENDOSCOPIC RESECTION HAGLUNDS DEFORMITY;  Surgeon: Harden Jerona GAILS, MD;  Location: [facility] OR;  Service: Orthopedics;  Laterality: Right;   BREAST BIOPSY Right 01/06/2023   MM RT BREAST BX W LOC DEV 1ST LESION IMAGE BX SPEC STEREO GUIDE 01/06/2023 [facility]MO[facility]   BREAST BIOPSY Right 01/06/2023   MM RT BREAST BX W LOC DEV EA AD LESION IMG BX SPEC STEREO GUIDE 01/06/2023 [facility]MO[facility]   BREAST BIOPSY  03/08/2023   MM RT RADIOACTIVE SEED LOC MAMMO GUIDE 03/08/2023 [facility]   BREAST BIOPSY  03/08/2023   MM RT RADIOACTIVE SEED EA ADD LESION LOC MAMMO GUIDE 03/08/2023 [facility]   BREAST LUMPECTOMY WITH RADIOACTIVE SEED AND SENTINEL LYMPH NODE BIOPSY Right 03/09/2023   Procedure: RIGHT BREAST SEED LOCALIZED LUMPECTOMY BRACKETED WITH RIGHT SENTINEL LYMPH NODE MAPPING;  Surgeon: Vanderbilt Ned, MD;  Location: [facility] OR;  Service: General;  Laterality: Right;   BREAST RECONSTRUCTION Right 03/17/2023   Procedure: RIGHT ONCOPLASTIC BREAST RECONSTRUCTION;  Surgeon: Arelia Filippo, MD;  Location: [facility];  Service: Plastics;  Laterality: Right;   BREAST REDUCTION SURGERY Left 03/17/2023   Procedure: LEFT MAMMARY REDUCTION  (BREAST);  Surgeon: Arelia Filippo, MD;  Location: [facility];  Service: Plastics;  Laterality: Left;   COLONOSCOPY     THYROIDECTOMY     90% removed   Patient Active  Problem List   Diagnosis Date Noted   History of therapeutic radiation 09/13/2023   Gastroesophageal reflux disease 01/18/2023   History of colonic polyps 01/18/2023   Malignant neoplasm of upper-outer quadrant of right breast in female, estrogen receptor positive (HCC) 01/16/2023   No vaccination-pt refuse 01/08/2023   Exercise-induced asthma 01/06/2022   Stage 3a chronic kidney disease (CKD) (HCC) 07/25/2021   Mixed hyperlipidemia 10/15/2020   Vitamin D  deficiency 10/15/2020   Morbid obesity (HCC) 03/22/2018   Hypocalcemia 09/07/2017   BMI 45.0-49.9, adult (HCC) 04/05/2016   Arthritis of both knees 04/05/2016   Essential hypertension 12/22/2014   Hashimoto's thyroiditis 12/22/2014    REFERRING DIAG: right breast cancer at risk for lymphedema  THERAPY DIAG:  Aftercare following surgery for neoplasm  Lymphedema, not elsewhere classified  PERTINENT HISTORY:  Patient was diagnosed on 12/14/2022 with right grade I invasive ductal carcinoma breast cancer. It measures 15 cm and is located in the upper outer quadrant. It is ER/PR positive and HER2 negative with a Ki67 of 2%. 03/09/23- R lumpectomy and SLNB 0/7, 03/17/23- R oncoplastic reconstruction and L reduction  PRECAUTIONS: right UE Lymphedema risk, None  SUBJECTIVE: Pt returns for her 1 month L-Dex follow up after having a high change from baseline. I've been wearing my sleeve most days, not quite the 10 hrs though since it was good last time.  PAIN:  Are you having pain? No  SOZO SCREENING:  Patient was assessed today using the SOZO machine to determine  the lymphedema index score. This was compared to her baseline score. It was determined that she is NOT within the recommended range when compared to her baseline. She has a compression sleeve and wanted to order a second. Pulled up her original [place] ordered and helped her place this before she left today It is recommended she return in 1 month to be reassessed. If she continues  to measure outside the recommended range, physical therapy treatment will be recommended at that time and a referral requested.   Baseline -4.5, current 4.4, change from baseline is 8.9 indicating subclinical lymphedema.   P: Pt can return to 3 month L-Dex screens.    Aden Berwyn Caldron, PTA 10/23/23  Larue Numbers, PT 10/23/23     "

## 2023-11-27 ENCOUNTER — Ambulatory Visit: Attending: Surgery | Admitting: Rehabilitation

## 2023-11-27 ENCOUNTER — Encounter: Payer: Self-pay | Admitting: Rehabilitation

## 2023-11-27 DIAGNOSIS — C50411 Malignant neoplasm of upper-outer quadrant of right female breast: Secondary | ICD-10-CM | POA: Insufficient documentation

## 2023-11-27 DIAGNOSIS — Z483 Aftercare following surgery for neoplasm: Secondary | ICD-10-CM | POA: Insufficient documentation

## 2023-11-27 DIAGNOSIS — Z170 Estrogen receptor positive status [ER+]: Secondary | ICD-10-CM | POA: Insufficient documentation

## 2023-11-27 NOTE — Therapy (Signed)
 OUTPATIENT PHYSICAL THERAPY SOZO SCREENING NOTE   Patient Name: Kerry Phillips MRN: [id number] DOB:1955/04/14, 68 y.o., female Today's Date: 11/27/2023  PCP: Valerio Melanie DASEN, NP REFERRING PROVIDER: Vanderbilt Ned, MD   PT End of Session - 11/27/23 1121     Visit Number 2   screen only   PT Start Time 1106    PT Stop Time 1110    PT Time Calculation (min) 4 min    Activity Tolerance Patient tolerated treatment well    Behavior During Therapy [facility] for tasks assessed/performed          Past Medical History:  Diagnosis Date   Arthritis    Breast cancer (HCC)    right   Dysphagia 01/17/2017   History of blood transfusion    with childbirth   Hypertension    Hypothyroidism    surgical   Past Surgical History:  Procedure Laterality Date   ABDOMINAL HYSTERECTOMY     ARTHROSCOPIC HAGLUNDS REPAIR Right 01/04/2019   Procedure: ENDOSCOPIC RESECTION HAGLUNDS DEFORMITY;  Surgeon: Harden Jerona GAILS, MD;  Location: [facility] OR;  Service: Orthopedics;  Laterality: Right;   BREAST BIOPSY Right 01/06/2023   MM RT BREAST BX W LOC DEV 1ST LESION IMAGE BX SPEC STEREO GUIDE 01/06/2023 [facility]   BREAST BIOPSY Right 01/06/2023   MM RT BREAST BX W LOC DEV EA AD LESION IMG BX SPEC STEREO GUIDE 01/06/2023 [facility]   BREAST BIOPSY  03/08/2023   MM RT RADIOACTIVE SEED LOC MAMMO GUIDE 03/08/2023 [facility]   BREAST BIOPSY  03/08/2023   MM RT RADIOACTIVE SEED EA ADD LESION LOC MAMMO GUIDE 03/08/2023 [facility]   BREAST LUMPECTOMY WITH RADIOACTIVE SEED AND SENTINEL LYMPH NODE BIOPSY Right 03/09/2023   Procedure: RIGHT BREAST SEED LOCALIZED LUMPECTOMY BRACKETED WITH RIGHT SENTINEL LYMPH NODE MAPPING;  Surgeon: Vanderbilt Ned, MD;  Location: [facility] OR;  Service: General;  Laterality: Right;   BREAST RECONSTRUCTION Right 03/17/2023   Procedure: RIGHT ONCOPLASTIC BREAST RECONSTRUCTION;  Surgeon: Arelia Filippo, MD;  Location: [facility];  Service: Plastics;  Laterality:  Right;   BREAST REDUCTION SURGERY Left 03/17/2023   Procedure: LEFT MAMMARY REDUCTION  (BREAST);  Surgeon: Arelia Filippo, MD;  Location: [facility];  Service: Plastics;  Laterality: Left;   COLONOSCOPY     THYROIDECTOMY     90% removed   Patient Active Problem List   Diagnosis Date Noted   Gastroesophageal reflux disease 01/18/2023   History of colonic polyps 01/18/2023   Malignant neoplasm of upper-outer quadrant of right breast in female, estrogen receptor positive (HCC) 01/16/2023   No vaccination-pt refuse 01/08/2023   Exercise-induced asthma 01/06/2022   Stage 3a chronic kidney disease (CKD) (HCC) 07/25/2021   Mixed hyperlipidemia 10/15/2020   Vitamin D  deficiency 10/15/2020   Morbid obesity (HCC) 03/22/2018   Hypocalcemia 09/07/2017   BMI 45.0-49.9, adult (HCC) 04/05/2016   Arthritis of both knees 04/05/2016   Essential hypertension 12/22/2014   Hashimoto's thyroiditis 12/22/2014    REFERRING DIAG: right breast cancer at risk for lymphedema  THERAPY DIAG:  Aftercare following surgery for neoplasm  Malignant neoplasm of upper-outer quadrant of right breast in female, estrogen receptor positive (HCC)  PERTINENT HISTORY:  Patient was diagnosed on 12/14/2022 with right grade I invasive ductal carcinoma breast cancer. It measures 15 cm and is located in the upper outer quadrant. It is ER/PR positive and HER2 negative with a Ki67 of 2%. 03/09/23- R lumpectomy and SLNB 0/7, 03/17/23- R oncoplastic reconstruction and  L reduction  PRECAUTIONS: right UE Lymphedema risk, None  SUBJECTIVE: Pt returns for her 1 month L-Dex follow up after having a high change from baseline. I've been wearing my sleeve most days, not quite the 10 hrs though since it was good last time.  PAIN:  Are you having pain? No  SOZO SCREENING:  Patient was assessed today using the SOZO machine to determine the lymphedema index score. This was compared to her baseline score. It was determined  that she is now within the recommended range when compared to her baseline. She has a compression sleeve and wanted to order a second. Pulled up her original [employer] ordered and helped her place this before she left today It is recommended she return in 1 month to be reassessed. If she continues to measure outside the recommended range, physical therapy treatment will be recommended at that time and a referral requested.   L-DEX FLOWSHEETS - 11/27/23 1100       L-DEX LYMPHEDEMA SCREENING   Measurement Type Unilateral    L-DEX MEASUREMENT EXTREMITY Upper Extremity    POSITION  Standing    DOMINANT SIDE Right    At Risk Side Right    BASELINE SCORE (UNILATERAL) -4.5    L-DEX SCORE (UNILATERAL) -11.5    VALUE CHANGE (UNILAT) -7    Comment Pt will wear sleeve as needed now and during high activity and flying         P: Pt can return to 3 month L-Dex screens.    Larue Saddie SAUNDERS, PT 11/27/2023, 11:23 AM

## 2023-12-07 ENCOUNTER — Other Ambulatory Visit: Payer: Self-pay | Admitting: Nurse Practitioner

## 2023-12-07 NOTE — Telephone Encounter (Signed)
 Copied from CRM [id number]. Topic: Clinical - Medication Refill >> Dec 07, 2023  8:21 AM Larissa S wrote: Medication: levothyroxine  (SYNTHROID ) 100 MCG tablet  Has the patient contacted their pharmacy? Yes (Agent: If no, request that the patient contact the pharmacy for the refill. If patient does not wish to contact the pharmacy document the reason why and proceed with request.) (Agent: If yes, when and what did the pharmacy advise?)  This is the patient's preferred pharmacy:  [id number] [place], [place] - [address] [place] [address] Phone: [phone] Fax: [phone]  Is this the correct pharmacy for this prescription? Yes If no, delete pharmacy and type the correct one.   Has the prescription been filled recently? Yes  Is the patient out of the medication? Yes  Has the patient been seen for an appointment in the last year OR does the patient have an upcoming appointment? Yes  Can we respond through MyChart? No  Agent: Please be advised that Rx refills may take up to 3 business days. We ask that you follow-up with your pharmacy.

## 2023-12-07 NOTE — Telephone Encounter (Signed)
 Patient requesting another refill for levothyroxine  (SYNTHROID ) 100 MCG tablet, due to medication accidentally being discarded. Medication not due for refill. Please advise patient.

## 2023-12-15 ENCOUNTER — Encounter

## 2023-12-15 ENCOUNTER — Ambulatory Visit: Payer: Self-pay | Admitting: Nurse Practitioner

## 2023-12-15 ENCOUNTER — Ambulatory Visit
Admission: RE | Admit: 2023-12-15 | Discharge: 2023-12-15 | Disposition: A | Source: Ambulatory Visit | Attending: Adult Health | Admitting: Adult Health

## 2023-12-15 ENCOUNTER — Telehealth: Payer: Self-pay | Admitting: Nurse Practitioner

## 2023-12-15 DIAGNOSIS — C50411 Malignant neoplasm of upper-outer quadrant of right female breast: Secondary | ICD-10-CM

## 2023-12-15 MED ORDER — LEVOTHYROXINE SODIUM 100 MCG PO TABS: 100.0000 ug | ORAL_TABLET | Freq: Every day | ORAL | 3 refills | Status: DC

## 2023-12-15 MED ORDER — LEVOTHYROXINE SODIUM 100 MCG PO TABS: 100.0000 ug | ORAL_TABLET | Freq: Every day | ORAL | 3 refills | Status: AC

## 2023-12-15 NOTE — Telephone Encounter (Signed)
 Copied from CRM [id number]. Topic: Clinical - Prescription Issue >> Dec 15, 2023  9:29 AM Tiffini S wrote: Reason for CRM: Patient called about levothyroxine  (SYNTHROID ) 100 MCG tablet- patient was putting together and accidentally trashed the medicine- last tablet was taken on 12/06/2023- completely out   [id number]  [address]  [place] [address]  Please call the patient [phone] and leave a detail message/ sometimes calls come in a SPAM

## 2023-12-15 NOTE — Progress Notes (Signed)
 Contacted via MyChart   Normal mammogram, may repeat in one year:)

## 2023-12-15 NOTE — Telephone Encounter (Signed)
 Can we send in a new script by chance? Patient may have to pay out of pocket.

## 2024-01-14 NOTE — Patient Instructions (Signed)
 Be Involved in Caring For Your Health:  Taking Medications When medications are taken as directed, they can greatly improve your health. But if they are not taken as prescribed, they may not work. In some cases, not taking them correctly can be harmful. To help ensure your treatment remains effective and safe, understand your medications and how to take them. Bring your medications to each visit for review by your provider.  Your lab results, notes, and after visit summary will be available on My Chart. We strongly encourage you to use this feature. If lab results are abnormal the clinic will contact you with the appropriate steps. If the clinic does not contact you assume the results are satisfactory. You can always view your results on My Chart. If you have questions regarding your health or results, please contact the clinic during office hours. You can also ask questions on My Chart.  We at [facility] are grateful that you chose [place] to provide your care. We strive to provide evidence-based and compassionate care and are always looking for feedback. If you get a survey from the clinic please complete this so we can hear your opinions.  Heart-Healthy Eating Plan Many factors influence your heart health, including eating and exercise habits. Heart health is also called coronary health. Coronary risk increases with abnormal blood fat (lipid) levels. A heart-healthy eating plan includes limiting unhealthy fats, increasing healthy fats, limiting salt (sodium) intake, and making other diet and lifestyle changes. What is my plan? Your health care provider may recommend that: You limit your fat intake to _________% or less of your total calories each day. You limit your saturated fat intake to _________% or less of your total calories each day. You limit the amount of cholesterol in your diet to less than _________ mg per day. You limit the amount of sodium in your diet to less than _________  mg per day. What are tips for following this plan? Cooking Cook foods using methods other than frying. Baking, boiling, grilling, and broiling are all good options. Other ways to reduce fat include: Removing the skin from poultry. Removing all visible fats from meats. Steaming vegetables in water or broth. Meal planning  At meals, imagine dividing your plate into fourths: Fill one-half of your plate with vegetables and green salads. Fill one-fourth of your plate with whole grains. Fill one-fourth of your plate with lean protein foods. Eat 2-4 cups of vegetables per day. One cup of vegetables equals 1 cup (91 g) broccoli or cauliflower florets, 2 medium carrots, 1 large bell pepper, 1 large sweet potato, 1 large tomato, 1 medium white potato, 2 cups (150 g) raw leafy greens. Eat 1-2 cups of fruit per day. One cup of fruit equals 1 small apple, 1 large banana, 1 cup (237 g) mixed fruit, 1 large orange,  cup (82 g) dried fruit, 1 cup (240 mL) 100% fruit juice. Eat more foods that contain soluble fiber. Examples include apples, broccoli, carrots, beans, peas, and barley. Aim to get 25-30 g of fiber per day. Increase your consumption of legumes, nuts, and seeds to 4-5 servings per week. One serving of dried beans or legumes equals  cup (90 g) cooked, 1 serving of nuts is  oz (12 almonds, 24 pistachios, or 7 walnut halves), and 1 serving of seeds equals  oz (8 g). Fats Choose healthy fats more often. Choose monounsaturated and polyunsaturated fats, such as olive and canola oils, avocado oil, flaxseeds, walnuts, almonds, and seeds. Eat  more omega-3 fats. Choose salmon, mackerel, sardines, tuna, flaxseed oil, and ground flaxseeds. Aim to eat fish at least 2 times each week. Check food labels carefully to identify foods with trans fats or high amounts of saturated fat. Limit saturated fats. These are found in animal products, such as meats, butter, and cream. Plant sources of saturated fats  include palm oil, palm kernel oil, and coconut oil. Avoid foods with partially hydrogenated oils in them. These contain trans fats. Examples are stick margarine, some tub margarines, cookies, crackers, and other baked goods. Avoid fried foods. General information Eat more home-cooked food and less restaurant, buffet, and fast food. Limit or avoid alcohol. Limit foods that are high in added sugar and simple starches such as foods made using white refined flour (white breads, pastries, sweets). Lose weight if you are overweight. Losing just 5-10% of your body weight can help your overall health and prevent diseases such as diabetes and heart disease. Monitor your sodium intake, especially if you have high blood pressure. Talk with your health care provider about your sodium intake. Try to incorporate more vegetarian meals weekly. What foods should I eat? Fruits All fresh, canned (in natural juice), or frozen fruits. Vegetables Fresh or frozen vegetables (raw, steamed, roasted, or grilled). Green salads. Grains Most grains. Choose whole wheat and whole grains most of the time. Rice and pasta, including brown rice and pastas made with whole wheat. Meats and other proteins Lean, well-trimmed beef, veal, pork, and lamb. Chicken and [place] without skin. All fish and shellfish. Wild duck, rabbit, pheasant, and venison. Egg whites or low-cholesterol egg substitutes. Dried beans, peas, lentils, and tofu. Seeds and most nuts. Dairy Low-fat or nonfat cheeses, including ricotta and mozzarella. Skim or 1% milk (liquid, powdered, or evaporated). Buttermilk made with low-fat milk. Nonfat or low-fat yogurt. Fats and oils Non-hydrogenated (trans-free) margarines. Vegetable oils, including soybean, sesame, sunflower, olive, avocado, peanut, safflower, corn, canola, and cottonseed. Salad dressings or mayonnaise made with a vegetable oil. Beverages Water (mineral or sparkling). Coffee and tea. Unsweetened ice  tea. Diet beverages. Sweets and desserts Sherbet, gelatin, and fruit ice. Small amounts of dark chocolate. Limit all sweets and desserts. Seasonings and condiments All seasonings and condiments. The items listed above may not be a complete list of foods and beverages you can eat. Contact a dietitian for more options. What foods should I avoid? Fruits Canned fruit in heavy syrup. Fruit in cream or butter sauce. Fried fruit. Limit coconut. Vegetables Vegetables cooked in cheese, cream, or butter sauce. Fried vegetables. Grains Breads made with saturated or trans fats, oils, or whole milk. Croissants. Sweet rolls. Donuts. High-fat crackers, such as cheese crackers and chips. Meats and other proteins Fatty meats, such as hot dogs, ribs, sausage, bacon, rib-eye roast or steak. High-fat deli meats, such as salami and bologna. Caviar. Domestic duck and goose. Organ meats, such as liver. Dairy Cream, sour cream, cream cheese, and creamed cottage cheese. Whole-milk cheeses. Whole or 2% milk (liquid, evaporated, or condensed). Whole buttermilk. Cream sauce or high-fat cheese sauce. Whole-milk yogurt. Fats and oils Meat fat, or shortening. Cocoa butter, hydrogenated oils, palm oil, coconut oil, palm kernel oil. Solid fats and shortenings, including bacon fat, salt pork, lard, and butter. Nondairy cream substitutes. Salad dressings with cheese or sour cream. Beverages Regular sodas and any drinks with added sugar. Sweets and desserts Frosting. Pudding. Cookies. Cakes. Pies. Milk chocolate or white chocolate. Buttered syrups. Full-fat ice cream or ice cream drinks. The items listed above may  not be a complete list of foods and beverages to avoid. Contact a dietitian for more information. Summary Heart-healthy meal planning includes limiting unhealthy fats, increasing healthy fats, limiting salt (sodium) intake and making other diet and lifestyle changes. Lose weight if you are overweight. Losing just  5-10% of your body weight can help your overall health and prevent diseases such as diabetes and heart disease. Focus on eating a balance of foods, including fruits and vegetables, low-fat or nonfat dairy, lean protein, nuts and legumes, whole grains, and heart-healthy oils and fats. This information is not intended to replace advice given to you by your health care provider. Make sure you discuss any questions you have with your health care provider. Document Revised: 03/22/2021 Document Reviewed: 03/22/2021 Elsevier Patient Education  2024 [employer].

## 2024-01-18 ENCOUNTER — Encounter: Payer: Self-pay | Admitting: Nurse Practitioner

## 2024-01-18 ENCOUNTER — Ambulatory Visit (INDEPENDENT_AMBULATORY_CARE_PROVIDER_SITE_OTHER): Admitting: Nurse Practitioner

## 2024-01-18 VITALS — BP 135/80 | HR 98 | Resp 18 | Ht 64.49 in | Wt 277.0 lb

## 2024-01-18 DIAGNOSIS — Z6841 Body Mass Index (BMI) 40.0 and over, adult: Secondary | ICD-10-CM | POA: Diagnosis not present

## 2024-01-18 DIAGNOSIS — N1831 Chronic kidney disease, stage 3a: Secondary | ICD-10-CM | POA: Diagnosis not present

## 2024-01-18 DIAGNOSIS — J45990 Exercise induced bronchospasm: Secondary | ICD-10-CM

## 2024-01-18 DIAGNOSIS — C50411 Malignant neoplasm of upper-outer quadrant of right female breast: Secondary | ICD-10-CM

## 2024-01-18 DIAGNOSIS — E063 Autoimmune thyroiditis: Secondary | ICD-10-CM

## 2024-01-18 DIAGNOSIS — I10 Essential (primary) hypertension: Secondary | ICD-10-CM

## 2024-01-18 DIAGNOSIS — Z170 Estrogen receptor positive status [ER+]: Secondary | ICD-10-CM

## 2024-01-18 DIAGNOSIS — E782 Mixed hyperlipidemia: Secondary | ICD-10-CM

## 2024-01-18 NOTE — Assessment & Plan Note (Signed)
 Chronic, stable.  BP close to goal in office and at goal at home.  Recommend she monitor BP at least a few mornings a week at home and document.  DASH diet at home.  Continue current medication regimen and adjust as needed.  Urine ALB 150 on check May 2024, will recheck today, continue Olmesartan  for kidney protection.  Labs today: CMP.  Return in 6 months.

## 2024-01-18 NOTE — Assessment & Plan Note (Signed)
 Refer to morbid obesity plan of care.

## 2024-01-18 NOTE — Assessment & Plan Note (Signed)
 Ongoing, continue supplement -- check CMP.

## 2024-01-18 NOTE — Progress Notes (Signed)
 BP 135/80 (BP Location: Left Arm, Patient Position: Sitting, Cuff Size: Large)   Pulse 98   Resp 18   Ht 5' 4.49 (1.638 m)   Wt 277 lb (125.6 kg)   SpO2 96%   BMI 46.83 kg/m    Subjective:    Patient ID: Kerry Phillips, female    DOB: August 23, 1955, 68 y.o.   MRN: [id number]  HPI: Kerry Phillips is a 68 y.o. female  Chief Complaint  Patient presents with   HTN/HLD    Does not check at home often and tends to never run into concerning numbers but can be high or low.    Breast Cancer    New mammo showed no signs of cancer.    HYPERTENSION & BREAST CANCER Continues Olmesartan -Amlodipine - HCTZ daily and Rosuvastatin .  Calcium  levels in 8-9 range, continues on supplements. Recent level was 8.9.  Takes Vitamin D  supplement at home.  Diagnosed with breast cancer on 01/06/23, Grade I Invasive Ductal Carcinoma to right breast. Had lumpectomy and radiation. Recent mammogram in June showed no further cancer.  Saw oncology last on 09/26/23. Takes Arimidex .  Hypertension status: controlled  Satisfied with current treatment? yes Duration of hypertension: chronic BP monitoring frequency: sometimes BP range: <130/80 BP medication side effects:  no Medication compliance: good compliance Aspirin: no Recurrent headaches: no Visual changes: no Palpitations: no Dyspnea: no Chest pain: no Lower extremity edema: no Dizzy/lightheaded: no  The 10-year ASCVD risk score (Arnett DK, et al., 2019) is: 9.6%   Values used to calculate the score:     Age: 42 years     Clincally relevant sex: Female     Is Non-Hispanic African American: Yes     Diabetic: No     Tobacco smoker: No     Systolic Blood Pressure: 135 mmHg     Is BP treated: No     HDL Cholesterol: 70 mg/dL     Total Cholesterol: 154 mg/dL  CHRONIC KIDNEY DISEASE (3a) CKD status: stable Medications renally dose: yes Previous renal evaluation: no Pneumovax:  refuses Influenza Vaccine:  refuses   ASTHMA Albuterol  as needed  only. Asthma status: stable Satisfied with current treatment?: yes Albuterol /rescue inhaler frequency: none recently Dyspnea frequency: no Wheezing frequency: no Cough frequency: occasional Nocturnal symptom frequency: no Limitation of activity: no Current upper respiratory symptoms: no Triggers: weather changes and exercise Aerochamber/spacer use: no Visits to ER or Urgent Care in past year: no Pneumovax: Not up to Date Influenza: Not up to Date   HYPOTHYROIDISM Continues Levothyroxine  100 MCG daily. Thyroid  control status:stable Satisfied with current treatment? yes Medication side effects: no Medication compliance: good compliance Etiology of hypothyroidism:  Recent dose adjustment: did adjust in April and repeat normal Fatigue: no Cold intolerance: no Heat intolerance: no Weight gain: no Weight loss: no Constipation: no Diarrhea/loose stools: no Palpitations: no Lower extremity edema: no Anxiety/depressed mood: no     01/18/2024   11:28 AM 09/26/2023   10:29 AM 07/18/2023    9:52 AM 04/27/2023    8:11 AM 01/18/2023   11:34 AM  Depression screen PHQ 2/9  Decreased Interest 0 0 0 0 0  Down, Depressed, Hopeless 0 0 0 0 0  PHQ - 2 Score 0 0 0 0 0  Altered sleeping 0  0    Tired, decreased energy 0  0    Change in appetite 0  0    Feeling bad or failure about yourself  0  0  Trouble concentrating 0  0    Moving slowly or fidgety/restless 0  0    Suicidal thoughts 0  0    PHQ-9 Score 0  0     Difficult doing work/chores Not difficult at all  Not difficult at all       Data saved with a previous flowsheet row definition       01/18/2024   11:28 AM 07/18/2023    9:53 AM 01/13/2023   10:16 AM 07/13/2022   10:12 AM  GAD 7 : Generalized Anxiety Score  Nervous, Anxious, on Edge 0 1 0 0  Control/stop worrying 0 0 0 0  Worry too much - different things 0 0 0 0  Trouble relaxing 0 0 0 0  Restless 0 0 0 0  Easily annoyed or irritable 0 0 0 0  Afraid - awful  might happen 0 0 0 0  Total GAD 7 Score 0 1 0 0  Anxiety Difficulty Not difficult at all Not difficult at all Not difficult at all    Relevant past medical, surgical, family and social history reviewed and updated as indicated. Interim medical history since our last visit reviewed. Allergies and medications reviewed and updated.  Review of Systems  Constitutional:  Negative for activity change, appetite change, diaphoresis, fatigue and fever.  Respiratory:  Negative for cough, chest tightness, shortness of breath and wheezing.   Cardiovascular:  Negative for chest pain, palpitations and leg swelling.  Gastrointestinal: Negative.   Endocrine: Negative for cold intolerance and heat intolerance.  Neurological: Negative.   Psychiatric/Behavioral:  Negative for decreased concentration, self-injury, sleep disturbance and suicidal ideas. The patient is not nervous/anxious.     Per HPI unless specifically indicated above     Objective:    BP 135/80 (BP Location: Left Arm, Patient Position: Sitting, Cuff Size: Large)   Pulse 98   Resp 18   Ht 5' 4.49 (1.638 m)   Wt 277 lb (125.6 kg)   SpO2 96%   BMI 46.83 kg/m   Wt Readings from Last 3 Encounters:  01/18/24 277 lb (125.6 kg)  10/23/23 278 lb 4 oz (126.2 kg)  09/26/23 278 lb 14.4 oz (126.5 kg)    Physical Exam Vitals and nursing note reviewed.  Constitutional:      General: She is awake. She is not in acute distress.    Appearance: She is well-developed and well-groomed. She is obese. She is not ill-appearing or toxic-appearing.  HENT:     Head: Normocephalic.     Right Ear: Hearing and external ear normal.     Left Ear: Hearing and external ear normal.  Eyes:     General: Lids are normal.        Right eye: No discharge.        Left eye: No discharge.     Conjunctiva/sclera: Conjunctivae normal.     Pupils: Pupils are equal, round, and reactive to light.  Neck:     Thyroid : No thyromegaly.     Vascular: No carotid bruit.   Cardiovascular:     Rate and Rhythm: Normal rate and regular rhythm.     Heart sounds: Normal heart sounds. No murmur heard.    No gallop.  Pulmonary:     Effort: Pulmonary effort is normal. No accessory muscle usage or respiratory distress.     Breath sounds: Normal breath sounds.  Abdominal:     General: Bowel sounds are normal. There is no distension.     Palpations:  Abdomen is soft.     Tenderness: There is no abdominal tenderness.  Musculoskeletal:     Cervical back: Normal range of motion and neck supple.     Right lower leg: No edema.     Left lower leg: No edema.  Lymphadenopathy:     Cervical: No cervical adenopathy.  Skin:    General: Skin is warm and dry.  Neurological:     Mental Status: She is alert and oriented to person, place, and time.     Deep Tendon Reflexes: Reflexes are normal and symmetric.     Reflex Scores:      Brachioradialis reflexes are 2+ on the right side and 2+ on the left side.      Patellar reflexes are 2+ on the right side and 2+ on the left side. Psychiatric:        Attention and Perception: Attention normal.        Mood and Affect: Mood normal.        Speech: Speech normal.        Behavior: Behavior normal. Behavior is cooperative.        Thought Content: Thought content normal.    Results for orders placed or performed in visit on 09/26/23  CMP (Cancer Center only)   Collection Time: 09/26/23  9:59 AM  Result Value Ref Range   Sodium 139 135 - 145 mmol/L   Potassium 3.8 3.5 - 5.1 mmol/L   Chloride 100 98 - 111 mmol/L   CO2 34 (H) 22 - 32 mmol/L   Glucose, Bld 91 70 - 99 mg/dL   BUN 15 8 - 23 mg/dL   Creatinine 8.97 (H) 9.55 - 1.00 mg/dL   Calcium  8.9 8.9 - 10.3 mg/dL   Total Protein 7.2 6.5 - 8.1 g/dL   Albumin 4.1 3.5 - 5.0 g/dL   AST 15 15 - 41 U/L   ALT 16 0 - 44 U/L   Alkaline Phosphatase 42 38 - 126 U/L   Total Bilirubin 0.5 0.0 - 1.2 mg/dL   GFR, Estimated 60 (L) >60 mL/min   Anion gap 5 5 - 15  CBC with Differential  (Cancer Center Only)   Collection Time: 09/26/23  9:59 AM  Result Value Ref Range   WBC Count 6.0 4.0 - 10.5 K/uL   RBC 4.24 3.87 - 5.11 MIL/uL   Hemoglobin 11.9 (L) 12.0 - 15.0 g/dL   HCT 64.3 (L) 63.9 - 53.9 %   MCV 84.0 80.0 - 100.0 fL   MCH 28.1 26.0 - 34.0 pg   MCHC 33.4 30.0 - 36.0 g/dL   RDW 86.2 88.4 - 84.4 %   Platelet Count 295 150 - 400 K/uL   nRBC 0.0 0.0 - 0.2 %   Neutrophils Relative % 68 %   Neutro Abs 4.1 1.7 - 7.7 K/uL   Lymphocytes Relative 20 %   Lymphs Abs 1.2 0.7 - 4.0 K/uL   Monocytes Relative 8 %   Monocytes Absolute 0.5 0.1 - 1.0 K/uL   Eosinophils Relative 3 %   Eosinophils Absolute 0.2 0.0 - 0.5 K/uL   Basophils Relative 1 %   Basophils Absolute 0.1 0.0 - 0.1 K/uL   Immature Granulocytes 0 %   Abs Immature Granulocytes 0.02 0.00 - 0.07 K/uL      Assessment & Plan:   Problem List Items Addressed This Visit       Cardiovascular and Mediastinum   Essential hypertension   Chronic, stable.  BP close to  goal in office and at goal at home.  Recommend she monitor BP at least a few mornings a week at home and document.  DASH diet at home.  Continue current medication regimen and adjust as needed.  Urine ALB 150 on check May 2024, will recheck today, continue Olmesartan  for kidney protection.  Labs today: CMP.  Return in 6 months.         Respiratory   Exercise-induced asthma   Stable, no recent Albuterol  use.  Recommend she use this as needed if notices wheezing + allergy medication regimen.        Endocrine   Hashimoto's thyroiditis   Chronic, ongoing.  Continue current medication regimen and adjust as needed.  Recheck labs at physical.        Genitourinary   Stage 3a chronic kidney disease (CKD) (HCC)   Stage 3a on labs, stable.  Continue Olmesartan  for kidney protection.  Educated her on kidney disease and risks for this.  Monitor closely.  Increase water intake.  Consider nephrology if worsening.        Other   Morbid obesity (HCC)   BMI  46.83 with HTN and HLD.  Recommended eating smaller high protein, low fat meals more frequently and exercising 30 mins a day 5 times a week with a goal of 10-15lb weight loss in the next 3 months. Patient voiced their understanding and motivation to adhere to these recommendations.       Mixed hyperlipidemia   Chronic, ongoing.  Tolerating Rosuvastatin .  Will continue this and adjust as needed.  Lipid panel today.      Relevant Orders   Comprehensive metabolic panel with GFR   Lipid Panel w/o Chol/HDL Ratio   Malignant neoplasm of upper-outer quadrant of right breast in female, estrogen receptor positive (HCC) - Primary   Following with oncology.  Will continue this collaboration and treatment as ordered by them.  Recent notes reviewed.      Hypocalcemia   Ongoing, continue supplement -- check CMP.      BMI 45.0-49.9, adult [facility])   Refer to morbid obesity plan of care.         Follow up plan: Return in about 6 months (around 07/17/2024) for Annual Physical -- after 07/17/24.

## 2024-01-18 NOTE — Progress Notes (Signed)
 Requested.

## 2024-01-18 NOTE — Assessment & Plan Note (Signed)
 Stable, no recent Albuterol  use.  Recommend she use this as needed if notices wheezing + allergy medication regimen.

## 2024-01-18 NOTE — Assessment & Plan Note (Signed)
 Following with oncology.  Will continue this collaboration and treatment as ordered by them.  Recent notes reviewed.

## 2024-01-18 NOTE — Assessment & Plan Note (Signed)
 BMI 46.83 with HTN and HLD.  Recommended eating smaller high protein, low fat meals more frequently and exercising 30 mins a day 5 times a week with a goal of 10-15lb weight loss in the next 3 months. Patient voiced their understanding and motivation to adhere to these recommendations.

## 2024-01-18 NOTE — Assessment & Plan Note (Signed)
 Stage 3a on labs, stable.  Continue Olmesartan  for kidney protection.  Educated her on kidney disease and risks for this.  Monitor closely.  Increase water intake.  Consider nephrology if worsening.

## 2024-01-18 NOTE — Assessment & Plan Note (Signed)
 Chronic, ongoing.  Continue current medication regimen and adjust as needed.  Recheck labs at physical.

## 2024-01-18 NOTE — Assessment & Plan Note (Signed)
 Chronic, ongoing.  Tolerating Rosuvastatin .  Will continue this and adjust as needed.  Lipid panel today.

## 2024-01-19 ENCOUNTER — Ambulatory Visit: Payer: Self-pay | Admitting: Nurse Practitioner

## 2024-01-19 LAB — LIPID PANEL W/O CHOL/HDL RATIO
Cholesterol, Total: 154 mg/dL (ref 100–199)
HDL: 62 mg/dL
LDL Chol Calc (NIH): 80 mg/dL (ref 0–99)
Triglycerides: 58 mg/dL (ref 0–149)
VLDL Cholesterol Cal: 12 mg/dL (ref 5–40)

## 2024-01-19 LAB — COMPREHENSIVE METABOLIC PANEL WITH GFR
ALT: 13 [IU]/L (ref 0–32)
AST: 19 [IU]/L (ref 0–40)
Albumin: 4.7 g/dL (ref 3.9–4.9)
Alkaline Phosphatase: 50 [IU]/L (ref 49–135)
BUN/Creatinine Ratio: 13 (ref 12–28)
BUN: 14 mg/dL (ref 8–27)
Bilirubin Total: 0.5 mg/dL (ref 0.0–1.2)
CO2: 22 mmol/L (ref 20–29)
Calcium: 9.6 mg/dL (ref 8.7–10.3)
Chloride: 96 mmol/L (ref 96–106)
Creatinine, Ser: 1.08 mg/dL — ABNORMAL HIGH (ref 0.57–1.00)
Globulin, Total: 2.6 g/dL (ref 1.5–4.5)
Glucose: 71 mg/dL (ref 70–99)
Potassium: 3.8 mmol/L (ref 3.5–5.2)
Sodium: 138 mmol/L (ref 134–144)
Total Protein: 7.3 g/dL (ref 6.0–8.5)
eGFR: 56 mL/min/{1.73_m2} — ABNORMAL LOW

## 2024-01-19 NOTE — Progress Notes (Signed)
 Contacted via MyChart  Good afternoon Kerry Phillips, your labs have returned: - Kidney function continues to show Stage 3a kidney disease with no worsening - creatinine and eGFR. - Liver function, AST and ALT, is normal. - Lipid panel shows stable levels, but would like to see LDL lower. Would you be okay with going up to 20 MG on your Rosuvastatin ? Let me know. Any questions? Keep being amazing!!  Thank you for allowing me to participate in your care.  I appreciate you. Kindest regards, Richetta Cubillos

## 2024-01-31 ENCOUNTER — Other Ambulatory Visit

## 2024-02-14 ENCOUNTER — Ambulatory Visit (HOSPITAL_BASED_OUTPATIENT_CLINIC_OR_DEPARTMENT_OTHER): Admission: RE | Admit: 2024-02-14 | Discharge: 2024-02-14 | Attending: Hematology and Oncology

## 2024-02-14 DIAGNOSIS — C50411 Malignant neoplasm of upper-outer quadrant of right female breast: Secondary | ICD-10-CM | POA: Insufficient documentation

## 2024-02-14 DIAGNOSIS — Z780 Asymptomatic menopausal state: Secondary | ICD-10-CM | POA: Insufficient documentation

## 2024-02-14 DIAGNOSIS — Z170 Estrogen receptor positive status [ER+]: Secondary | ICD-10-CM | POA: Insufficient documentation

## 2024-02-14 DIAGNOSIS — Z13820 Encounter for screening for osteoporosis: Secondary | ICD-10-CM | POA: Insufficient documentation

## 2024-02-19 ENCOUNTER — Ambulatory Visit: Attending: Surgery

## 2024-02-19 VITALS — Wt 279.0 lb

## 2024-02-19 DIAGNOSIS — Z483 Aftercare following surgery for neoplasm: Secondary | ICD-10-CM | POA: Insufficient documentation

## 2024-02-19 NOTE — Therapy (Addendum)
 " OUTPATIENT PHYSICAL THERAPY SOZO SCREENING NOTE   Patient Name: Kerry Phillips MRN: [id number] DOB:Sep 27, 1955, 68 y.o., female Today's Date: 02/19/2024  PCP: Valerio Melanie DASEN, NP REFERRING PROVIDER: Vanderbilt Ned, MD   PT End of Session - 02/19/24 1032     Visit Number 2   # unchanged due to screen only   PT Start Time 1030    PT Stop Time 1045    PT Time Calculation (min) 15 min    Activity Tolerance Patient tolerated treatment well    Behavior During Therapy [facility] for tasks assessed/performed          Past Medical History:  Diagnosis Date   Arthritis    Breast cancer (HCC)    right   Dysphagia 01/17/2017   History of blood transfusion    with childbirth   Hypertension    Hypothyroidism    surgical   Past Surgical History:  Procedure Laterality Date   ABDOMINAL HYSTERECTOMY     ARTHROSCOPIC HAGLUNDS REPAIR Right 01/04/2019   Procedure: ENDOSCOPIC RESECTION HAGLUNDS DEFORMITY;  Surgeon: Harden Jerona GAILS, MD;  Location: [facility] OR;  Service: Orthopedics;  Laterality: Right;   BREAST BIOPSY Right 01/06/2023   MM RT BREAST BX W LOC DEV 1ST LESION IMAGE BX SPEC STEREO GUIDE 01/06/2023 [facility]   BREAST BIOPSY Right 01/06/2023   MM RT BREAST BX W LOC DEV EA AD LESION IMG BX SPEC STEREO GUIDE 01/06/2023 [facility]   BREAST BIOPSY  03/08/2023   MM RT RADIOACTIVE SEED LOC MAMMO GUIDE 03/08/2023 [facility]   BREAST BIOPSY  03/08/2023   MM RT RADIOACTIVE SEED EA ADD LESION LOC MAMMO GUIDE 03/08/2023 [facility]   BREAST LUMPECTOMY WITH RADIOACTIVE SEED AND SENTINEL LYMPH NODE BIOPSY Right 03/09/2023   Procedure: RIGHT BREAST SEED LOCALIZED LUMPECTOMY BRACKETED WITH RIGHT SENTINEL LYMPH NODE MAPPING;  Surgeon: Vanderbilt Ned, MD;  Location: [facility] OR;  Service: General;  Laterality: Right;   BREAST RECONSTRUCTION Right 03/17/2023   Procedure: RIGHT ONCOPLASTIC BREAST RECONSTRUCTION;  Surgeon: Arelia Filippo, MD;  Location: [facility];  Service:  Plastics;  Laterality: Right;   BREAST REDUCTION SURGERY Left 03/17/2023   Procedure: LEFT MAMMARY REDUCTION  (BREAST);  Surgeon: Arelia Filippo, MD;  Location: [facility];  Service: Plastics;  Laterality: Left;   COLONOSCOPY     THYROIDECTOMY     90% removed   Patient Active Problem List   Diagnosis Date Noted   History of therapeutic radiation 09/13/2023   Gastroesophageal reflux disease 01/18/2023   History of colonic polyps 01/18/2023   Malignant neoplasm of upper-outer quadrant of right breast in female, estrogen receptor positive (HCC) 01/16/2023   No vaccination-pt refuse 01/08/2023   Exercise-induced asthma 01/06/2022   Stage 3a chronic kidney disease (CKD) (HCC) 07/25/2021   Mixed hyperlipidemia 10/15/2020   Vitamin D  deficiency 10/15/2020   Morbid obesity (HCC) 03/22/2018   Hypocalcemia 09/07/2017   BMI 45.0-49.9, adult (HCC) 04/05/2016   Arthritis of both knees 04/05/2016   Essential hypertension 12/22/2014   Hashimoto's thyroiditis 12/22/2014    REFERRING DIAG: right breast cancer at risk for lymphedema  THERAPY DIAG:  Aftercare following surgery for neoplasm  PERTINENT HISTORY:  Patient was diagnosed on 12/14/2022 with right grade I invasive ductal carcinoma breast cancer. It measures 15 cm and is located in the upper outer quadrant. It is ER/PR positive and HER2 negative with a Ki67 of 2%. 03/09/23- R lumpectomy and SLNB 0/7, 03/17/23- R oncoplastic reconstruction and L reduction  PRECAUTIONS: right UE Lymphedema risk, None  SUBJECTIVE: Pt returns for her 3 month L-Dex screen. I want you to order me another compression sleeve.  PAIN:  Are you having pain? No  SOZO SCREENING:  Patient was assessed today using the SOZO machine to determine the lymphedema index score. This was compared to her baseline score. It was determined that she is now within the recommended range when compared to her baseline. She has a compression sleeve and wanted to order  a second. Pulled up her original [employer] ordered and helped her place this before she left today It is recommended she return in 1 month to be reassessed. If she continues to measure outside the recommended range, physical therapy treatment will be recommended at that time and a referral requested.  Measured pt for new compression sleeve (Sigvaris Secure, size L2, black) and will place order through [facility].    L-DEX FLOWSHEETS - 02/19/24 1000       L-DEX LYMPHEDEMA SCREENING   Measurement Type Unilateral    L-DEX MEASUREMENT EXTREMITY Upper Extremity    POSITION  Standing    DOMINANT SIDE Right    At Risk Side Right    BASELINE SCORE (UNILATERAL) -4.5    L-DEX SCORE (UNILATERAL) -5.5    VALUE CHANGE (UNILAT) -1           P: Cont every 3 month L-Dex screens until 02/2025, then can transition to every 6 months until 03/2027.    Aden Berwyn Caldron, PTA 02/19/2024, 12:58 PM     "

## 2024-03-05 ENCOUNTER — Other Ambulatory Visit: Payer: Self-pay

## 2024-03-05 DIAGNOSIS — Z1379 Encounter for other screening for genetic and chromosomal anomalies: Secondary | ICD-10-CM

## 2024-03-06 ENCOUNTER — Other Ambulatory Visit: Payer: Self-pay

## 2024-03-07 ENCOUNTER — Telehealth: Payer: Self-pay | Admitting: Hematology and Oncology

## 2024-03-07 NOTE — Telephone Encounter (Signed)
 I LEFT VOICEMAIL FOR PATIENT REGARDING RESCHEDULED APPOINTMENT FROM 03/29/2024 TO 04/18/2024.

## 2024-03-29 ENCOUNTER — Ambulatory Visit: Admitting: Hematology and Oncology

## 2024-03-29 ENCOUNTER — Inpatient Hospital Stay

## 2024-04-18 ENCOUNTER — Inpatient Hospital Stay: Admitting: Hematology and Oncology

## 2024-04-18 ENCOUNTER — Inpatient Hospital Stay

## 2024-05-09 ENCOUNTER — Ambulatory Visit: Payer: Medicare Other

## 2024-05-20 ENCOUNTER — Ambulatory Visit

## 2024-07-18 ENCOUNTER — Encounter: Admitting: Nurse Practitioner

## 2024-07-24 ENCOUNTER — Encounter: Admitting: Nurse Practitioner
